# Patient Record
Sex: Male | Born: 1955 | Race: White | Hispanic: No | Marital: Married | Smoking: Never smoker
Health system: Southern US, Community
[De-identification: ages and names within clinical notes are randomized; demographics above are authoritative.]

## PROBLEM LIST (undated history)

## (undated) DIAGNOSIS — G4730 Sleep apnea, unspecified: Secondary | ICD-10-CM

## (undated) DIAGNOSIS — Z87898 Personal history of other specified conditions: Secondary | ICD-10-CM

## (undated) DIAGNOSIS — E781 Pure hyperglyceridemia: Secondary | ICD-10-CM

## (undated) DIAGNOSIS — E11329 Type 2 diabetes mellitus with mild nonproliferative diabetic retinopathy without macular edema: Secondary | ICD-10-CM

## (undated) DIAGNOSIS — I4891 Unspecified atrial fibrillation: Secondary | ICD-10-CM

## (undated) DIAGNOSIS — E669 Obesity, unspecified: Secondary | ICD-10-CM

## (undated) DIAGNOSIS — I10 Essential (primary) hypertension: Secondary | ICD-10-CM

## (undated) DIAGNOSIS — E119 Type 2 diabetes mellitus without complications: Secondary | ICD-10-CM

## (undated) DIAGNOSIS — T7840XA Allergy, unspecified, initial encounter: Secondary | ICD-10-CM

## (undated) HISTORY — DX: Essential (primary) hypertension: I10

## (undated) HISTORY — DX: Unspecified atrial fibrillation: I48.91

## (undated) HISTORY — DX: Pure hyperglyceridemia: E78.1

## (undated) HISTORY — DX: Type 2 diabetes mellitus with mild nonproliferative diabetic retinopathy without macular edema: E11.329

## (undated) HISTORY — DX: Sleep apnea, unspecified: G47.30

## (undated) HISTORY — DX: Obesity, unspecified: E66.9

## (undated) HISTORY — DX: Type 2 diabetes mellitus without complications: E11.9

## (undated) HISTORY — DX: Personal history of other specified conditions: Z87.898

## (undated) HISTORY — DX: Allergy, unspecified, initial encounter: T78.40XA

---

## 1976-11-28 HISTORY — PX: KNEE ARTHROTOMY: SUR107

## 1997-11-28 HISTORY — PX: NASAL SEPTOPLASTY W/ TURBINOPLASTY: SHX2070

## 1999-01-19 ENCOUNTER — Other Ambulatory Visit: Admission: RE | Admit: 1999-01-19 | Discharge: 1999-01-19 | Payer: Self-pay | Admitting: Otolaryngology

## 2002-01-04 ENCOUNTER — Encounter: Payer: Self-pay | Admitting: Gastroenterology

## 2002-01-04 ENCOUNTER — Ambulatory Visit (HOSPITAL_COMMUNITY): Admission: RE | Admit: 2002-01-04 | Discharge: 2002-01-04 | Payer: Self-pay | Admitting: Gastroenterology

## 2003-11-29 HISTORY — PX: OTHER SURGICAL HISTORY: SHX169

## 2014-05-29 ENCOUNTER — Encounter: Payer: Self-pay | Admitting: *Deleted

## 2014-05-29 ENCOUNTER — Ambulatory Visit (INDEPENDENT_AMBULATORY_CARE_PROVIDER_SITE_OTHER): Payer: 59 | Admitting: Cardiology

## 2014-05-29 VITALS — BP 140/96 | HR 74 | Wt 236.0 lb

## 2014-05-29 DIAGNOSIS — I480 Paroxysmal atrial fibrillation: Secondary | ICD-10-CM

## 2014-05-29 DIAGNOSIS — E785 Hyperlipidemia, unspecified: Secondary | ICD-10-CM

## 2014-05-29 DIAGNOSIS — I10 Essential (primary) hypertension: Secondary | ICD-10-CM

## 2014-05-29 DIAGNOSIS — I4891 Unspecified atrial fibrillation: Secondary | ICD-10-CM

## 2014-05-29 DIAGNOSIS — G4733 Obstructive sleep apnea (adult) (pediatric): Secondary | ICD-10-CM

## 2014-05-29 LAB — BASIC METABOLIC PANEL
BUN: 19 mg/dL (ref 6–23)
CO2: 29 mEq/L (ref 19–32)
Calcium: 9.6 mg/dL (ref 8.4–10.5)
Chloride: 100 mEq/L (ref 96–112)
Creatinine, Ser: 1.2 mg/dL (ref 0.4–1.5)
GFR: 63.7 mL/min (ref >60.00)
Glucose, Bld: 111 mg/dL — ABNORMAL HIGH (ref 70–99)
Potassium: 3.4 mEq/L — ABNORMAL LOW (ref 3.5–5.1)
Sodium: 139 mEq/L (ref 135–145)

## 2014-05-29 LAB — LIPID PANEL
Cholesterol: 161 mg/dL (ref 0–200)
HDL: 42.4 mg/dL (ref >39.00)
LDL Cholesterol: 78 mg/dL (ref 0–99)
NonHDL: 118.6
Total CHOL/HDL Ratio: 4
Triglycerides: 205 mg/dL — ABNORMAL HIGH (ref 0.0–149.0)
VLDL: 41 mg/dL — ABNORMAL HIGH (ref 0.0–40.0)

## 2014-05-29 LAB — CBC WITH DIFFERENTIAL/PLATELET
Basophils Absolute: 0 10*3/uL (ref 0.0–0.1)
Basophils Relative: 0.5 % (ref 0.0–3.0)
Eosinophils Absolute: 0.2 10*3/uL (ref 0.0–0.7)
Eosinophils Relative: 1.9 % (ref 0.0–5.0)
HCT: 41 % (ref 39.0–52.0)
Hemoglobin: 13.9 g/dL (ref 13.0–17.0)
Lymphocytes Relative: 43 % (ref 12.0–46.0)
Lymphs Abs: 3.6 10*3/uL (ref 0.7–4.0)
MCHC: 33.9 g/dL (ref 30.0–36.0)
MCV: 92.4 fl (ref 78.0–100.0)
Monocytes Absolute: 0.6 10*3/uL (ref 0.1–1.0)
Monocytes Relative: 7 % (ref 3.0–12.0)
Neutro Abs: 4 10*3/uL (ref 1.4–7.7)
Neutrophils Relative %: 47.6 % (ref 43.0–77.0)
Platelets: 269 10*3/uL (ref 150.0–400.0)
RBC: 4.44 Mil/uL (ref 4.22–5.81)
RDW: 13.5 % (ref 11.5–15.5)
WBC: 8.3 10*3/uL (ref 4.0–10.5)

## 2014-05-29 LAB — MAGNESIUM: Magnesium: 1.9 mg/dL (ref 1.5–2.5)

## 2014-05-29 MED ORDER — APIXABAN 5 MG PO TABS: 5.0000 mg | ORAL_TABLET | Freq: Two times a day (BID) | ORAL | Status: DC

## 2014-05-29 NOTE — Patient Instructions (Signed)
You can change from Xarelto to Eliquis 5 mg two times a day. Take Eliquis the next day after you take your last Xarelto.   Your physician recommends that you return for lab work today--BMET/Magnesium/CBCd/Lipid profile.  Your physician wants you to follow-up in: 6 months with Dr Shirlee LatchMcLean. (January 2016).  You will receive a reminder letter in the mail two months in advance. If you don't receive a letter, please call our office to schedule the follow-up appointment.

## 2014-05-30 DIAGNOSIS — I480 Paroxysmal atrial fibrillation: Secondary | ICD-10-CM | POA: Insufficient documentation

## 2014-05-30 DIAGNOSIS — E785 Hyperlipidemia, unspecified: Secondary | ICD-10-CM | POA: Insufficient documentation

## 2014-05-30 DIAGNOSIS — I10 Essential (primary) hypertension: Secondary | ICD-10-CM | POA: Insufficient documentation

## 2014-05-30 DIAGNOSIS — G4733 Obstructive sleep apnea (adult) (pediatric): Secondary | ICD-10-CM | POA: Insufficient documentation

## 2014-05-30 NOTE — Progress Notes (Signed)
Patient ID: George Carr, male   DOB: September 21, 1956, 58 y.o.   MRN: [id number] PCP: Dr. Leonette MostKalish [facility]  58 yo with history of type II diabetes, HTN, and paroxysmal atrial fibrillation.  Atrial fibrillation was first noted in 2/15 when he went for a colonoscopy.  He was unaware of it, no symptoms.  He was started on sotalol and Xarelto.  He went back into NSR and is in NSR today.  He denies exertional dyspnea or chest pain.  He has good exercise tolerance.  He tries to walk for exercise as much as he can.  He has OSA and is using CPAP.  Echo was done in 3/15, showing preserved LV systolic function.  Lexiscan Cardiolite was done in 3/15 showing no ischemia.  He says that Xarelto is quite expensive for him.    ECG: NSR, nonspecific inferior T wave flattening, QTc 468 msec  PMH:  1. Type II diabetes 2. HTN 3. Obesity 4. Hyperlipidemia 5. Atrial fibrillation: paroxysmal.  1st noted in 2/15 when he went for a colonoscopy.  He was started on sotalol and Xarelto.  Echo (3/15) with EF 60%, mild LVH, mild AI.   6. OSA on CPAP 7. Lexiscan Cardiolite (3/15) with EF 59%, no ischemia.   FH: Father with CAD, atrial fibrillation.   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    ROS: All systems reviewed and negative except as per HPI.   Current Outpatient Prescriptions  Medication Sig Dispense Refill  . apixaban (ELIQUIS) 5 MG TABS tablet Take 1 tablet (5 mg total) by mouth 2 (two) times daily.  60 tablet  3  . lisinopril-hydrochlorothiazide (PRINZIDE,ZESTORETIC) 20-12.5 MG per tablet Take 2 tablets by mouth daily.      . metFORMIN (GLUCOPHAGE-XR) 500 MG 24 hr tablet Take 2,000 mg by mouth daily with breakfast.      . sotalol (BETAPACE) 80 MG tablet Take 80 mg by mouth 2 (two) times daily.       No current facility-administered medications for this visit.   BP 140/96  Pulse 74  Wt 107.049 kg (236 lb) General: NAD, overweight Neck: Thick, no JVD, no thyromegaly or  thyroid nodule.  Lungs: Clear to auscultation bilaterally with normal respiratory effort. CV: Nondisplaced PMI.  Heart regular S1/S2, no S3/S4, no murmur.  No peripheral edema.  No carotid bruit.  Normal pedal pulses.  Abdomen: Soft, nontender, no hepatosplenomegaly, no distention.  Skin: Intact without lesions or rashes.  Neurologic: Alert and oriented x 3.  Psych: Normal affect. Extremities: No clubbing or cyanosis.  HEENT: Normal.   Assessment/Plan: 1. Paroxysmal atrial fibrillation: Patient is in NSR today on sotalol.  He was not particularly symptomatic when in atrial fibrillation.  Multiple atrial fibrillation risk factors including HTN, diabetes, and OSA.  - Continue sotalol for NSR maintenance.  QT interval mildly prolonged today but ok.  Will check K and Mg today.   - Xarelto is expensive for him.  We checked with his insurance, and Eliquis is significantly cheaper.  I will have him transition to Eliquis.   2. HTN: BP mildly elevated today, will follow for now but may need to add amlodipine.  3. Hyperlipidemia: Probably would benefit from a statin.  I will check lipids today and decide.   4. OSA: I encouraged CPAP use as this can help lower risk of atrial fibrillation recurrence potentially.   Marca AnconaDalton McLean 05/30/2014

## 2014-06-02 ENCOUNTER — Other Ambulatory Visit: Payer: Self-pay | Admitting: *Deleted

## 2014-06-02 DIAGNOSIS — E785 Hyperlipidemia, unspecified: Secondary | ICD-10-CM

## 2014-06-02 DIAGNOSIS — Z79899 Other long term (current) drug therapy: Secondary | ICD-10-CM

## 2014-06-02 DIAGNOSIS — I10 Essential (primary) hypertension: Secondary | ICD-10-CM

## 2014-06-02 MED ORDER — POTASSIUM CHLORIDE CRYS ER 20 MEQ PO TBCR: 20.0000 meq | EXTENDED_RELEASE_TABLET | Freq: Every day | ORAL | Status: DC

## 2014-06-02 MED ORDER — ATORVASTATIN CALCIUM 10 MG PO TABS: ORAL_TABLET | ORAL | Status: DC

## 2014-07-25 ENCOUNTER — Ambulatory Visit (INDEPENDENT_AMBULATORY_CARE_PROVIDER_SITE_OTHER): Payer: 59 | Admitting: Cardiology

## 2014-07-25 ENCOUNTER — Encounter: Payer: Self-pay | Admitting: Cardiology

## 2014-07-25 VITALS — BP 116/72 | HR 76 | Ht 72.0 in | Wt 235.0 lb

## 2014-07-25 DIAGNOSIS — R55 Syncope and collapse: Secondary | ICD-10-CM

## 2014-07-25 DIAGNOSIS — G4733 Obstructive sleep apnea (adult) (pediatric): Secondary | ICD-10-CM

## 2014-07-25 DIAGNOSIS — I4891 Unspecified atrial fibrillation: Secondary | ICD-10-CM

## 2014-07-25 DIAGNOSIS — Z7901 Long term (current) use of anticoagulants: Secondary | ICD-10-CM

## 2014-07-25 DIAGNOSIS — I480 Paroxysmal atrial fibrillation: Secondary | ICD-10-CM

## 2014-07-25 LAB — BASIC METABOLIC PANEL
BUN: 17 mg/dL (ref 6–23)
CHLORIDE: 101 meq/L (ref 96–112)
CO2: 32 meq/L (ref 19–32)
CREATININE: 0.9 mg/dL (ref 0.4–1.5)
Calcium: 9.7 mg/dL (ref 8.4–10.5)
GFR: 89.85 mL/min (ref >60.00)
Glucose, Bld: 130 mg/dL — ABNORMAL HIGH (ref 70–99)
Potassium: 3.7 mEq/L (ref 3.5–5.1)
Sodium: 139 mEq/L (ref 135–145)

## 2014-07-25 LAB — CBC WITH DIFFERENTIAL/PLATELET
BASOS PCT: 0.6 % (ref 0.0–3.0)
Basophils Absolute: 0 10*3/uL (ref 0.0–0.1)
EOS ABS: 0.1 10*3/uL (ref 0.0–0.7)
Eosinophils Relative: 1.9 % (ref 0.0–5.0)
HCT: 43.7 % (ref 39.0–52.0)
Hemoglobin: 14.5 g/dL (ref 13.0–17.0)
LYMPHS PCT: 37.9 % (ref 12.0–46.0)
Lymphs Abs: 2.9 10*3/uL (ref 0.7–4.0)
MCHC: 33.3 g/dL (ref 30.0–36.0)
MCV: 93 fl (ref 78.0–100.0)
MONOS PCT: 8 % (ref 3.0–12.0)
Monocytes Absolute: 0.6 10*3/uL (ref 0.1–1.0)
NEUTROS PCT: 51.6 % (ref 43.0–77.0)
Neutro Abs: 3.9 10*3/uL (ref 1.4–7.7)
Platelets: 248 10*3/uL (ref 150.0–400.0)
RBC: 4.7 Mil/uL (ref 4.22–5.81)
RDW: 13.1 % (ref 11.5–15.5)
WBC: 7.6 10*3/uL (ref 4.0–10.5)

## 2014-07-25 NOTE — Patient Instructions (Signed)
**Note De-Identified Maylea Soria Obfuscation** Your physician recommends that you continue on your current medications as directed. Please refer to the Current Medication list given to you today.  Your physician has recommended that you wear an event monitor for 21 days. Event monitors are medical devices that record the heart's electrical activity. Doctors most often [name] these monitors to diagnose arrhythmias. Arrhythmias are problems with the speed or rhythm of the heartbeat. The monitor is a small, portable device. You can wear one while you do your normal daily activities. This is usually used to diagnose what is causing palpitations/syncope (passing out).  Your physician recommends that you schedule a follow-up appointment in: we will contact you concerning your follow up appointment.

## 2014-07-25 NOTE — Assessment & Plan Note (Addendum)
Today is the first time his atrial fibrillation has been documented since his event earlier this year before he was placed on sotalol. We really do not know if he goes in and out of atrial fibrillation. Event recorder will be done to assess this further.

## 2014-07-25 NOTE — Assessment & Plan Note (Signed)
The patient is anticoagulated. This will be continued.

## 2014-07-25 NOTE — Assessment & Plan Note (Signed)
The patient had an episode of sudden weakness and slumping to the floor. He did not have to syncope. He did not feel any palpitations. He did not have chest pain. EKG reveals atrial fib. This is the first time this has been documented since he spontaneously converted earlier this year on sotalol. He has normal LV function and no ischemia by nuclear scan within the past 6 months. He on the the vent today is not clear. It is possible that he could have pauses going in and out of atrial fibrillation. It is also possible that he could have a drug related proarrhythmia. We know that his glucose was stable today. Chemistry will be checked immediately today. In addition a 21 day event recorder will be placed. He knows to contact us if he has any recurring symptoms. I will arrange for very early followup with Dr. Shirlee Latch. It is possible that cardioversion might be considered if he still has atrial fibrillation.

## 2014-07-25 NOTE — Progress Notes (Signed)
Patient ID: George Carr, male   DOB: 1956/03/06, 58 y.o.   MRN: [id number]    HPI  Patient is seen today as an add-on. His primary care physician called me about him today. The patient is known to Dr. Shirlee Latch. He was seen in the office last May 29, 2014. He has diabetes hypertension and paroxysmal atrial fibrillation. He has been treated with sotalol. He is also on Eliquis. He has obstructive sleep apnea on CPAP. He has had an echo and a nuclear scan in the last 6 months. These showed no ischemia. There is good LV function.  Today he was feeling well. He ate breakfast. He stood up to walk across the kitchen. He noticed that he he did not feel right and went to hold on to a structure but weekend and felt himself fall to the floor. He did not have complete syncope. He knew that he was on the floor. Within a minute or 2 he felt fine. He did not notice any significant palpitations. There was no chest pain.  Allergies  Allergen Reactions  . Penicillins     Current Outpatient Prescriptions  Medication Sig Dispense Refill  . apixaban (ELIQUIS) 5 MG TABS tablet Take 1 tablet (5 mg total) by mouth 2 (two) times daily.  60 tablet  3  . atorvastatin (LIPITOR) 10 MG tablet Pt is to take 5 mg (1/2 tablet) by mouth once daily.  30 tablet  3  . lisinopril-hydrochlorothiazide (PRINZIDE,ZESTORETIC) 20-12.5 MG per tablet Take 2 tablets by mouth daily.      . metFORMIN (GLUCOPHAGE-XR) 500 MG 24 hr tablet Take 2,000 mg by mouth daily with breakfast.      . potassium chloride SA (K-DUR,KLOR-CON) 20 MEQ tablet Take 1 tablet (20 mEq total) by mouth daily.  30 tablet  6  . sotalol (BETAPACE) 80 MG tablet Take 80 mg by mouth 2 (two) times daily.       No current facility-administered medications for this visit.    History   Social History  . Marital Status: Married    Spouse Name: N/A    Number of Children: N/A  . Years of Education: N/A   Occupational History  . Not on file.   Social History Main  Topics  . Smoking status: Passive Smoke Exposure - Never Smoker  . Smokeless tobacco: Not on file  . Alcohol Use: Yes     Comment: seldom  . Drug Use: No  . Sexual Activity: Not on file   Other Topics Concern  . Not on file   Social History Narrative  . No narrative on file    Family History  Problem Relation Age of Onset  . Diabetes    . Hypertension Father   . Other Father     pat/mat uncles, malignant neoplasm of the prostate gland  . Stroke Father     Past Medical History  Diagnosis Date  . Atrial fibrillation   . Essential hypertension, benign   . Type II or unspecified type diabetes mellitus without mention of complication, not stated as uncontrolled   . 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 Obesity, unspecified   . Pure hyperglyceridemia   . Personal history of other specified diseases(V13.89)     Past Surgical History  Procedure Laterality Date  . Knee arthrotomy Right 1978  . Cornea radial keratotomy Bilateral 2005  . Nasal septoplasty w/ turbinoplasty  1999    Patient Active Problem List   Diagnosis Date Noted  .  Chronic anticoagulation 07/25/2014  . Paroxysmal atrial fibrillation 05/30/2014  . Essential hypertension 05/30/2014  . Hyperlipidemia 05/30/2014  . OSA (obstructive sleep apnea) 05/30/2014    ROS   Patient denies fever, chills, headache, sweats, rash, change in vision, change in hearing, chest pain, cough, nausea vomiting, urinary symptoms. All other systems are reviewed and are negative.  PHYSICAL EXAM  Patient is oriented to person time and place. Affect is normal. There is no jugulovenous distention. Head is atraumatic. Sclera and conjunctiva are normal. Lungs are clear. Respiratory effort is nonlabored. Cardiac exam reveals S1 and S2. The abdomen is soft. There is no peripheral edema. There no musculoskeletal deformities. There are no skin rashes. The rhythm is irregularly irregular.  Filed Vitals:   07/25/14 1432    BP: 116/72  Pulse: 76  Height: 6' (1.829 m)  Weight: 235 lb (106.595 kg)   I have reviewed the EKG done at his primary care office. There is atrial fibrillation. Rate is 95-100.  ASSESSMENT & PLAN

## 2014-07-28 ENCOUNTER — Encounter (INDEPENDENT_AMBULATORY_CARE_PROVIDER_SITE_OTHER): Payer: 59

## 2014-07-28 ENCOUNTER — Encounter: Payer: Self-pay | Admitting: *Deleted

## 2014-07-28 DIAGNOSIS — I4891 Unspecified atrial fibrillation: Secondary | ICD-10-CM

## 2014-07-28 DIAGNOSIS — R55 Syncope and collapse: Secondary | ICD-10-CM

## 2014-07-28 DIAGNOSIS — R42 Dizziness and giddiness: Secondary | ICD-10-CM

## 2014-07-28 NOTE — Progress Notes (Signed)
Patient ID: George Carr, male   DOB: 11/30/1955, 58 y.o.   MRN: [id number] E-Cardio verite 21 day cardiac event monitor applied to patient.

## 2014-08-01 ENCOUNTER — Encounter: Payer: Self-pay | Admitting: Cardiology

## 2014-08-01 ENCOUNTER — Ambulatory Visit (INDEPENDENT_AMBULATORY_CARE_PROVIDER_SITE_OTHER): Payer: 59 | Admitting: Cardiology

## 2014-08-01 VITALS — BP 138/90 | HR 62 | Ht 72.0 in | Wt 237.8 lb

## 2014-08-01 DIAGNOSIS — E785 Hyperlipidemia, unspecified: Secondary | ICD-10-CM

## 2014-08-01 DIAGNOSIS — I10 Essential (primary) hypertension: Secondary | ICD-10-CM

## 2014-08-01 DIAGNOSIS — I480 Paroxysmal atrial fibrillation: Secondary | ICD-10-CM

## 2014-08-01 DIAGNOSIS — R55 Syncope and collapse: Secondary | ICD-10-CM

## 2014-08-01 DIAGNOSIS — G4733 Obstructive sleep apnea (adult) (pediatric): Secondary | ICD-10-CM

## 2014-08-01 DIAGNOSIS — I4891 Unspecified atrial fibrillation: Secondary | ICD-10-CM

## 2014-08-01 NOTE — Patient Instructions (Signed)
Your physician recommends that you schedule a follow-up appointment in: 1 month with Dr. Shirlee Latch.

## 2014-08-03 NOTE — Progress Notes (Signed)
Patient ID: George Carr, male   DOB: 10/28/1956, 58 y.o.   MRN: [id number] PCP: Dr. Leonette Most [facility])  58 yo with history of type II diabetes, HTN, and paroxysmal atrial fibrillation.  Atrial fibrillation was first noted in 2/15 when he went for a colonoscopy.  He was unaware of it, no symptoms.  He was started on sotalol and Xarelto, later switched to Eliquis.  He went back into NSR.  He denies exertional dyspnea or chest pain.  He has good exercise tolerance.  He tries to walk for exercise as much as he can.  He has OSA and is using CPAP.  Echo was done in 3/15, showing preserved LV systolic function.  Lexiscan Cardiolite was done in 3/15 showing no ischemia.     Patient was doing well until 8/28.  On that day, he had a "weak spell" while walking through his kitchen.  He did not pass out completely but felt weak and lightheaded and slumped down to the floor.  He did not feel chest pain or palpitations.  He felt back to normal after about 30 seconds.  He has not had any further episodes like this.  He was seen in this office in on 8/28 after the "spell."  He was noted to be in atrial fibrillation with rate control.  He did not feel the atrial fibrillation.  Today, he is back in NSR.  He cannot tell when he went back into NSR.  He was set up for a 3 week monitor which he is wearing today.   ECG: NSR, nonspecific T wave flattening, QTc normal  Labs (8/15): K 3.7, creatinine 0.9, LDL 78, HDL 42  PMH:  1. Type II diabetes 2. HTN 3. Obesity 4. Hyperlipidemia 5. Atrial fibrillation: paroxysmal.  1st noted in 2/15 when he went for a colonoscopy.  He was started on sotalol and Xarelto.  Echo (3/15) with EF 60%, mild LVH, mild AI.   6. OSA on CPAP 7. Lexiscan Cardiolite (3/15) with EF 59%, no ischemia.  8. Presyncope (8/15)  FH: Father with CAD, atrial fibrillation.   SH: Married, 1 son, lives in [place], [place], works in Airline pilot.    ROS: All systems reviewed and negative except as per  HPI.   Current Outpatient Prescriptions  Medication Sig Dispense Refill  . apixaban (ELIQUIS) 5 MG TABS tablet Take 1 tablet (5 mg total) by mouth 2 (two) times daily.  60 tablet  3  . atorvastatin (LIPITOR) 10 MG tablet Pt is to take 5 mg (1/2 tablet) by mouth once daily.  30 tablet  3  . lisinopril-hydrochlorothiazide (PRINZIDE,ZESTORETIC) 20-12.5 MG per tablet Take 2 tablets by mouth daily.      . metFORMIN (GLUCOPHAGE-XR) 500 MG 24 hr tablet Take 2,000 mg by mouth daily with breakfast.      . potassium chloride SA (K-DUR,KLOR-CON) 20 MEQ tablet Take 1 tablet (20 mEq total) by mouth daily.  30 tablet  6  . sotalol (BETAPACE) 80 MG tablet Take 80 mg by mouth 2 (two) times daily.       No current facility-administered medications for this visit.   BP 138/90  Pulse 62  Ht 6' (1.829 m)  Wt 237 lb 12.8 oz (107.865 kg)  BMI 32.24 kg/m2 General: NAD, overweight Neck: Thick, no JVD, no thyromegaly or thyroid nodule.  Lungs: Clear to auscultation bilaterally with normal respiratory effort. CV: Nondisplaced PMI.  Heart regular S1/S2, no S3/S4, no murmur.  No peripheral edema.  No carotid bruit.  Normal pedal pulses.  Abdomen: Soft, nontender, no hepatosplenomegaly, no distention.  Skin: Intact without lesions or rashes.  Neurologic: Alert and oriented x 3.  Psych: Normal affect. Extremities: No clubbing or cyanosis.   Assessment/Plan: 1. Paroxysmal atrial fibrillation: Patient is back in NSR today after recent recurrence of atrial fibrillation.  The presyncopal spell could be related to post-termination pauses when he goes from atrial fibrillation to NSR.  Multiple atrial fibrillation risk factors including HTN, diabetes, and OSA.  - Continue sotalol for NSR maintenance.  QTc is not prolonged today. - Continue Eliquis.  - If he has more breakthrough atrial fibrillation while on sotalol, can consider atrial fibrillation ablation.  2. HTN: BP controlled. 3. Hyperlipidemia: Continue  atorvastatin with lipids/LFTs in 2 months.    4. OSA: I encouraged CPAP use as this can help lower risk of atrial fibrillation recurrence potentially.  5. Presyncope: Patient had one rather severe presyncopal spell last week.  He was in atrial fibrillation when he came to the office afterwards, but rate was controlled and this did not explain the presyncope.  Possibilities include post-termination pause when going from atrial fibrillation to NSR or pro-arrhythmia from sotalol with torsades.  Electrolytes and QT interval by ECG, however, have been ok.  - Continue 3 week monitor.  No worrisome events so far.   Followup in 1 month after monitor is complete.   Marca Ancona 08/03/2014

## 2014-08-07 ENCOUNTER — Encounter: Payer: Self-pay | Admitting: Cardiology

## 2014-08-08 ENCOUNTER — Other Ambulatory Visit (INDEPENDENT_AMBULATORY_CARE_PROVIDER_SITE_OTHER): Payer: 59

## 2014-08-08 DIAGNOSIS — I10 Essential (primary) hypertension: Secondary | ICD-10-CM

## 2014-08-08 DIAGNOSIS — Z79899 Other long term (current) drug therapy: Secondary | ICD-10-CM

## 2014-08-08 DIAGNOSIS — E785 Hyperlipidemia, unspecified: Secondary | ICD-10-CM

## 2014-08-08 LAB — HEPATIC FUNCTION PANEL
ALBUMIN: 4.1 g/dL (ref 3.5–5.2)
ALK PHOS: 47 U/L (ref 39–117)
ALT: 28 U/L (ref 0–53)
AST: 21 U/L (ref 0–37)
Bilirubin, Direct: 0.1 mg/dL (ref 0.0–0.3)
Total Bilirubin: 0.7 mg/dL (ref 0.2–1.2)
Total Protein: 7.3 g/dL (ref 6.0–8.3)

## 2014-08-08 LAB — LIPID PANEL
Cholesterol: 105 mg/dL (ref 0–200)
HDL: 32.6 mg/dL — ABNORMAL LOW (ref >39.00)
LDL Cholesterol: 51 mg/dL (ref 0–99)
NonHDL: 72.4
TRIGLYCERIDES: 108 mg/dL (ref 0.0–149.0)
Total CHOL/HDL Ratio: 3
VLDL: 21.6 mg/dL (ref 0.0–40.0)

## 2014-08-08 LAB — BASIC METABOLIC PANEL
BUN: 14 mg/dL (ref 6–23)
CALCIUM: 9.4 mg/dL (ref 8.4–10.5)
CO2: 29 mEq/L (ref 19–32)
CREATININE: 0.8 mg/dL (ref 0.4–1.5)
Chloride: 100 mEq/L (ref 96–112)
GFR: 104.06 mL/min (ref >60.00)
Glucose, Bld: 112 mg/dL — ABNORMAL HIGH (ref 70–99)
Potassium: 3.9 mEq/L (ref 3.5–5.1)
Sodium: 137 mEq/L (ref 135–145)

## 2014-08-11 ENCOUNTER — Telehealth: Payer: Self-pay | Admitting: Cardiology

## 2014-08-11 NOTE — Telephone Encounter (Signed)
Follow up;   Per pt calling this office back for his lab results.

## 2014-08-11 NOTE — Telephone Encounter (Signed)
Spoke with patient about recent lab results 

## 2014-08-12 ENCOUNTER — Telehealth: Payer: Self-pay | Admitting: *Deleted

## 2014-08-12 NOTE — Telephone Encounter (Signed)
Run of At Fib 08/10/14 11:30 AM (CT) per Dr Shirlee Latch  Refer to EP for ablation consideration.   Pt advised, verbalized understanding.

## 2014-09-08 ENCOUNTER — Ambulatory Visit (INDEPENDENT_AMBULATORY_CARE_PROVIDER_SITE_OTHER): Payer: 59 | Admitting: Internal Medicine

## 2014-09-08 ENCOUNTER — Encounter: Payer: Self-pay | Admitting: Internal Medicine

## 2014-09-08 VITALS — BP 146/98 | HR 60 | Ht 72.0 in | Wt 235.0 lb

## 2014-09-08 DIAGNOSIS — G4733 Obstructive sleep apnea (adult) (pediatric): Secondary | ICD-10-CM

## 2014-09-08 DIAGNOSIS — R55 Syncope and collapse: Secondary | ICD-10-CM

## 2014-09-08 DIAGNOSIS — I10 Essential (primary) hypertension: Secondary | ICD-10-CM

## 2014-09-08 DIAGNOSIS — I480 Paroxysmal atrial fibrillation: Secondary | ICD-10-CM

## 2014-09-08 MED ORDER — SOTALOL HCL 80 MG PO TABS: 120.0000 mg | ORAL_TABLET | Freq: Two times a day (BID) | ORAL | Status: DC

## 2014-09-08 MED ORDER — LISINOPRIL 20 MG PO TABS: 20.0000 mg | ORAL_TABLET | Freq: Every day | ORAL | Status: DC

## 2014-09-08 NOTE — Patient Instructions (Signed)
Your physician recommends that you schedule a follow-up appointment in: Mon in nurse room for EKG and BP check.  Your physician recommends that you schedule a follow-up appointment in: 3-4 weeks in afib clinic, keep appointment wit Dr Shirlee LatchMclean as scheduled and follow up with Dr Johney FrameAllred as needed   Your physician has recommended you make the following change in your medication:  1) on 09/12/14 increase Sotalol to 1 1/2 tablets twice daily 2) Stop Lisinopril/HCTZ 3) Start Lisinopril 20mg  daily

## 2014-09-08 NOTE — Progress Notes (Signed)
Primary Care Physician: Sid FalconKALISH, MICHAEL J, MD Referring Physician:  Dr Lawerance BachMcLean   George Carr is a 58 y.o. male with a h/o 58 yo with history of type II diabetes, HTN, and paroxysmal atrial fibrillation. Atrial fibrillation was first noted in 2/15 when he went for a colonoscopy. He was unaware of it, no symptoms. He was started on sotalol and Xarelto. He went back into NSR .  He had an episode a couple of weeks ago when he was walking around in the am, after eating breakfast, got dizzy and fell to the floor, did not have LOC. He went to the doctor and ekg showed afib at 108 bpm. He is unaware of heart beat and does not feel palpitations. He wore a monitor wjich showed some PAF which again pt is asymptomatic. He reports some dizziness at work recently but had just been told he had to assume a new position.Otherwise, he reports good energy, no exertional chest pain or shortness of breath. He continues on sotalol and Noac with chadsvasc of at least 2. EKG shows NSR at 60 bpm, QTc at 450ms.  Today, he denies symptoms of palpitations, chest pain, shortness of breath, orthopnea, PND, lower extremity edema, dizziness, presyncope, syncope, or neurologic sequela. The patient is tolerating medications without difficulties and is otherwise without complaint today.   Past Medical History  Diagnosis Date  . Atrial fibrillation   . Essential hypertension, benign   . Type II or unspecified type diabetes mellitus without mention of complication, not stated as uncontrolled   . 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 Obesity, unspecified   . Pure hyperglyceridemia   . Personal history of other specified diseases(V13.89)    Past Surgical History  Procedure Laterality Date  . Knee arthrotomy Right 1978  . Cornea radial keratotomy Bilateral 2005  . Nasal septoplasty w/ turbinoplasty  1999    Current Outpatient Prescriptions  Medication Sig Dispense Refill  . apixaban (ELIQUIS) 5 MG  TABS tablet Take 1 tablet (5 mg total) by mouth 2 (two) times daily.  60 tablet  3  . atorvastatin (LIPITOR) 10 MG tablet Pt is to take 5 mg (1/2 tablet) by mouth once daily.  30 tablet  3  . metFORMIN (GLUCOPHAGE-XR) 500 MG 24 hr tablet Take 2,000 mg by mouth daily with breakfast.      . potassium chloride SA (K-DUR,KLOR-CON) 20 MEQ tablet Take 1 tablet (20 mEq total) by mouth daily.  30 tablet  6  . sotalol (BETAPACE) 80 MG tablet Take 1.5 tablets (120 mg total) by mouth 2 (two) times daily.  270 tablet  3  . lisinopril (PRINIVIL,ZESTRIL) 20 MG tablet Take 1 tablet (20 mg total) by mouth daily.  90 tablet  3   No current facility-administered medications for this visit.    Allergies  Allergen Reactions  . Penicillins Hives    History   Social History  . Marital Status: Married    Spouse Name: N/A    Number of Children: N/A  . Years of Education: N/A   Occupational History  . Not on file.   Social History Main Topics  . Smoking status: Passive Smoke Exposure - Never Smoker  . Smokeless tobacco: Not on file  . Alcohol Use: Yes     Comment: seldom  . Drug Use: No  . Sexual Activity: Not on file   Other Topics Concern  . Not on file   Social History Narrative  . No narrative  on file    Family History  Problem Relation Age of Onset  . Diabetes    . Hypertension Father   . Other Father     pat/mat uncles, malignant neoplasm of the prostate gland  . Stroke Father     ROS- All systems are reviewed and negative except as per the HPI above  Physical Exam: Filed Vitals:   09/08/14 0821  BP: 146/98  Pulse: 60  Height: 6' (1.829 m)  Weight: 235 lb (106.595 kg)    GEN- The patient is well appearing, alert and oriented x 3 today.   Head- normocephalic, atraumatic Eyes-  Sclera clear, conjunctiva pink Ears- hearing intact Oropharynx- clear Neck- supple, no JVP Lymph- no cervical lymphadenopathy Lungs- Clear to ausculation bilaterally, normal work of  breathing Heart- Regular rate and rhythm, no murmurs, rubs or gallops, PMI not laterally displaced GI- soft, NT, ND, + BS Extremities- no clubbing, cyanosis, or edema MS- no significant deformity or atrophy Skin- no rash or lesion Psych- euthymic mood, full affect Neuro- strength and sensation are intact  EKG- reviewed Epic records including Dr Kathlyn SacramentoMcLeans notes are reviewed  Assessment and Plan:  Afib,  1. asymtomatic .Increase sotalol to 120 mg bid staring this Friday pm and report to the office on Monday am for EKG.   2. Stop HCTZ, dizziness may be related to postural hypotension. BP to be reevaluated on Monday am as well.  3. F/u 3 weeks in afib clinic.   I have seen, examined the patient, and reviewed the above assessment and plan with [name] NP.  Changes to above are made where necessary.   The patient appears to be minimally symptomatic with his afib.  As the indication for AF ablation is symptoms, I am not certain that he is presently a candidate for the procedure. His syncope and recurrent postural dizziness were likely due to hypotension.  I will stop hctz which is the likely cause. Increase sotalol for afib management and return within 48 hours of increase to evaluate QTc. Continue long term anticoagulation with eliquis.  Weight loss and compliance with CPAP are encouraged. Follow-up with [name] NP in the AF clinic.  Co Sign: Hillis RangeAllred, Rutha Melgoza, MD 09/08/2014 10:41 PM

## 2014-09-11 ENCOUNTER — Ambulatory Visit: Payer: 59 | Admitting: Cardiology

## 2014-09-15 ENCOUNTER — Ambulatory Visit (INDEPENDENT_AMBULATORY_CARE_PROVIDER_SITE_OTHER): Payer: 59

## 2014-09-15 VITALS — BP 142/90 | HR 66 | Wt 234.1 lb

## 2014-09-15 DIAGNOSIS — I10 Essential (primary) hypertension: Secondary | ICD-10-CM

## 2014-09-15 NOTE — Progress Notes (Signed)
[name] in the office today for BP check.  Medications and allergies confirmed. BP= 142/90, HR=66.  No complaints or symptoms reported at this time.   Routing to [place] Mountain[place]Lean for review.

## 2014-10-01 ENCOUNTER — Other Ambulatory Visit: Payer: Self-pay

## 2014-10-01 DIAGNOSIS — I4891 Unspecified atrial fibrillation: Secondary | ICD-10-CM

## 2014-10-01 MED ORDER — APIXABAN 5 MG PO TABS: 5.0000 mg | ORAL_TABLET | Freq: Two times a day (BID) | ORAL | Status: DC

## 2014-10-03 ENCOUNTER — Ambulatory Visit (HOSPITAL_COMMUNITY): Payer: 59 | Admitting: Nurse Practitioner

## 2014-10-14 ENCOUNTER — Ambulatory Visit (HOSPITAL_COMMUNITY): Payer: 59 | Admitting: Nurse Practitioner

## 2014-10-21 ENCOUNTER — Encounter (HOSPITAL_COMMUNITY): Payer: Self-pay | Admitting: Nurse Practitioner

## 2014-10-21 ENCOUNTER — Ambulatory Visit (HOSPITAL_COMMUNITY)
Admission: RE | Admit: 2014-10-21 | Discharge: 2014-10-21 | Disposition: A | Discharge status: Home / Self Care | Payer: 59 | Source: Ambulatory Visit | Attending: Nurse Practitioner | Admitting: Nurse Practitioner

## 2014-10-21 VITALS — BP 140/88 | HR 70 | Resp 14 | Ht 72.0 in | Wt 238.0 lb

## 2014-10-21 DIAGNOSIS — I10 Essential (primary) hypertension: Secondary | ICD-10-CM | POA: Insufficient documentation

## 2014-10-21 DIAGNOSIS — I4891 Unspecified atrial fibrillation: Secondary | ICD-10-CM | POA: Insufficient documentation

## 2014-10-21 DIAGNOSIS — E669 Obesity, unspecified: Secondary | ICD-10-CM | POA: Insufficient documentation

## 2014-10-21 DIAGNOSIS — E119 Type 2 diabetes mellitus without complications: Secondary | ICD-10-CM | POA: Diagnosis not present

## 2014-10-21 DIAGNOSIS — I480 Paroxysmal atrial fibrillation: Secondary | ICD-10-CM

## 2014-10-21 NOTE — Progress Notes (Signed)
Primary Care Physician: Sid FalconKALISH, MICHAEL J, MD Referring Physician:  Dr Lawerance BachMcLean   George Carr is a 58 y.o. male with a h/o 58 yo with history of type II diabetes, HTN, and paroxysmal atrial fibrillation. Atrial fibrillation was first noted in 2/15 when he went for a colonoscopy. He was unaware of it, no symptoms. He was started on sotalol and Xarelto. He went back into NSR .   He had an episode a couple of weeks ago when he was walking around in the am, after eating breakfast, got dizzy and fell to the floor, did not have LOC. He went to the doctor and ekg showed afib at 108 bpm. He is unaware of heart beat and does not feel palpitations. He wore a monitor wjich showed some PAF which again pt is asymptomatic. Sotalol was increased to 120 mg bid and he returns today feeling well, not aware of any irregular heartbeat or further dizziness. Had a physical with his MD this week and labs were normal per patient. (NOT IN EPIC). Stated his MD was very pleased with lab results. EKG does reveal SR. Wearing Cpap consistently.  Today, he denies symptoms of palpitations, chest pain, shortness of breath, orthopnea, PND, lower extremity edema, dizziness, presyncope, syncope, or neurologic sequela. The patient is tolerating medications without difficulties and is otherwise without complaint today.   Past Medical History  Diagnosis Date  . Atrial fibrillation   . Essential hypertension, benign   . Type II or unspecified type diabetes mellitus without mention of complication, not stated as uncontrolled   . 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 Obesity, unspecified   . Pure hyperglyceridemia   . Personal history of other specified diseases(V13.89)    Past Surgical History  Procedure Laterality Date  . Knee arthrotomy Right 1978  . Cornea radial keratotomy Bilateral 2005  . Nasal septoplasty w/ turbinoplasty  1999    Current Outpatient Prescriptions  Medication Sig Dispense  Refill  . apixaban (ELIQUIS) 5 MG TABS tablet Take 1 tablet (5 mg total) by mouth 2 (two) times daily. 60 tablet 10  . atorvastatin (LIPITOR) 10 MG tablet Pt is to take 5 mg (1/2 tablet) by mouth once daily. 30 tablet 3  . lisinopril (PRINIVIL,ZESTRIL) 20 MG tablet Take 1 tablet (20 mg total) by mouth daily. 90 tablet 3  . metFORMIN (GLUCOPHAGE-XR) 500 MG 24 hr tablet Take 2,000 mg by mouth daily with breakfast.    . potassium chloride SA (K-DUR,KLOR-CON) 20 MEQ tablet Take 1 tablet (20 mEq total) by mouth daily. 30 tablet 6  . sotalol (BETAPACE) 80 MG tablet Take 1.5 tablets (120 mg total) by mouth 2 (two) times daily. 270 tablet 3   No current facility-administered medications for this encounter.    Allergies  Allergen Reactions  . Penicillins Hives    History   Social History  . Marital Status: Married    Spouse Name: N/A    Number of Children: N/A  . Years of Education: N/A   Occupational History  . Not on file.   Social History Main Topics  . Smoking status: Passive Smoke Exposure - Never Smoker  . Smokeless tobacco: Not on file  . Alcohol Use: Yes     Comment: seldom  . Drug Use: No  . Sexual Activity: Not on file   Other Topics Concern  . Not on file   Social History Narrative    Family History  Problem Relation Age of Onset  .  Diabetes    . Hypertension Father   . Other Father     pat/mat uncles, malignant neoplasm of the prostate gland  . Stroke Father     ROS-as per HPI above  Physical Exam: Filed Vitals:   10/21/14 1420  BP: 140/88  Pulse: 70  Resp: 14  Height: 6' (1.829 m)  Weight: 238 lb (107.956 kg)  SpO2: 98%    GEN- The patient is well appearing, alert and oriented x 3 today.   Head- normocephalic, atraumatic Eyes-  Sclera clear, conjunctiva pink Ears- hearing intact Oropharynx- clear Neck- supple, no JVP Lymph- no cervical lymphadenopathy Lungs- Clear to ausculation bilaterally, normal work of breathing Heart- Regular rate and  rhythm, no murmurs, rubs or gallops, PMI not laterally displaced GI- soft, NT, ND, + BS Extremities- no clubbing, cyanosis, or edema MS- no significant deformity or atrophy Skin- no rash or lesion Psych- euthymic mood, full affect Neuro- strength and sensation are intact  EKG- SR, possible left atrial enlargement, NST wave changes, QTC 486 ms.  Assessment and Plan:  Afib,  1. asypmtomatic Appears to be staying in SR, at least pt is not symptomatic with dizziness/presyncopy. Continue sotalol 120 bid. Continue Eliquis. Continue CPAP.  2. BP under good control without HCTZ, which may have contributed to dizziness.  3. F/u Dr. Johney FrameAllred in 4-6 months.     Co SignRudi Coco: George Ralph, MD 10/21/2014 4:54 PM

## 2014-10-21 NOTE — Patient Instructions (Signed)
Your physician recommends that you continue on your current medications as directed. Please refer to the Current Medication list given to you today.  Your physician recommends that you schedule a follow-up appointment in: 4 months with Dr Johney FrameAllred for medication management/ afib.

## 2014-10-22 NOTE — Addendum Note (Signed)
Encounter addended by: Deitra MayoBeverly H Suzannah Bettes, CCT on: 10/22/2014 11:30 AM<BR>     Documentation filed: Charges VN

## 2014-11-13 ENCOUNTER — Encounter: Payer: Self-pay | Admitting: Cardiology

## 2014-11-13 ENCOUNTER — Ambulatory Visit (INDEPENDENT_AMBULATORY_CARE_PROVIDER_SITE_OTHER): Payer: 59 | Admitting: Cardiology

## 2014-11-13 VITALS — BP 118/60 | HR 135 | Ht 72.0 in | Wt 235.8 lb

## 2014-11-13 DIAGNOSIS — I480 Paroxysmal atrial fibrillation: Secondary | ICD-10-CM

## 2014-11-13 DIAGNOSIS — R55 Syncope and collapse: Secondary | ICD-10-CM

## 2014-11-13 DIAGNOSIS — I10 Essential (primary) hypertension: Secondary | ICD-10-CM

## 2014-11-13 NOTE — Patient Instructions (Signed)
Schedule an appointment for an EKG on Monday or Tuesday next week.   Your physician recommends that you schedule a follow-up appointment in: 3 weeks with Dr Shirlee LatchMcLean.

## 2014-11-15 NOTE — Progress Notes (Signed)
Patient ID: George Carr, male   DOB: 1955-12-03, 58 y.o.   MRN: [id number] PCP: Dr. Leonette MostKalish [facility]([place])  58 yo with history of type II diabetes, HTN, and paroxysmal atrial fibrillation.  Atrial fibrillation was first noted in 2/15 when he went for a colonoscopy.  He was unaware of it, no symptoms.  He was started on sotalol and Xarelto, later switched to Eliquis.  He went back into NSR.  He denies exertional dyspnea or chest pain.  He has good exercise tolerance.  He tries to walk for exercise as much as he can.  He has OSA and is using CPAP.  Echo was done in 3/15, showing preserved LV systolic function.  Lexiscan Cardiolite was done in 3/15 showing no ischemia.     Patient was doing well until 07/25/14.  On that day, he had a "weak spell" while walking through his kitchen.  He did not pass out completely but felt weak and lightheaded and slumped down to the floor.  He did not feel chest pain or palpitations.  He felt back to normal after about 30 seconds.  He has not had any further episodes like this.  He was seen in this office in on 8/28 after the "spell."  He was noted to be in atrial fibrillation with rate control.  He did not feel the atrial fibrillation.  Presyncopal episode was thought to be possible related to postural hypotension.  HCTZ was stopped and he has had no further dizziness.    Patient is in atrial fibrillation again today.  He does not feel it.  No chest pain or dyspnea.  He saw Dr. Johney FrameAllred for evaluation for afib ablation, and given lack of symptoms, he was not thought to be an ablation candidate.    ECG: NSR with runs of atrial fibrillation with RVR.   Labs (8/15): K 3.7, creatinine 0.9, LDL 78, HDL 42 Labs (9/15): K 3.9, creatinine 0.8, HCT 43.7, LDL 51, HDL 33, LFTs normal.   Labs (11/15): K 4.2, creatinine 0.8, LDL 52  PMH:  1. Type II diabetes 2. HTN 3. Obesity 4. Hyperlipidemia 5. Atrial fibrillation: paroxysmal.  1st noted in 2/15 when he went for a colonoscopy.   He was started on sotalol and Xarelto.  Echo (3/15) with EF 60%, mild LVH, mild AI.   6. OSA on CPAP 7. Lexiscan Cardiolite (3/15) with EF 59%, no ischemia.  8. Presyncope (8/15)  FH: Father with CAD, atrial fibrillation.   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    ROS: All systems reviewed and negative except as per HPI.   Current Outpatient Prescriptions  Medication Sig Dispense Refill  . apixaban (ELIQUIS) 5 MG TABS tablet Take 1 tablet (5 mg total) by mouth 2 (two) times daily. 60 tablet 10  . atorvastatin (LIPITOR) 10 MG tablet Pt is to take 5 mg (1/2 tablet) by mouth once daily. 30 tablet 3  . lisinopril (PRINIVIL,ZESTRIL) 20 MG tablet Take 1 tablet (20 mg total) by mouth daily. 90 tablet 3  . metFORMIN (GLUCOPHAGE-XR) 500 MG 24 hr tablet Take 2,000 mg by mouth daily with breakfast.    . potassium chloride SA (K-DUR,KLOR-CON) 20 MEQ tablet Take 1 tablet (20 mEq total) by mouth daily. 30 tablet 6  . sotalol (BETAPACE) 80 MG tablet Take 1.5 tablets (120 mg total) by mouth 2 (two) times daily. 270 tablet 3   No current facility-administered medications for this visit.   BP  118/60 mmHg  Pulse 135  Ht 6' (1.829 m)  Wt 235 lb 12.8 oz (106.958 kg)  BMI 31.97 kg/m2  SpO2 97% General: NAD, overweight Neck: Thick, no JVD, no thyromegaly or thyroid nodule.  Lungs: Clear to auscultation bilaterally with normal respiratory effort. CV: Nondisplaced PMI.  Heart irregular S1/S2, no S3/S4, no murmur.  No peripheral edema.  No carotid bruit.  Normal pedal pulses.  Abdomen: Soft, nontender, no hepatosplenomegaly, no distention.  Skin: Intact without lesions or rashes.  Neurologic: Alert and oriented x 3.  Psych: Normal affect. Extremities: No clubbing or cyanosis.   Assessment/Plan: 1. Paroxysmal atrial fibrillation: Patient is having runs of atrial fibrillation today interspersed with NSR.  Multiple atrial fibrillation risk factors including HTN, diabetes,  and OSA.  - Continue sotalol for NSR maintenance.  QTc is not prolonged today. - Continue Eliquis.  - As he is asymptomatic in atrial fibrillation, he is not an ideal ablation candidate (saw Dr Johney FrameAllred recently).   - He will come back in a week for an ECG to make sure that he is out of atrial fibrillation.  2. HTN: BP controlled. 3. Hyperlipidemia: Good lipids recently.  4. OSA: I encouraged CPAP use as this can help lower risk of atrial fibrillation recurrence potentially.  5. Presyncope: This was probably related to postural hypotension.  Orthostatic symptoms have resolved with discontinuation of HCTZ.  Followup in 1 month after monitor is complete.   George Carr 11/15/2014

## 2014-11-19 ENCOUNTER — Ambulatory Visit (INDEPENDENT_AMBULATORY_CARE_PROVIDER_SITE_OTHER): Payer: 59

## 2014-11-19 VITALS — HR 59

## 2014-11-19 DIAGNOSIS — I480 Paroxysmal atrial fibrillation: Secondary | ICD-10-CM

## 2014-11-19 NOTE — Progress Notes (Signed)
Pt came into the office for EKG.  EKG shows Sinus Bradycardia, 59. No complaints per pt.

## 2014-11-19 NOTE — Patient Instructions (Signed)
Your physician recommends that you continue on your current medications as directed. Please refer to the Current Medication list given to you today.  Keep your scheduled follow-up with Dr Shirlee LatchMcLean 12/04/14.

## 2014-12-04 ENCOUNTER — Ambulatory Visit (INDEPENDENT_AMBULATORY_CARE_PROVIDER_SITE_OTHER): Payer: 59 | Admitting: Cardiology

## 2014-12-04 ENCOUNTER — Encounter: Payer: Self-pay | Admitting: Cardiology

## 2014-12-04 VITALS — BP 140/90 | HR 68 | Ht 72.0 in | Wt 239.0 lb

## 2014-12-04 DIAGNOSIS — I480 Paroxysmal atrial fibrillation: Secondary | ICD-10-CM

## 2014-12-04 DIAGNOSIS — G4733 Obstructive sleep apnea (adult) (pediatric): Secondary | ICD-10-CM

## 2014-12-04 DIAGNOSIS — I10 Essential (primary) hypertension: Secondary | ICD-10-CM

## 2014-12-04 MED ORDER — LISINOPRIL 40 MG PO TABS: 40.0000 mg | ORAL_TABLET | Freq: Every day | ORAL | Status: DC

## 2014-12-04 NOTE — Patient Instructions (Signed)
Increase lisinopril to 40mg  daily. You can take 2 of your 20mg  tablets daily at the same time and use your current supply.  Your physician recommends that you return for lab work in: 2 weeks--BMET/CBd.  Your physician wants you to follow-up in: 6 months with Dr Shirlee LatchMcLean. (July 2016). You will receive a reminder letter in the mail two months in advance. If you don't receive a letter, please call our office to schedule the follow-up appointment.

## 2014-12-05 NOTE — Progress Notes (Signed)
Patient ID: George Carr, male   DOB: [birth year], 59 y.o.   MRN: [id number] PCP: Dr. Leonette Most [facility])  59 yo with history of type II diabetes, HTN, and paroxysmal atrial fibrillation.  Atrial fibrillation was first noted in 2/15 when he went for a colonoscopy.  He was unaware of it, no symptoms.  He was started on sotalol and Xarelto, later switched to Eliquis.  He went back into NSR.  He denies exertional dyspnea or chest pain.  He has good exercise tolerance.  He tries to walk for exercise as much as he can.  He has OSA and is using CPAP.  Echo was done in 3/15, showing preserved LV systolic function.  Lexiscan Cardiolite was done in 3/15 showing no ischemia.     Patient was doing well until 07/25/14.  On that day, he had a "weak spell" while walking through his kitchen.  He did not pass out completely but felt weak and lightheaded and slumped down to the floor.  He did not feel chest pain or palpitations.  He felt back to normal after about 30 seconds.  He has not had any further episodes like this.  He was seen in this office in on 8/28 after the "spell."  He was noted to be in atrial fibrillation with rate control.  He did not feel the atrial fibrillation.  Presyncopal episode was thought to be possible related to postural hypotension.  HCTZ was stopped and he has had no further dizziness.    Patient was in atrial fibrillation at last appointment.  He does not feel it.  No chest pain or dyspnea.  He saw Dr. Johney Frame for evaluation for afib ablation, and given lack of symptoms, he was not thought to be an ablation candidate.  Today, he is in NSR.  No BRBPR or melena.  BP has been running high.   ECG (11/20/14): NSR at 59  Labs (8/15): K 3.7, creatinine 0.9, LDL 78, HDL 42 Labs (9/15): K 3.9, creatinine 0.8, HCT 43.7, LDL 51, HDL 33, LFTs normal.   Labs (11/15): K 4.2, creatinine 0.8, LDL 52  PMH:  1. Type II diabetes 2. HTN 3. Obesity 4. Hyperlipidemia 5. Atrial fibrillation: paroxysmal.   1st noted in 2/15 when he went for a colonoscopy.  He was started on sotalol and Xarelto.  Echo (3/15) with EF 60%, mild LVH, mild AI.   6. OSA on CPAP 7. Lexiscan Cardiolite (3/15) with EF 59%, no ischemia.  8. Presyncope (8/15)  FH: Father with CAD, atrial fibrillation.   SH: Married, 1 son, lives in [place], [place], works in Airline pilot.    ROS: All systems reviewed and negative except as per HPI.   Current Outpatient Prescriptions  Medication Sig Dispense Refill  . apixaban (ELIQUIS) 5 MG TABS tablet Take 1 tablet (5 mg total) by mouth 2 (two) times daily. 60 tablet 10  . atorvastatin (LIPITOR) 10 MG tablet Pt is to take 5 mg (1/2 tablet) by mouth once daily. 30 tablet 3  . lisinopril (PRINIVIL,ZESTRIL) 40 MG tablet Take 1 tablet (40 mg total) by mouth daily. 90 tablet 1  . metFORMIN (GLUCOPHAGE-XR) 500 MG 24 hr tablet Take 2,000 mg by mouth daily with breakfast.    . potassium chloride SA (K-DUR,KLOR-CON) 20 MEQ tablet Take 1 tablet (20 mEq total) by mouth daily. 30 tablet 6  . sotalol (BETAPACE) 80 MG tablet Take 1.5 tablets (120 mg total) by mouth 2 (two) times daily. 270 tablet 3  No current facility-administered medications for this visit.   BP 140/90 mmHg  Pulse 68  Ht 6' (1.829 m)  Wt 239 lb (108.41 kg)  BMI 32.41 kg/m2  SpO2 97% General: NAD, overweight Neck: Thick, no JVD, no thyromegaly or thyroid nodule.  Lungs: Clear to auscultation bilaterally with normal respiratory effort. CV: Nondisplaced PMI.  Heart irregular S1/S2, no S3/S4, no murmur.  Trace ankle edema.  No carotid bruit.  Normal pedal pulses.  Abdomen: Soft, nontender, no hepatosplenomegaly, no distention.  Skin: Intact without lesions or rashes.  Neurologic: Alert and oriented x 3.  Psych: Normal affect. Extremities: No clubbing or cyanosis.   Assessment/Plan: 1. Paroxysmal atrial fibrillation:  Multiple atrial fibrillation risk factors including HTN, diabetes, and OSA. Currently in NSR.  Needs to  lose weight and needs better BP control.  - Continue sotalol for NSR maintenance.   - Continue Eliquis,CBC with BMET in 2 wks.  - As he is asymptomatic in atrial fibrillation, he is not an ideal ablation candidate (saw Dr Johney FrameAllred recently).   2. HTN: BP too high, increase lisinopril to 40 mg daily with BMET in 2 wks.  3. Hyperlipidemia: Good lipids recently.  4. OSA: I encouraged CPAP use as this can help lower risk of atrial fibrillation recurrence potentially.  5. Presyncope: This was probably related to postural hypotension.  Orthostatic symptoms have resolved with discontinuation of HCTZ.  6. Obesity: Needs weight loss, this will help decrease risk of recurrence of atrial fibrillation.   Followup in 6 months.   Marca AnconaDalton McLean 12/05/2014

## 2014-12-19 ENCOUNTER — Other Ambulatory Visit: Payer: 59

## 2014-12-22 ENCOUNTER — Other Ambulatory Visit (INDEPENDENT_AMBULATORY_CARE_PROVIDER_SITE_OTHER): Payer: 59 | Admitting: *Deleted

## 2014-12-22 DIAGNOSIS — I480 Paroxysmal atrial fibrillation: Secondary | ICD-10-CM

## 2014-12-22 LAB — BASIC METABOLIC PANEL
BUN: 18 mg/dL (ref 6–23)
CO2: 28 meq/L (ref 19–32)
CREATININE: 0.79 mg/dL (ref 0.40–1.50)
Calcium: 9.6 mg/dL (ref 8.4–10.5)
Chloride: 102 mEq/L (ref 96–112)
GFR: 106.97 mL/min (ref >60.00)
Glucose, Bld: 91 mg/dL (ref 70–99)
POTASSIUM: 4.1 meq/L (ref 3.5–5.1)
SODIUM: 138 meq/L (ref 135–145)

## 2014-12-22 LAB — CBC WITH DIFFERENTIAL/PLATELET
BASOS PCT: 0.5 % (ref 0.0–3.0)
Basophils Absolute: 0 10*3/uL (ref 0.0–0.1)
EOS PCT: 2.1 % (ref 0.0–5.0)
Eosinophils Absolute: 0.2 10*3/uL (ref 0.0–0.7)
HCT: 43.7 % (ref 39.0–52.0)
Hemoglobin: 15 g/dL (ref 13.0–17.0)
Lymphocytes Relative: 45.4 % (ref 12.0–46.0)
Lymphs Abs: 3.6 10*3/uL (ref 0.7–4.0)
MCHC: 34.3 g/dL (ref 30.0–36.0)
MCV: 89.9 fl (ref 78.0–100.0)
Monocytes Absolute: 0.6 10*3/uL (ref 0.1–1.0)
Monocytes Relative: 7.8 % (ref 3.0–12.0)
Neutro Abs: 3.5 10*3/uL (ref 1.4–7.7)
Neutrophils Relative %: 44.2 % (ref 43.0–77.0)
PLATELETS: 242 10*3/uL (ref 150.0–400.0)
RBC: 4.86 Mil/uL (ref 4.22–5.81)
RDW: 12.9 % (ref 11.5–15.5)
WBC: 7.8 10*3/uL (ref 4.0–10.5)

## 2014-12-29 ENCOUNTER — Other Ambulatory Visit: Payer: Self-pay | Admitting: *Deleted

## 2014-12-29 MED ORDER — POTASSIUM CHLORIDE CRYS ER 20 MEQ PO TBCR: 20.0000 meq | EXTENDED_RELEASE_TABLET | Freq: Every day | ORAL | Status: DC

## 2015-01-23 ENCOUNTER — Other Ambulatory Visit: Payer: Self-pay

## 2015-01-23 MED ORDER — ATORVASTATIN CALCIUM 10 MG PO TABS: ORAL_TABLET | ORAL | Status: DC

## 2015-02-17 ENCOUNTER — Other Ambulatory Visit: Payer: Self-pay

## 2015-02-18 ENCOUNTER — Encounter: Payer: Self-pay | Admitting: Internal Medicine

## 2015-02-18 ENCOUNTER — Other Ambulatory Visit: Payer: Self-pay

## 2015-02-18 ENCOUNTER — Ambulatory Visit (INDEPENDENT_AMBULATORY_CARE_PROVIDER_SITE_OTHER): Payer: 59 | Admitting: Internal Medicine

## 2015-02-18 VITALS — BP 130/88 | HR 59 | Ht 72.0 in | Wt 238.0 lb

## 2015-02-18 DIAGNOSIS — G4733 Obstructive sleep apnea (adult) (pediatric): Secondary | ICD-10-CM | POA: Diagnosis not present

## 2015-02-18 DIAGNOSIS — I10 Essential (primary) hypertension: Secondary | ICD-10-CM

## 2015-02-18 DIAGNOSIS — I480 Paroxysmal atrial fibrillation: Secondary | ICD-10-CM | POA: Diagnosis not present

## 2015-02-18 DIAGNOSIS — I4819 Other persistent atrial fibrillation: Secondary | ICD-10-CM

## 2015-02-18 DIAGNOSIS — I481 Persistent atrial fibrillation: Secondary | ICD-10-CM | POA: Diagnosis not present

## 2015-02-18 NOTE — Progress Notes (Signed)
Primary Care Physician: Sid Falcon, MD Referring Physician:  [name] is a 59 y.o. male with a h/o 59 yo with history of type II diabetes, HTN, and paroxysmal atrial fibrillation. Atrial fibrillation was first noted in 2/15 when he went for a colonoscopy. He was unaware of it, no symptoms. He was started on sotalol and Xarelto. He went back into NSR .  He had an episode a couple of weeks ago when he was walking around in the am, after eating breakfast, got dizzy and fell to the floor, did not have LOC. He went to the doctor and ekg showed afib at 108 bpm. He is unaware of heart beat and does not feel palpitations. He wore a monitor wjich showed some PAF which again pt is asymptomatic. He reports some dizziness at work recently but had just been told he had to assume a new position.Otherwise, he reports good energy, no exertional chest pain or shortness of breath. He continues on sotalol 120 mg bid and Noac with chadsvasc of at least 2.  For the most part feels he stays in SR, did have MD appointment in December and EKG showed aflutter, pt was unaware. Wears CPAP on a regular basis.   Today, he denies symptoms of palpitations, chest pain, shortness of breath, orthopnea, PND, lower extremity edema, dizziness, presyncope, syncope, or neurologic sequela. The patient is tolerating medications without difficulties and is otherwise without complaint today.   Past Medical History  Diagnosis Date  . Atrial fibrillation   . Essential hypertension, benign   . Type II or unspecified type diabetes mellitus without mention of complication, not stated as uncontrolled   . 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 Obesity, unspecified   . Pure hyperglyceridemia   . Personal history of other specified diseases(V13.89)    Past Surgical History  Procedure Laterality Date  . Knee arthrotomy Right 1978  . Cornea radial keratotomy Bilateral 2005  . Nasal septoplasty  w/ turbinoplasty  1999    Current Outpatient Prescriptions  Medication Sig Dispense Refill  . apixaban (ELIQUIS) 5 MG TABS tablet Take 1 tablet (5 mg total) by mouth 2 (two) times daily. 60 tablet 10  . atorvastatin (LIPITOR) 10 MG tablet Pt is to take 5 mg (1/2 tablet) by mouth once daily. 30 tablet 6  . lisinopril (PRINIVIL,ZESTRIL) 40 MG tablet Take 1 tablet (40 mg total) by mouth daily. 90 tablet 1  . metFORMIN (GLUCOPHAGE-XR) 500 MG 24 hr tablet Take 2,000 mg by mouth daily with breakfast.    . potassium chloride SA (K-DUR,KLOR-CON) 20 MEQ tablet Take 1 tablet (20 mEq total) by mouth daily. 30 tablet 5  . sotalol (BETAPACE) 80 MG tablet Take 1.5 tablets (120 mg total) by mouth 2 (two) times daily. 270 tablet 3   No current facility-administered medications for this visit.    Allergies  Allergen Reactions  . Penicillins Hives    History   Social History  . Marital Status: Married    Spouse Name: N/A  . Number of Children: N/A  . Years of Education: N/A   Occupational History  . Not on file.   Social History Main Topics  . Smoking status: Passive Smoke Exposure - Never Smoker  . Smokeless tobacco: Not on file  . Alcohol Use: Yes     Comment: seldom  . Drug Use: No  . Sexual Activity: Not on file   Other Topics Concern  . Not  on file   Social History Narrative    Family History  Problem Relation Age of Onset  . Diabetes    . Hypertension Father   . Other Father     pat/mat uncles, malignant neoplasm of the prostate gland  . Stroke Father   . Diabetes Mother   . Heart disease Maternal Aunt     ROS- All systems are reviewed and negative except as per the HPI above  Physical Exam: Filed Vitals:   02/18/15 1006 02/18/15 1525  BP: 148/98 130/88  Pulse: 59   Height: 6' (1.829 m)   Weight: 238 lb (107.956 kg)     GEN- The patient is well appearing, alert and oriented x 3 today.   Head- normocephalic, atraumatic Eyes-  Sclera clear, conjunctiva  pink Ears- hearing intact Oropharynx- clear Neck- supple, no JVP Lymph- no cervical lymphadenopathy Lungs- Clear to ausculation bilaterally, normal work of breathing Heart- Regular rate and rhythm, no murmurs, rubs or gallops, PMI not laterally displaced GI- soft, NT, ND, + BS Extremities- no clubbing, cyanosis, or edema MS- no significant deformity or atrophy Skin- no rash or lesion Psych- euthymic mood, full affect Neuro- strength and sensation are intact  EKG- Sinus brady at 59 bpm, NSTW abnormality, QTc 435 ms Epic records including Dr Kathlyn SacramentoMcLeans notes are reviewed Component     Latest Ref Rng 05/29/2014 07/25/2014 08/08/2014 12/22/2014  Sodium     135 - 145 mEq/L 139 139 137 138  Potassium     3.5 - 5.1 mEq/L 3.4 (L) 3.7 3.9 4.1  Chloride     96 - 112 mEq/L 100 101 100 102  CO2     19 - 32 mEq/L 29 32 29 28  Glucose     70 - 99 mg/dL 409111 (H) 811130 (H) 914112 (H) 91  BUN     6 - 23 mg/dL 19 17 14 18   Creatinine     0.40 - 1.50 mg/dL 1.2 0.9 0.8 [id number]  Calcium     8.4 - 10.5 mg/dL 9.6 9.7 9.4 9.6  GFR      >60.00 mL/min 63.70 89.85 104.06 106.97   CBC 12/22/14- unremarkable   Assessment and Plan:  1. Afib  Asymtomatic Continue sotalol 120 mg bid  Coninue apixaban for chadsvasc score of 2  2. HTN Rechecked at 130/88 Stable No change required today   3. Sleep apnea Continue cpap  4. Obesity Regular exercise encouraged to aid in weight loss and afib burden   F/u afib clinic in 6 months.  I will see as needed going forward

## 2015-02-18 NOTE — Patient Instructions (Signed)
Your physician recommends that you schedule a follow-up appointment in: 6 months in the Afib Clinic with [name], NP @ [facility] at  (old main entrance [address] of [address]) [phone]

## 2015-03-09 ENCOUNTER — Ambulatory Visit: Payer: 59 | Admitting: Internal Medicine

## 2015-06-04 ENCOUNTER — Other Ambulatory Visit: Payer: Self-pay | Admitting: *Deleted

## 2015-06-04 DIAGNOSIS — I480 Paroxysmal atrial fibrillation: Secondary | ICD-10-CM

## 2015-06-04 MED ORDER — LISINOPRIL 40 MG PO TABS: 40.0000 mg | ORAL_TABLET | Freq: Every day | ORAL | Status: DC

## 2015-06-26 ENCOUNTER — Other Ambulatory Visit: Payer: Self-pay

## 2015-06-26 DIAGNOSIS — I480 Paroxysmal atrial fibrillation: Secondary | ICD-10-CM

## 2015-06-26 MED ORDER — POTASSIUM CHLORIDE CRYS ER 20 MEQ PO TBCR: 20.0000 meq | EXTENDED_RELEASE_TABLET | Freq: Every day | ORAL | Status: DC

## 2015-08-21 ENCOUNTER — Inpatient Hospital Stay (HOSPITAL_COMMUNITY): Admission: RE | Admit: 2015-08-21 | Payer: 59 | Source: Ambulatory Visit | Admitting: Nurse Practitioner

## 2015-08-26 ENCOUNTER — Other Ambulatory Visit: Payer: Self-pay | Admitting: Cardiology

## 2015-08-28 ENCOUNTER — Ambulatory Visit (INDEPENDENT_AMBULATORY_CARE_PROVIDER_SITE_OTHER): Payer: 59 | Admitting: Cardiology

## 2015-08-28 ENCOUNTER — Encounter: Payer: Self-pay | Admitting: Cardiology

## 2015-08-28 VITALS — BP 168/108 | HR 67 | Ht 72.0 in | Wt 242.0 lb

## 2015-08-28 DIAGNOSIS — I480 Paroxysmal atrial fibrillation: Secondary | ICD-10-CM

## 2015-08-28 DIAGNOSIS — I10 Essential (primary) hypertension: Secondary | ICD-10-CM

## 2015-08-28 LAB — CBC WITH DIFFERENTIAL/PLATELET
BASOS ABS: 0.1 10*3/uL (ref 0.0–0.1)
BASOS PCT: 0.6 % (ref 0.0–3.0)
Eosinophils Absolute: 0.2 10*3/uL (ref 0.0–0.7)
Eosinophils Relative: 1.8 % (ref 0.0–5.0)
HCT: 43.7 % (ref 39.0–52.0)
HEMOGLOBIN: 14.6 g/dL (ref 13.0–17.0)
LYMPHS PCT: 41.2 % (ref 12.0–46.0)
Lymphs Abs: 3.7 10*3/uL (ref 0.7–4.0)
MCHC: 33.3 g/dL (ref 30.0–36.0)
MCV: 91.5 fl (ref 78.0–100.0)
MONOS PCT: 6.2 % (ref 3.0–12.0)
Monocytes Absolute: 0.6 10*3/uL (ref 0.1–1.0)
NEUTROS ABS: 4.5 10*3/uL (ref 1.4–7.7)
Neutrophils Relative %: 50.2 % (ref 43.0–77.0)
PLATELETS: 262 10*3/uL (ref 150.0–400.0)
RBC: 4.78 Mil/uL (ref 4.22–5.81)
RDW: 13.6 % (ref 11.5–15.5)
WBC: 9 10*3/uL (ref 4.0–10.5)

## 2015-08-28 LAB — BASIC METABOLIC PANEL
BUN: 20 mg/dL (ref 6–23)
CHLORIDE: 102 meq/L (ref 96–112)
CO2: 30 mEq/L (ref 19–32)
Calcium: 9.8 mg/dL (ref 8.4–10.5)
Creatinine, Ser: 0.79 mg/dL (ref 0.40–1.50)
GFR: 106.71 mL/min (ref >60.00)
Glucose, Bld: 107 mg/dL — ABNORMAL HIGH (ref 70–99)
Potassium: 4.3 mEq/L (ref 3.5–5.1)
SODIUM: 140 meq/L (ref 135–145)

## 2015-08-28 MED ORDER — AMLODIPINE BESYLATE 5 MG PO TABS: 5.0000 mg | ORAL_TABLET | Freq: Every day | ORAL | Status: DC

## 2015-08-28 NOTE — Patient Instructions (Signed)
Medication Instructions:  Start amlodipine  daily  Labwork: BMET/CBCd today  Testing/Procedures: None today.  Follow-Up: Your physician wants you to follow-up in: 6 months with Dr Shirlee Latch. (March 2017).  You will receive a reminder letter in the mail two months in advance. If you don't receive a letter, please call our office to schedule the follow-up appointment.   Any Other Special Instructions Will Be Listed Below (If Applicable).  Your physician has requested that you regularly monitor and record your blood pressure readings at home. Please use the same machine at the same time of day to check your readings and record them. I will call you in about 3  weeks to get the Jiles Garter, RN

## 2015-08-30 NOTE — Progress Notes (Signed)
Patient ID: George Carr, male   DOB: [birth year], 59 y.o.   MRN: [id number] PCP: Dr. Leonette Most [facility])  59 yo with history of type II diabetes, HTN, and paroxysmal atrial fibrillation.  Atrial fibrillation was first noted in 2/15 when he went for a colonoscopy.  He was unaware of it, no symptoms.  He was started on sotalol and Xarelto, later switched to Eliquis.  He went back into NSR.  He denies exertional dyspnea or chest pain.  He has good exercise tolerance.  He tries to walk for exercise as much as he can.  He has OSA and is using CPAP.  Echo was done in 3/15, showing preserved LV systolic function.  Lexiscan Cardiolite was done in 3/15 showing no ischemia.     Patient was doing well until 07/25/14.  On that day, he had a "weak spell" while walking through his kitchen.  He did not pass out completely but felt weak and lightheaded and slumped down to the floor.  He did not feel chest pain or palpitations.  He felt back to normal after about 30 seconds.  He has not had any further episodes like this.  He was seen in this office in on 8/28 after the "spell."  He was noted to be in atrial fibrillation with rate control.  He did not feel the atrial fibrillation.  Presyncopal episode was thought to be possible related to postural hypotension.  HCTZ was stopped and he has had no further dizziness.    No palpitations.  He is in NSR today.  No chest pain or dyspnea.  He saw Dr. Johney Frame for evaluation for afib ablation, and given lack of symptoms, he was not thought to be an ablation candidate.  No BRBPR or melena.  BP still running high.   ECG: NSR, LVH, QTc 481 msec  Labs (8/15): K 3.7, creatinine 0.9, LDL 78, HDL 42 Labs (9/15): K 3.9, creatinine 0.8, HCT 43.7, LDL 51, HDL 33, LFTs normal.   Labs (11/15): K 4.2, creatinine 0.8, LDL 52 Labs (1/16): K 4.1, creatinine 0.79  PMH:  1. Type II diabetes 2. HTN 3. Obesity 4. Hyperlipidemia 5. Atrial fibrillation: paroxysmal.  1st noted in 2/15 when he  went for a colonoscopy.  He was started on sotalol and Xarelto.  Echo (3/15) with EF 60%, mild LVH, mild AI.   6. OSA on CPAP 7. Lexiscan Cardiolite (3/15) with EF 59%, no ischemia.  8. Presyncope (8/15)  FH: Father with CAD, atrial fibrillation.   SH: Married, 1 son, lives in [place], [place], works in Airline pilot.    ROS: All systems reviewed and negative except as per HPI.   Current Outpatient Prescriptions  Medication Sig Dispense Refill  . apixaban (ELIQUIS) 5 MG TABS tablet Take 1 tablet (5 mg total) by mouth 2 (two) times daily. 60 tablet 10  . atorvastatin (LIPITOR) 10 MG tablet Pt is to take 5 mg (1/2 tablet) by mouth once daily. 30 tablet 6  . lisinopril (PRINIVIL,ZESTRIL) 40 MG tablet Take 1 tablet (40 mg total) by mouth daily. 90 tablet 1  . metFORMIN (GLUCOPHAGE-XR) 500 MG 24 hr tablet Take 2,000 mg by mouth daily with breakfast.    . ONE TOUCH ULTRA TEST test strip CHECK BLOOD SUGAR ONCE D  5  . potassium chloride SA (K-DUR,KLOR-CON) 20 MEQ tablet TAKE 1 TABLET(20 MEQ) BY MOUTH DAILY 30 tablet 0  . sotalol (BETAPACE) 80 MG tablet Take 1.5 tablets (120 mg total) by mouth  2 (two) times daily. 270 tablet 3  . amLODipine (NORVASC) 5 MG tablet Take 1 tablet (5 mg total) by mouth daily. 90 tablet 1   No current facility-administered medications for this visit.   BP 168/108 mmHg  Pulse 67  Ht 6' (1.829 m)  Wt 242 lb (109.77 kg)  BMI 32.81 kg/m2 General: NAD, overweight Neck: Thick, no JVD, no thyromegaly or thyroid nodule.  Lungs: Clear to auscultation bilaterally with normal respiratory effort. CV: Nondisplaced PMI.  Heart irregular S1/S2, no S3/S4, no murmur.  No edema.  No carotid bruit.  Normal pedal pulses.  Abdomen: Soft, nontender, no hepatosplenomegaly, no distention.  Skin: Intact without lesions or rashes.  Neurologic: Alert and oriented x 3.  Psych: Normal affect. Extremities: No clubbing or cyanosis.   Assessment/Plan: 1. Paroxysmal atrial fibrillation:   Multiple atrial fibrillation risk factors including HTN, diabetes, and OSA. Currently in NSR.  Needs to lose weight and needs better BP control.  - Continue sotalol for NSR maintenance, QTc acceptable on ECG.   - Continue Eliquis,CBC with BMET today.  - As he is asymptomatic in atrial fibrillation, he is not an ideal ablation candidate (saw Dr Johney Frame recently).   2. HTN: BP too high, add amlodipine 5 mg daily.  We will call for BP check in 2 wks (he will take BP daily).  3. Hyperlipidemia: Good lipids when last checked. 4. OSA: I encouraged CPAP use as this can help lower risk of atrial fibrillation recurrence potentially.  5. Obesity: Needs weight loss, this will help decrease risk of recurrence of atrial fibrillation.   Followup in 6 months.   Marca Ancona 08/30/2015

## 2015-09-10 ENCOUNTER — Telehealth: Payer: Self-pay | Admitting: *Deleted

## 2015-09-10 MED ORDER — AMLODIPINE BESYLATE 10 MG PO TABS: 10.0000 mg | ORAL_TABLET | Freq: Every day | ORAL | Status: DC

## 2015-09-10 NOTE — Telephone Encounter (Signed)
Copied from Dr Alford HighlandMcLean's 08/28/15 office note:  HTN: BP too high, add amlodipine 5 mg daily. We will call for BP check in 2 wks (he will take BP daily).   09/10/15  LMTCB

## 2015-09-10 NOTE — Telephone Encounter (Signed)
Increase amlodipine to 10 mg daily, BP check in 2 wks.

## 2015-09-10 NOTE — Telephone Encounter (Signed)
F/u ° ° ° °Pt returning Anne's phone call. °

## 2015-09-10 NOTE — Telephone Encounter (Signed)
Pt states BP today 148/86. Pt states BP range has been 146/88 since starting amlodipine. Pt states he is tolerating amlodipine. Pt advised I will forward to Dr Shirlee LatchMcLean for review.

## 2015-09-10 NOTE — Telephone Encounter (Signed)
Pt advised, verbalized understanding, agreed with plan.  

## 2015-09-17 ENCOUNTER — Other Ambulatory Visit: Payer: Self-pay | Admitting: Internal Medicine

## 2015-09-25 ENCOUNTER — Telehealth: Payer: Self-pay | Admitting: *Deleted

## 2015-09-25 NOTE — Telephone Encounter (Signed)
George Moralealton S McLean, MD at 09/10/2015 4:38 PM     Status: Signed        Increase amlodipine to 10 mg daily, BP check in 2 wks.       Above note copied from 09/10/15 phone note. Pt states BP since medication increase has been in the138/84 range.  Pt advised I will forward to Dr Shirlee LatchMcLean for review.

## 2015-09-25 NOTE — Telephone Encounter (Signed)
That is good

## 2015-09-25 NOTE — Telephone Encounter (Signed)
Pt.notified

## 2015-09-26 ENCOUNTER — Other Ambulatory Visit: Payer: Self-pay | Admitting: Cardiology

## 2015-12-01 ENCOUNTER — Other Ambulatory Visit: Payer: Self-pay | Admitting: Cardiology

## 2016-01-24 ENCOUNTER — Other Ambulatory Visit: Payer: Self-pay | Admitting: Cardiology

## 2016-03-30 ENCOUNTER — Other Ambulatory Visit: Payer: Self-pay | Admitting: Cardiology

## 2016-04-06 ENCOUNTER — Other Ambulatory Visit: Payer: Self-pay | Admitting: Internal Medicine

## 2016-04-14 ENCOUNTER — Other Ambulatory Visit: Payer: Self-pay | Admitting: Cardiology

## 2016-05-06 ENCOUNTER — Ambulatory Visit (INDEPENDENT_AMBULATORY_CARE_PROVIDER_SITE_OTHER): Payer: 59 | Admitting: Cardiology

## 2016-05-06 ENCOUNTER — Encounter: Payer: Self-pay | Admitting: Cardiology

## 2016-05-06 VITALS — BP 112/62 | HR 78 | Ht 72.0 in | Wt 237.0 lb

## 2016-05-06 DIAGNOSIS — I480 Paroxysmal atrial fibrillation: Secondary | ICD-10-CM

## 2016-05-06 DIAGNOSIS — I10 Essential (primary) hypertension: Secondary | ICD-10-CM

## 2016-05-06 MED ORDER — SOTALOL HCL 80 MG PO TABS: 120.0000 mg | ORAL_TABLET | Freq: Two times a day (BID) | ORAL | Status: DC

## 2016-05-06 NOTE — Patient Instructions (Signed)
Medication Instructions:  Your physician recommends that you continue on your current medications as directed. Please refer to the Current Medication list given to you today.   Labwork: None   Testing/Procedures: None   Follow-Up: Your physician wants you to follow-up in: 6 months with Dr Shirlee LatchMcLean. (December 2017). You will receive a reminder letter in the mail two months in advance. If you don't receive a letter, please call our office to schedule the follow-up appointment.        If you need a refill on your cardiac medications before your next appointment, please call your pharmacy.

## 2016-05-08 NOTE — Progress Notes (Signed)
Patient ID: George Carr, male   DOB: 04/11/1956, 60 y.o.   MRN: [id number] PCP: George. Leonette Carr [facility]  60 yo with history of type II diabetes, HTN, and atrial fibrillation.  Atrial fibrillation was first noted in 2/15 when he went for a colonoscopy.  He was unaware of it, no symptoms.  He was started on sotalol and Xarelto, later switched to Eliquis.  He went back into NSR.  He denies exertional dyspnea or chest pain.  He has good exercise tolerance.  He tries to walk for exercise as much as he can.  He has OSA and is using CPAP.  Echo was done in 3/15, showing preserved LV systolic function.  Lexiscan Cardiolite was done in 3/15 showing no ischemia.     Patient was doing well until 07/25/14.  On that day, he had a "weak spell" while walking through his kitchen.  He did not pass out completely but felt weak and lightheaded and slumped down to the floor.  He did not feel chest pain or palpitations.  He felt back to normal after about 30 seconds.  He has not had any further episodes like this.  He was seen in this office in on 8/28 after the "spell."  He was noted to be in atrial fibrillation with rate control.  He did not feel the atrial fibrillation.  Presyncopal episode was thought to be possible related to postural hypotension.  HCTZ was stopped and he has had no further dizziness.    Today, he is in atrial fibrillation.  He does not notice that he has been in atrial fibrillation.  At last appointment in 9/16, he was in NSR.  He remains on sotalol.  Doing well symptomatically.  No chest pain, no exertional dyspnea.  Some fatigue if he works a long time in the yard. Weight is down 5 lbs.  Using CPAP.  No BRBPR or melena.   ECG: atrial fibrillation at 89, QTc 447 msec  Labs (8/15): K 3.7, creatinine 0.9, LDL 78, HDL 42 Labs (9/15): K 3.9, creatinine 0.8, HCT 43.7, LDL 51, HDL 33, LFTs normal.   Labs (11/15): K 4.2, creatinine 0.8, LDL 52 Labs (1/16): K 4.1, creatinine 0.79 Labs (9/16): K 4.3,  creatinine 0.79 Labs (6/17): Hgb 14.2, K 4, creatinine 0.7, LDL 55  PMH:  1. Type II diabetes 2. HTN 3. Obesity 4. Hyperlipidemia 5. Atrial fibrillation: paroxysmal.  1st noted in 2/15 when he went for a colonoscopy.  He was started on sotalol and Xarelto.  Echo (3/15) with EF 60%, mild LVH, mild AI.   6. OSA on CPAP 7. Lexiscan Cardiolite (3/15) with EF 59%, no ischemia.  8. Presyncope (8/15)  FH: Father with CAD, atrial fibrillation.   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    ROS: All systems reviewed and negative except as per HPI.   Current Outpatient Prescriptions  Medication Sig Dispense Refill  . amLODipine (NORVASC) 10 MG tablet TAKE 1 TABLET(10 MG) BY MOUTH DAILY 30 tablet 3  . apixaban (ELIQUIS) 5 MG TABS tablet Take 1 tablet (5 mg total) by mouth 2 (two) times daily. 60 tablet 10  . atorvastatin (LIPITOR) 10 MG tablet TAKE 1/2 TABLET BY MOUTH ONCE DAILY 15 tablet 5  . lisinopril (PRINIVIL,ZESTRIL) 40 MG tablet TAKE 1 TABLET(40 MG) BY MOUTH DAILY 90 tablet 0  . metFORMIN (GLUCOPHAGE-XR) 500 MG 24 hr tablet Take 2,000 mg by mouth daily with breakfast.    .  ONE TOUCH ULTRA TEST test strip CHECK BLOOD SUGAR ONCE D  5  . potassium chloride SA (K-DUR,KLOR-CON) 20 MEQ tablet TAKE 1 TABLET(20 MEQ) BY MOUTH DAILY 30 tablet 3  . sotalol (BETAPACE) 80 MG tablet Take 1.5 tablets (120 mg total) by mouth 2 (two) times daily. 270 tablet 3   No current facility-administered medications for this visit.   BP 112/62 mmHg  Pulse 78  Ht 6' (1.829 m)  Wt 237 lb (107.502 kg)  BMI 32.14 kg/m2  SpO2 97% General: NAD, overweight Neck: Thick, no JVD, no thyromegaly or thyroid nodule.  Lungs: Clear to auscultation bilaterally with normal respiratory effort. CV: Nondisplaced PMI.  Heart irregular S1/S2, no S3/S4, no murmur.  No edema.  No carotid bruit.  Normal pedal pulses.  Abdomen: Soft, nontender, no hepatosplenomegaly, no distention.  Skin: Intact  without lesions or rashes.  Neurologic: Alert and oriented x 3.  Psych: Normal affect. Extremities: No clubbing or cyanosis.   Assessment/Plan: 1. Paroxysmal atrial fibrillation:  Multiple atrial fibrillation risk factors including HTN, diabetes, and OSA. Today, he is back in atrial fibrillation with controlled rate.  He does not note palpitations or dyspnea/fatigue.  He does not know that he is in atrial fibrillation.  He remains on sotalol.  BP is controlled and he is using his CPAP.  Still needs to lose weight.   - Continue sotalol for now; if he is still in atrial fibrillation at followup, may stop sotalol as it is not maintaining NSR and put him on Toprol XL.    - Continue Eliquis, CBC/BMET recently looked ok.  - As he is asymptomatic in atrial fibrillation, he is not an ideal ablation candidate (saw George Carr for ablation evaluation) and I would hold off on DCCV.   2. HTN: BP is controlled.  3. Hyperlipidemia: Good lipids in 6/17. 4. OSA: Continue to use CPAP.   5. Obesity: Needs weight loss.   Followup in 6 months.   George Carr 05/08/2016

## 2016-05-17 ENCOUNTER — Telehealth: Payer: Self-pay

## 2016-05-17 DIAGNOSIS — I480 Paroxysmal atrial fibrillation: Secondary | ICD-10-CM

## 2016-05-17 NOTE — Telephone Encounter (Signed)
I think he should at least wear a 7 day monitor to make sure he is constantly in atrial fibrillation before stopping sotalol.

## 2016-05-17 NOTE — Telephone Encounter (Signed)
Pt states at time of last office visit June 2017 he and Dr Shirlee LatchMcLean discussed changing sotalol to a different medication when pt had his follow up appt in 6 months.  Pt states he understood ED could be a side effect of sotalol. Pt states he uses Viagra prn but does not seem to help ED. Pt states he and Dr Shirlee LatchMcLean discussed changing to another medication that would not have this side effect.  Pt states he would like to go ahead and change medications now instead of waiting 6 months.   Pt advised that I will forward to Dr Shirlee LatchMcLean for review.

## 2016-05-17 NOTE — Telephone Encounter (Signed)
Patient called in to see if he could be taken off sotalol and put on something different. He would like call back from nurse to discuss this. Patient stated the sotalol is just not working for him. His callback number is [phone]

## 2016-05-19 ENCOUNTER — Telehealth: Payer: Self-pay | Admitting: Cardiology

## 2016-05-19 NOTE — Telephone Encounter (Signed)
New message      Pt states he called 2 days ago and the nurse was to call him back after consulting with Dr Shirlee LatchMcLean.  He has not heard back from her.  Please call

## 2016-05-19 NOTE — Telephone Encounter (Signed)
Left message to call back  

## 2016-05-19 NOTE — Telephone Encounter (Signed)
See phone note from 6/20

## 2016-05-19 NOTE — Addendum Note (Signed)
Addended by: Julio SicksBOWERS, JENNIFER L on: 05/19/2016 11:19 AM   Modules accepted: Orders

## 2016-05-19 NOTE — Telephone Encounter (Signed)
Spoke with pt and informed him of orders from Dr. Shirlee LatchMcLean for monitor. Pt verbalized understanding and was in agreement with this plan.

## 2016-05-20 ENCOUNTER — Other Ambulatory Visit: Payer: Self-pay | Admitting: Cardiology

## 2016-05-20 ENCOUNTER — Ambulatory Visit (INDEPENDENT_AMBULATORY_CARE_PROVIDER_SITE_OTHER): Payer: 59

## 2016-05-20 DIAGNOSIS — I480 Paroxysmal atrial fibrillation: Secondary | ICD-10-CM | POA: Diagnosis not present

## 2016-06-01 ENCOUNTER — Telehealth: Payer: Self-pay | Admitting: *Deleted

## 2016-06-01 ENCOUNTER — Other Ambulatory Visit: Payer: Self-pay | Admitting: Cardiology

## 2016-06-01 MED ORDER — METOPROLOL SUCCINATE ER 50 MG PO TB24: 50.0000 mg | ORAL_TABLET | Freq: Two times a day (BID) | ORAL | Status: DC

## 2016-06-01 NOTE — Telephone Encounter (Signed)
Ok to change this order to a ninety day supply?

## 2016-06-01 NOTE — Telephone Encounter (Signed)
Notes Recorded by Laurey Moralealton S McLean, MD on 05/30/2016 at 9:57 PM He was in atrial fibrillation 98% of the time, very rare NSR. I think he can stop sotalol and start on Toprol XL 50 mg bid. Have him get an appointment in atrial fibrillation clinic with [name] in a couple of weeks to make sure that rate control is adequate, as avg HR was 120 on sotalol.

## 2016-06-01 NOTE — Telephone Encounter (Signed)
Discussed with pt, pt agreed with plan

## 2016-06-01 NOTE — Telephone Encounter (Signed)
yes

## 2016-06-13 ENCOUNTER — Other Ambulatory Visit: Payer: Self-pay | Admitting: Cardiology

## 2016-06-16 ENCOUNTER — Encounter (HOSPITAL_COMMUNITY): Payer: Self-pay | Admitting: Nurse Practitioner

## 2016-06-16 ENCOUNTER — Ambulatory Visit (HOSPITAL_COMMUNITY)
Admission: RE | Admit: 2016-06-16 | Discharge: 2016-06-16 | Disposition: A | Discharge status: Home / Self Care | Payer: 59 | Source: Ambulatory Visit | Attending: Nurse Practitioner | Admitting: Nurse Practitioner

## 2016-06-16 VITALS — BP 146/98 | HR 76 | Ht 72.0 in | Wt 236.6 lb

## 2016-06-16 DIAGNOSIS — Z7722 Contact with and (suspected) exposure to environmental tobacco smoke (acute) (chronic): Secondary | ICD-10-CM | POA: Diagnosis not present

## 2016-06-16 DIAGNOSIS — I10 Essential (primary) hypertension: Secondary | ICD-10-CM | POA: Insufficient documentation

## 2016-06-16 DIAGNOSIS — I481 Persistent atrial fibrillation: Secondary | ICD-10-CM

## 2016-06-16 DIAGNOSIS — Z7901 Long term (current) use of anticoagulants: Secondary | ICD-10-CM | POA: Insufficient documentation

## 2016-06-16 DIAGNOSIS — E113299 Type 2 diabetes mellitus with mild nonproliferative diabetic retinopathy without macular edema, unspecified eye: Secondary | ICD-10-CM | POA: Diagnosis not present

## 2016-06-16 DIAGNOSIS — E781 Pure hyperglyceridemia: Secondary | ICD-10-CM | POA: Diagnosis not present

## 2016-06-16 DIAGNOSIS — Z7984 Long term (current) use of oral hypoglycemic drugs: Secondary | ICD-10-CM | POA: Diagnosis not present

## 2016-06-16 DIAGNOSIS — Z79899 Other long term (current) drug therapy: Secondary | ICD-10-CM | POA: Insufficient documentation

## 2016-06-16 DIAGNOSIS — E669 Obesity, unspecified: Secondary | ICD-10-CM | POA: Insufficient documentation

## 2016-06-16 DIAGNOSIS — I4891 Unspecified atrial fibrillation: Secondary | ICD-10-CM | POA: Diagnosis present

## 2016-06-16 DIAGNOSIS — E119 Type 2 diabetes mellitus without complications: Secondary | ICD-10-CM | POA: Insufficient documentation

## 2016-06-16 DIAGNOSIS — I480 Paroxysmal atrial fibrillation: Secondary | ICD-10-CM | POA: Diagnosis not present

## 2016-06-16 DIAGNOSIS — I4819 Other persistent atrial fibrillation: Secondary | ICD-10-CM

## 2016-06-16 NOTE — Progress Notes (Signed)
Patient ID: George Carr, male   DOB: 1956/11/04, 60 y.o.   MRN: [id number]      Primary Care Physician: George Carr Referring Physician:  Dr Lawerance Carr   George Carr is a 60 y.o. male with a h/o 60 yo with history of type II diabetes, HTN, and paroxysmal atrial fibrillation. Atrial fibrillation was first noted in 2/15 when he went for a colonoscopy. He was unaware of it, no symptoms. He was started on sotalol and Xarelto. He went back into NSR .   Previous episode of presyncope, when he was walking around in the am, after eating breakfast, got dizzy and fell to the floor, did not have LOC. He went to the doctor and ekg showed afib at 108 bpm. He is unaware of heart beat and does not feel palpitations. He wore a monitor wjich showed some PAF. Sotalol was increased to 120 mg bid and he returns today feeling well, not aware of any irregular heartbeat or further dizziness. Had a physical with his Carr this week and labs were normal per patient. (NOT IN EPIC). Stated his Carr was very pleased with lab results. EKG does reveal SR. Wearing Cpap consistently.   Afib clinic f/u 06/16/16. He was seen by George Carr 6/11 and found to be in afib. Sotalol was continued but 48 hour monitor was placed. He was in afib for the majority of the time. Pt felt well and was not aware that he was in afib. Sotalol was stopped and toprol was added. George Carr wanted him seen today to make sure he was rate controlled, but by surprise he is in SR, He can't tell any difference when he  is in SR vrs afib. Tolerating BB well. Continues on apixaban for chadsvasc score of at least 2.  Today, he denies symptoms of palpitations, chest pain, shortness of breath, orthopnea, PND, lower extremity edema, dizziness, presyncope, syncope, or neurologic sequela. The patient is tolerating medications without difficulties and is otherwise without complaint today.   Past Medical History  Diagnosis Date  . Atrial fibrillation (HCC)    . Essential hypertension, benign   . Type II or unspecified type diabetes mellitus without mention of complication, not stated as uncontrolled   . 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 Obesity, unspecified   . Pure hyperglyceridemia   . Personal history of other specified diseases(V13.89)    Past Surgical History  Procedure Laterality Date  . Knee arthrotomy Right 1978  . Cornea radial keratotomy Bilateral 2005  . Nasal septoplasty w/ turbinoplasty  1999    Current Outpatient Prescriptions  Medication Sig Dispense Refill  . amLODipine (NORVASC) 10 MG tablet TAKE 1 TABLET(10 MG) BY MOUTH DAILY 90 tablet 3  . apixaban (ELIQUIS) 5 MG TABS tablet Take 1 tablet (5 mg total) by mouth 2 (two) times daily. 60 tablet 10  . atorvastatin (LIPITOR) 10 MG tablet TAKE 1/2 TABLET BY MOUTH EVERY DAY 45 tablet 3  . lisinopril (PRINIVIL,ZESTRIL) 40 MG tablet TAKE 1 TABLET(40 MG) BY MOUTH DAILY 90 tablet 0  . metFORMIN (GLUCOPHAGE-XR) 500 MG 24 hr tablet Take 2,000 mg by mouth daily with breakfast.    . metoprolol succinate (TOPROL-XL) 50 MG 24 hr tablet TAKE 1 TABLET BY MOUTH TWICE DAILY. TAKE WITH OR IMMEDIATELY FOLLOWING A MEAL 180 tablet 0  . ONE TOUCH ULTRA TEST test strip CHECK BLOOD SUGAR ONCE D  5  . potassium chloride SA (K-DUR,KLOR-CON) 20 MEQ tablet TAKE 1 TABLET(20 MEQ) BY  MOUTH DAILY 30 tablet 3   No current facility-administered medications for this encounter.    Allergies  Allergen Reactions  . Penicillins Hives    Social History   Social History  . Marital Status: Married    Spouse Name: N/A  . Number of Children: N/A  . Years of Education: N/A   Occupational History  . Not on file.   Social History Main Topics  . Smoking status: Passive Smoke Exposure - Never Smoker  . Smokeless tobacco: Not on file  . Alcohol Use: Yes     Comment: seldom  . Drug Use: No  . Sexual Activity: Not on file   Other Topics Concern  . Not on file   Social History  Narrative    Family History  Problem Relation Age of Onset  . Diabetes    . Hypertension Father   . Other Father     pat/mat uncles, malignant neoplasm of the prostate gland  . Stroke Father   . Diabetes Mother   . Heart disease Maternal Aunt     ROS-as per HPI above  Physical Exam: Filed Vitals:   06/16/16 0837  BP: 146/98  Pulse: 76  Height: 6' (1.829 m)  Weight: 236 lb 9.6 oz (107.321 kg)    GEN- The patient is well appearing, alert and oriented x 3 today.   Head- normocephalic, atraumatic Eyes-  Sclera clear, conjunctiva pink Ears- hearing intact Oropharynx- clear Neck- supple, no JVP Lymph- no cervical lymphadenopathy Lungs- Clear to ausculation bilaterally, normal work of breathing Heart- Regular rate and rhythm, no murmurs, rubs or gallops, PMI not laterally displaced GI- soft, NT, ND, + BS Extremities- no clubbing, cyanosis, or edema MS- no significant deformity or atrophy Skin- no rash or lesion Psych- euthymic mood, full affect Neuro- strength and sensation are intact  EKG- NSR at 76 bpm, normal EKG Holter monitor reviewed, predominately afib with rate NSR, average HR 120 bpm.  Assessment and Plan:   1. Asymptomatic  afib Today in SR, feels well Off sotalol Continue metoprolol ER 50 mg a day Continue Eliquis. Continue CPAP.  2.HTN Mildly elevated today but he states at home sys's less than 140    F/u with George. Shirlee Latch as scheduled in December   [name] C. [facility] Folks [facility] [address] [place], [place] [address] [phone]

## 2016-06-29 ENCOUNTER — Other Ambulatory Visit: Payer: Self-pay | Admitting: Cardiology

## 2016-06-29 DIAGNOSIS — I480 Paroxysmal atrial fibrillation: Secondary | ICD-10-CM

## 2016-08-23 ENCOUNTER — Encounter (HOSPITAL_COMMUNITY): Payer: Self-pay | Admitting: Nurse Practitioner

## 2016-08-23 ENCOUNTER — Ambulatory Visit (HOSPITAL_COMMUNITY)
Admission: RE | Admit: 2016-08-23 | Discharge: 2016-08-23 | Disposition: A | Discharge status: Home / Self Care | Payer: 59 | Source: Ambulatory Visit | Attending: Nurse Practitioner | Admitting: Nurse Practitioner

## 2016-08-23 ENCOUNTER — Telehealth: Payer: Self-pay | Admitting: Cardiology

## 2016-08-23 ENCOUNTER — Other Ambulatory Visit (HOSPITAL_COMMUNITY): Payer: Self-pay | Admitting: *Deleted

## 2016-08-23 VITALS — BP 118/92 | HR 102 | Ht 72.0 in | Wt 231.2 lb

## 2016-08-23 DIAGNOSIS — I480 Paroxysmal atrial fibrillation: Secondary | ICD-10-CM | POA: Diagnosis not present

## 2016-08-23 DIAGNOSIS — Z9889 Other specified postprocedural states: Secondary | ICD-10-CM | POA: Diagnosis not present

## 2016-08-23 DIAGNOSIS — R55 Syncope and collapse: Secondary | ICD-10-CM | POA: Diagnosis present

## 2016-08-23 DIAGNOSIS — Z7984 Long term (current) use of oral hypoglycemic drugs: Secondary | ICD-10-CM | POA: Insufficient documentation

## 2016-08-23 DIAGNOSIS — Z7722 Contact with and (suspected) exposure to environmental tobacco smoke (acute) (chronic): Secondary | ICD-10-CM | POA: Insufficient documentation

## 2016-08-23 DIAGNOSIS — E781 Pure hyperglyceridemia: Secondary | ICD-10-CM | POA: Diagnosis not present

## 2016-08-23 DIAGNOSIS — E669 Obesity, unspecified: Secondary | ICD-10-CM | POA: Insufficient documentation

## 2016-08-23 DIAGNOSIS — Z880 Allergy status to penicillin: Secondary | ICD-10-CM | POA: Diagnosis not present

## 2016-08-23 DIAGNOSIS — Z79899 Other long term (current) drug therapy: Secondary | ICD-10-CM | POA: Insufficient documentation

## 2016-08-23 DIAGNOSIS — E119 Type 2 diabetes mellitus without complications: Secondary | ICD-10-CM | POA: Insufficient documentation

## 2016-08-23 DIAGNOSIS — I10 Essential (primary) hypertension: Secondary | ICD-10-CM | POA: Diagnosis not present

## 2016-08-23 DIAGNOSIS — Z7901 Long term (current) use of anticoagulants: Secondary | ICD-10-CM | POA: Insufficient documentation

## 2016-08-23 MED ORDER — LISINOPRIL 40 MG PO TABS: 20.0000 mg | ORAL_TABLET | Freq: Two times a day (BID) | ORAL | 0 refills | Status: DC

## 2016-08-23 MED ORDER — METOPROLOL SUCCINATE ER 50 MG PO TB24: 50.0000 mg | ORAL_TABLET | Freq: Every day | ORAL | 11 refills | Status: DC

## 2016-08-23 NOTE — Telephone Encounter (Signed)
Patient scheduled to see George Carr in AFib clinic at 11:30 to be evaluated.

## 2016-08-23 NOTE — Patient Instructions (Signed)
Your physician has recommended you make the following change in your medication:  1)Change lisinopril to 1/2 tablet twice a day 2)Change metoprolol to bedtime   LINQ placement with Dr. Johney FrameAllred scheduled for Thursday, October 5th  - Arrive at the [facility] and go to admitting at [id number]  - Take all your medication with a sip of water prior to arrival.

## 2016-08-23 NOTE — Telephone Encounter (Signed)
Patient reports presyncopal episode this morning.  Questionable if this is actually a syncopal episode as patient reports feeling "different and funny" a few minutes before episode.  States this occurred once before in 2015 but he did not have "warning" before that one.  Patient has hx of Afib and does not know when he is/is not in AFib.   He was unable to obtain any VS as he was on a job site at the time of incident.  He denies any recent illness.  States he did have breakfast. Reviewed with DOD, Dr. Judd GaudierSkain's - advise patient to see Lupita LeashDonna in Afib clinic this morning (? syncope/presyncope related to post termination pauses). If unable to make appt, then pt needs to be seen in E.R.  Have attempted to reach patient several times with it going straight to [employer] message, unable to leave voicemail.  Will continue to try and reach patient.

## 2016-08-23 NOTE — Progress Notes (Signed)
Patient ID: George Carr, male   DOB: 1956/03/26, 60 y.o.   MRN: [id number]      Primary Care Physician: Sid Falcon, MD Referring Physician:  [name] is a 60 y.o. male with a h/o 60 yo with history of type II diabetes, HTN, and paroxysmal atrial fibrillation. Atrial fibrillation was first noted in 2/15 when he went for a colonoscopy. He was unaware of it, no symptoms. He was started on sotalol and Xarelto. He went back into NSR .   Seen 10/ 2015 by Dr. Johney Frame,  episode of presyncope, when he was walking around in the am, after eating breakfast, got dizzy and fell to the floor, did not have LOC. He went to the doctor and ekg showed afib at 108 bpm. He is unaware of heart beat and does not feel palpitations. He wore a monitor wich showed some PAF.  Sotalol was increased to 120 mg bid, by Dr. Johney Frame and he returned to afib clinic, 11/15,   feeling well, not aware of any irregular heartbeat or further dizziness. Had a physical with his MD this week and labs were normal per patient. (NOT IN EPIC). Stated his MD was very pleased with lab results. EKG does reveal SR. Wearing Cpap consistently.   Afib clinic f/u 06/16/16. He was seen by Dr. Shirlee Latch 05/08/16, and found to be in afib. Sotalol was continued but 48 hour monitor was placed. He was in afib for the majority of the time. Pt felt well and was not aware that he was in afib. Sotalol was stopped and toprol was added. Dr. Shirlee Latch wanted him seen  In afib clinic, 7/17,  to make sure he was rate controlled, but by surprise he was in SR, He can't tell any difference when he  is in SR vrs afib. Tolerating BB well. Continues on apixaban for chadsvasc score of at least 2.  Urgently seen in afib clinic, 9/26, for syncopal episode this am. Had been in his usual state of health. Went to a early meeting at work and was standing outside talking to a couple of co-workers when he felt lightheaded and next thing he was on the ground. His  co- workers remarked as soon as he hit the ground, his eyes were open and he was immediately mentally appropriate. He was not aware of any change in his heart rate. He was driven home and made an appointment for the clinic. Ekg showed afib with v rate at 102 bpm. Standing BP in office 130/80. He is not aware of irregular heart beat. Feels back to baseline. He does take all of BP pills in the am. Denies any diarrhea, heavy sweating or other reason to be dehydrated. Just had physical with his PCP with labs 3 weeks ago and was told everything was good.  Today, he denies symptoms of palpitations, chest pain, shortness of breath, orthopnea, PND, lower extremity edema. Posiitive for dizziness presyncope, syncope.. The patient is tolerating medications without difficulties and is otherwise without complaint today.   Past Medical History:  Diagnosis Date  . Atrial fibrillation (HCC)   . Essential hypertension, benign   . 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 Obesity, unspecified   . Personal history of other specified diseases(V13.89)   . Pure hyperglyceridemia   . Type II or unspecified type diabetes mellitus without mention of complication, not stated as uncontrolled    Past Surgical History:  Procedure Laterality Date  . cornea radial keratotomy Bilateral  2005  . KNEE ARTHROTOMY Right 1978  . NASAL SEPTOPLASTY W/ TURBINOPLASTY  1999    Current Outpatient Prescriptions  Medication Sig Dispense Refill  . amLODipine (NORVASC) 10 MG tablet TAKE 1 TABLET(10 MG) BY MOUTH DAILY 90 tablet 3  . apixaban (ELIQUIS) 5 MG TABS tablet Take 1 tablet (5 mg total) by mouth 2 (two) times daily. 60 tablet 10  . atorvastatin (LIPITOR) 10 MG tablet TAKE 1/2 TABLET BY MOUTH EVERY DAY 45 tablet 3  . lisinopril (PRINIVIL,ZESTRIL) 40 MG tablet Take 0.5 tablets (20 mg total) by mouth 2 (two) times daily. 90 tablet 0  . metFORMIN (GLUCOPHAGE-XR) 500 MG 24 hr tablet Take 2,000 mg by mouth daily with  breakfast.    . metoprolol succinate (TOPROL-XL) 50 MG 24 hr tablet Take 1 tablet (50 mg total) by mouth at bedtime. Take with or immediately following a meal. 180 tablet 11  . ONE TOUCH ULTRA TEST test strip CHECK BLOOD SUGAR ONCE D  5  . potassium chloride SA (K-DUR,KLOR-CON) 20 MEQ tablet TAKE 1 TABLET(20 MEQ) BY MOUTH DAILY 30 tablet 3   No current facility-administered medications for this encounter.     Allergies  Allergen Reactions  . Penicillins Hives    Social History   Social History  . Marital status: Married    Spouse name: N/A  . Number of children: N/A  . Years of education: N/A   Occupational History  . Not on file.   Social History Main Topics  . Smoking status: Passive Smoke Exposure - Never Smoker  . Smokeless tobacco: Not on file  . Alcohol use Yes     Comment: seldom  . Drug use: No  . Sexual activity: Not on file   Other Topics Concern  . Not on file   Social History Narrative  . No narrative on file    Family History  Problem Relation Age of Onset  . Diabetes    . Hypertension Father   . Other Father     pat/mat uncles, malignant neoplasm of the prostate gland  . Stroke Father   . Diabetes Mother   . Heart disease Maternal Aunt     ROS-as per HPI above  Physical Exam: Vitals:   08/23/16 1146  BP: (!) 118/92  BP Location: Left Arm  Patient Position: Sitting  Cuff Size: Normal  Pulse: (!) 102  Weight: 231 lb 3.2 oz (104.9 kg)  Height: 6' (1.829 m)    GEN- The patient is well appearing, alert and oriented x 3 today.   Head- normocephalic, atraumatic Eyes-  Sclera clear, conjunctiva pink Ears- hearing intact Oropharynx- clear Neck- supple, no JVP Lymph- no cervical lymphadenopathy Lungs- Clear to ausculation bilaterally, normal work of breathing Heart- irregular rate and rhythm, no murmurs, rubs or gallops, PMI not laterally displaced GI- soft, NT, ND, + BS Extremities- no clubbing, cyanosis, or edema MS- no significant  deformity or atrophy Skin- no rash or lesion Psych- euthymic mood, full affect Neuro- strength and sensation are intact  EKG- Afib at 102 bpm, qrs int 84 ms, qtc 430 ms Epic records reviewed  Assessment and Plan:   1. Syncope Hard to determine what caused syncope, ? if pt had been in SR and became symptomatic with return of afib Also question if BP may have dropped since pt takes all BP meds in Am Will split lisinopril to 20 mg bid instead of 40 mg daily, Move toprol to bedtime, continue amlodipine in am. Has been off  sotalol for some time due to failure to keep in SR Continue Eliquis. Continue CPAP Advised not to drive x 6 months Discussed with Dr. 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 Johney FrameAllred 10/11  F/u with Dr. Shirlee LatchMcLean as  On recall in December   Lupita Leashonna C. Matthew Folksarroll, ANP-C [facility] [address] [place], [place] [id number] [phone]

## 2016-08-23 NOTE — Telephone Encounter (Signed)
Pt c/o Syncope: STAT if syncope occurred within 30 minutes and pt complains of lightheadedness High Priority if episode of passing out, completely, today or in last 24 hours   1. Did you pass out today? Yes @ 9:30am 08-23-16  2. When is the last time you passed out? 2 years ago   3. Has this occurred multiple times? no   4. Did you have any symptoms prior to passing out? Pt just felt different he verbalized that he has never felt like that before

## 2016-08-31 ENCOUNTER — Telehealth: Payer: Self-pay | Admitting: Internal Medicine

## 2016-08-31 NOTE — Telephone Encounter (Signed)
I spoke with the patient. He states he had no idea how expensive this procedure was and will have to cancel it.  He states his insurance will pay on the procedure, but he still can not afford the "bills" after. He states he has ECHO sch 10/11 and will speak with office then in regards to issue. Patient insists we cancel the procedure at this time. [facility], Dr. Jenel LucksAllred's nurse.

## 2016-08-31 NOTE — Telephone Encounter (Signed)
New Message  Pt call requesting to speak with RN. Pt call to cancel cath procedure. Pt states procedure is to costly. Please call back to discuss

## 2016-08-31 NOTE — Telephone Encounter (Signed)
Talked with patient. LINQ canceled for tomorrow. Pt has appointment with Dr. Johney FrameAllred next week for consultation. Will discuss further at that time.

## 2016-09-01 ENCOUNTER — Encounter (HOSPITAL_COMMUNITY): Admission: RE | Payer: Self-pay | Source: Ambulatory Visit

## 2016-09-01 ENCOUNTER — Ambulatory Visit (HOSPITAL_COMMUNITY): Admission: RE | Admit: 2016-09-01 | Payer: 59 | Source: Ambulatory Visit | Admitting: Internal Medicine

## 2016-09-01 SURGERY — LOOP RECORDER INSERTION

## 2016-09-07 ENCOUNTER — Ambulatory Visit (HOSPITAL_COMMUNITY)
Admission: RE | Admit: 2016-09-07 | Discharge: 2016-09-07 | Disposition: A | Discharge status: Home / Self Care | Payer: 59 | Source: Ambulatory Visit | Attending: Nurse Practitioner | Admitting: Nurse Practitioner

## 2016-09-07 ENCOUNTER — Ambulatory Visit (HOSPITAL_BASED_OUTPATIENT_CLINIC_OR_DEPARTMENT_OTHER)
Admission: RE | Admit: 2016-09-07 | Discharge: 2016-09-07 | Disposition: A | Discharge status: Home / Self Care | Payer: 59 | Source: Ambulatory Visit | Attending: Nurse Practitioner | Admitting: Nurse Practitioner

## 2016-09-07 ENCOUNTER — Encounter (HOSPITAL_COMMUNITY): Payer: Self-pay | Admitting: Nurse Practitioner

## 2016-09-07 VITALS — BP 144/88 | HR 78 | Ht 72.0 in | Wt 232.6 lb

## 2016-09-07 DIAGNOSIS — Z7901 Long term (current) use of anticoagulants: Secondary | ICD-10-CM | POA: Insufficient documentation

## 2016-09-07 DIAGNOSIS — Z833 Family history of diabetes mellitus: Secondary | ICD-10-CM | POA: Insufficient documentation

## 2016-09-07 DIAGNOSIS — I480 Paroxysmal atrial fibrillation: Secondary | ICD-10-CM | POA: Insufficient documentation

## 2016-09-07 DIAGNOSIS — Z8249 Family history of ischemic heart disease and other diseases of the circulatory system: Secondary | ICD-10-CM | POA: Diagnosis not present

## 2016-09-07 DIAGNOSIS — E669 Obesity, unspecified: Secondary | ICD-10-CM | POA: Diagnosis not present

## 2016-09-07 DIAGNOSIS — R55 Syncope and collapse: Secondary | ICD-10-CM | POA: Diagnosis not present

## 2016-09-07 DIAGNOSIS — G4733 Obstructive sleep apnea (adult) (pediatric): Secondary | ICD-10-CM | POA: Insufficient documentation

## 2016-09-07 DIAGNOSIS — Z7722 Contact with and (suspected) exposure to environmental tobacco smoke (acute) (chronic): Secondary | ICD-10-CM | POA: Diagnosis not present

## 2016-09-07 DIAGNOSIS — Z823 Family history of stroke: Secondary | ICD-10-CM | POA: Insufficient documentation

## 2016-09-07 DIAGNOSIS — Z880 Allergy status to penicillin: Secondary | ICD-10-CM | POA: Insufficient documentation

## 2016-09-07 DIAGNOSIS — E781 Pure hyperglyceridemia: Secondary | ICD-10-CM | POA: Insufficient documentation

## 2016-09-07 DIAGNOSIS — I10 Essential (primary) hypertension: Secondary | ICD-10-CM

## 2016-09-07 DIAGNOSIS — Z7984 Long term (current) use of oral hypoglycemic drugs: Secondary | ICD-10-CM | POA: Diagnosis not present

## 2016-09-07 DIAGNOSIS — E119 Type 2 diabetes mellitus without complications: Secondary | ICD-10-CM | POA: Insufficient documentation

## 2016-09-07 LAB — ECHOCARDIOGRAM COMPLETE
CHL CUP MV DEC (S): 173
CHL CUP PV REG GRAD DIAS: 8 mmHg
E/e' ratio: 5.38
EWDT: 173 ms
FS: 32 % (ref 28–44)
IVS/LV PW RATIO, ED: 1.16
LA vol: 51.5 mL
LADIAMINDEX: 2.07 cm/m2
LASIZE: 47 mm
LAVOLA4C: 41.6 mL
LAVOLIN: 22.7 mL/m2
LDCA: 4.15 cm2
LEFT ATRIUM END SYS DIAM: 47 mm
LV PW d: 9.63 mm — AB (ref 0.6–1.1)
LV TDI E'LATERAL: 13.8
LV TDI E'MEDIAL: 5.55
LVEEAVG: 5.38
LVEEMED: 5.38
LVELAT: 13.8 cm/s
LVOTD: 23 mm
Lateral S' vel: 13.8 cm/s
MV Peak grad: 2 mmHg
MV pk A vel: 65 m/s
MVPKEVEL: 74.2 m/s
PV Reg vel dias: 143 cm/s
TAPSE: 27.7 mm

## 2016-09-07 NOTE — Patient Instructions (Signed)
LINQ placement scheduled for Friday, October 13th  - Arrive at the [facility] and go to admitting at 6:30AM  Follow up for 10 day incision check - scheduler will call to schedule this for you.

## 2016-09-07 NOTE — Progress Notes (Signed)
  Echocardiogram 2D Echocardiogram has been performed.  Delcie RochENNINGTON, Nyazia Canevari 09/07/2016, 11:45 AM

## 2016-09-07 NOTE — Progress Notes (Signed)
Primary Care Physician: Sid Falcon, MD Primary Cardiologist: Dr Shirlee Latch Primary Electrophysiologist: Dr Cleotis Lema is a 60 y.o. male with a history of paroxysmal atrial fibrillation who presents for follow up in the [facility].  He recently had a syncopal episode.  Etiology is unclear though he is felt to likely have had a post termination pause with his afib.  He has done well since and remains active.  He is otherwise completely asymptomatic with his afib.  Today, he  denies symptoms of palpitations, chest pain, shortness of breath, orthopnea, PND, lower extremity edema, dizziness, presyncope, recurrent syncope, snoring, daytime somnolence, bleeding, or neurologic sequela. The patient is tolerating medications without difficulties and is otherwise without complaint today.    Atrial Fibrillation Risk Factors:  he does have symptoms or diagnosis of sleep apnea. he is compliant with CPAP therapy.  he does not have a history of rheumatic fever.  he does not have a history of alcohol use.  he has a BMI of Body mass index is 31.55 kg/m.Marland Kitchen Filed Weights   09/07/16 1335  Weight: 232 lb 9.6 oz (105.5 kg)    LA size: 47mm   Atrial Fibrillation Management history:  Previous antiarrhythmic drugs: sotalol, stopped due to ineffectiveness   Previous ablations: none  CHADS2VASC score: 2  Anticoagulation history: xarelto previously, currently on eliquis   Past Medical History:  Diagnosis Date  . Atrial fibrillation (HCC)   . Essential hypertension, benign   . 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 Obesity, unspecified   . Personal history of other specified diseases(V13.89)   . Pure hyperglyceridemia   . Type II or unspecified type diabetes mellitus without mention of complication, not stated as uncontrolled    Past Surgical History:  Procedure Laterality Date  . cornea radial keratotomy Bilateral 2005  . KNEE  ARTHROTOMY Right 1978  . NASAL SEPTOPLASTY W/ TURBINOPLASTY  1999    Current Outpatient Prescriptions  Medication Sig Dispense Refill  . amLODipine (NORVASC) 10 MG tablet TAKE 1 TABLET(10 MG) BY MOUTH DAILY 90 tablet 3  . apixaban (ELIQUIS) 5 MG TABS tablet Take 1 tablet (5 mg total) by mouth 2 (two) times daily. (Patient taking differently: Take 5 mg by mouth daily. ) 60 tablet 10  . atorvastatin (LIPITOR) 10 MG tablet TAKE 1/2 TABLET BY MOUTH EVERY DAY 45 tablet 3  . Liraglutide (VICTOZA) 18 MG/3ML SOPN Inject 1.8 mg into the skin daily.     Marland Kitchen lisinopril (PRINIVIL,ZESTRIL) 40 MG tablet Take 0.5 tablets (20 mg total) by mouth 2 (two) times daily. 90 tablet 0  . loratadine (CLARITIN) 10 MG tablet Take 10 mg by mouth daily as needed for allergies.    . metFORMIN (GLUCOPHAGE-XR) 500 MG 24 hr tablet Take 2,000 mg by mouth daily with breakfast.    . metoprolol succinate (TOPROL-XL) 50 MG 24 hr tablet Take 1 tablet (50 mg total) by mouth at bedtime. Take with or immediately following a meal. (Patient taking differently: Take 50 mg by mouth 2 (two) times daily. Take with or immediately following a meal.) 180 tablet 11  . Omega-3 Fatty Acids (FISH OIL) 1000 MG CAPS Take 1,000 mg by mouth daily.    . ONE TOUCH ULTRA TEST test strip CHECK BLOOD SUGAR ONCE D  5  . sildenafil (REVATIO) 20 MG tablet Take 80 mg by mouth as needed (ED).     Marland Kitchen ibuprofen (ADVIL,MOTRIN) 200 MG tablet Take 200 mg by mouth  every 6 (six) hours as needed.    Marland Kitchen. ibuprofen (ADVIL,MOTRIN) 600 MG tablet Take 600 mg by mouth every 6 (six) hours as needed for moderate pain.    . potassium chloride SA (K-DUR,KLOR-CON) 20 MEQ tablet TAKE 1 TABLET(20 MEQ) BY MOUTH DAILY (Patient not taking: Reported on 09/07/2016) 30 tablet 3   No current facility-administered medications for this encounter.     Allergies  Allergen Reactions  . Penicillins Hives    Has patient had a PCN reaction causing immediate rash, facial/tongue/throat swelling, SOB  or lightheadedness with hypotension: Yes Has patient had a PCN reaction causing severe rash involving mucus membranes or skin necrosis: No Has patient had a PCN reaction that required hospitalization No Has patient had a PCN reaction occurring within the last 10 years: No If all of the above answers are "NO", then may proceed with Cephalosporin use.     Social History   Social History  . Marital status: Married    Spouse name: N/A  . Number of children: N/A  . Years of education: N/A   Occupational History  . Not on file.   Social History Main Topics  . Smoking status: Passive Smoke Exposure - Never Smoker  . Smokeless tobacco: Not on file  . Alcohol use Yes     Comment: seldom  . Drug use: No  . Sexual activity: Not on file   Other Topics Concern  . Not on file   Social History Narrative  . No narrative on file    Family History  Problem Relation Age of Onset  . Diabetes    . Hypertension Father   . Other Father     pat/mat uncles, malignant neoplasm of the prostate gland  . Stroke Father   . Diabetes Mother   . Heart disease Maternal Aunt     ROS- All systems are reviewed and negative except as per the HPI above.  Physical Exam: Vitals:   09/07/16 1335  BP: (!) 144/88  Pulse: 78  Weight: 232 lb 9.6 oz (105.5 kg)  Height: 6' (1.829 m)    GEN- The patient is well appearing, alert and oriented x 3 today.   Head- normocephalic, atraumatic Eyes-  Sclera clear, conjunctiva pink Ears- hearing intact Oropharynx- clear Neck- supple  Lungs- Clear to ausculation bilaterally, normal work of breathing Heart- Regular rate and rhythm, no murmurs, rubs or gallops  GI- soft, NT, ND, + BS Extremities- no clubbing, cyanosis, or edema MS- no significant deformity or atrophy Skin- no rash or lesion Psych- euthymic mood, full affect Neuro- strength and sensation are intact  Wt Readings from Last 3 Encounters:  09/07/16 232 lb 9.6 oz (105.5 kg)  08/23/16 231 lb 3.2  oz (104.9 kg)  06/16/16 236 lb 9.6 oz (107.3 kg)    EKG today demonstrates sinus rhythm, normal ekg, QTc 446 msec Echo today demonstrated EF 55%, moderate LA enlargement  Epic records are reviewed at length today  Assessment and Plan:  1. Paroxysmal atrial fibrillation The patient is asymptomatic with his afib Rate control seems to be a good strategy for him Continue eliquis for CHADS2VASC of 2  2. syncope Unclear etiology ekg and echo are low risk Post termination pause with afib is suspected. I have advised implantable loop recorder for further evaluation.  Holter 6/17 revealed AF with RVR but no other arrhythmias.  He is willing to proceed with ILR placement.  Risks of procedure discussed with the patient who wishes to proceed. I have  been very clear that he should not drive for 6 months.  3. HTN Stable No change required today  4. Obstructive sleep apnea The importance of adequate treatment of sleep apnea was discussed today in order to improve our ability to maintain sinus rhythm long term. The patient reports compliance with CPAP   Follow-up with Lupita Leash 3 months s/p ILR placement.  Hillis Range, MD 09/07/2016 5:39 PM

## 2016-09-09 ENCOUNTER — Ambulatory Visit (HOSPITAL_COMMUNITY)
Admission: RE | Admit: 2016-09-09 | Discharge: 2016-09-09 | Disposition: A | Discharge status: Home / Self Care | Payer: 59 | Source: Ambulatory Visit | Attending: Internal Medicine | Admitting: Internal Medicine

## 2016-09-09 ENCOUNTER — Encounter (HOSPITAL_COMMUNITY): Payer: Self-pay | Admitting: *Deleted

## 2016-09-09 ENCOUNTER — Encounter (HOSPITAL_COMMUNITY)
Admission: RE | Disposition: A | Discharge status: Home / Self Care | Payer: Self-pay | Source: Ambulatory Visit | Attending: Internal Medicine

## 2016-09-09 DIAGNOSIS — Z833 Family history of diabetes mellitus: Secondary | ICD-10-CM | POA: Insufficient documentation

## 2016-09-09 DIAGNOSIS — E781 Pure hyperglyceridemia: Secondary | ICD-10-CM | POA: Insufficient documentation

## 2016-09-09 DIAGNOSIS — G4733 Obstructive sleep apnea (adult) (pediatric): Secondary | ICD-10-CM | POA: Diagnosis not present

## 2016-09-09 DIAGNOSIS — Z6831 Body mass index (BMI) 31.0-31.9, adult: Secondary | ICD-10-CM | POA: Diagnosis not present

## 2016-09-09 DIAGNOSIS — Z7984 Long term (current) use of oral hypoglycemic drugs: Secondary | ICD-10-CM | POA: Diagnosis not present

## 2016-09-09 DIAGNOSIS — R55 Syncope and collapse: Secondary | ICD-10-CM | POA: Diagnosis present

## 2016-09-09 DIAGNOSIS — E669 Obesity, unspecified: Secondary | ICD-10-CM | POA: Diagnosis not present

## 2016-09-09 DIAGNOSIS — Z823 Family history of stroke: Secondary | ICD-10-CM | POA: Diagnosis not present

## 2016-09-09 DIAGNOSIS — I480 Paroxysmal atrial fibrillation: Secondary | ICD-10-CM | POA: Diagnosis present

## 2016-09-09 DIAGNOSIS — Z880 Allergy status to penicillin: Secondary | ICD-10-CM | POA: Diagnosis not present

## 2016-09-09 DIAGNOSIS — Z7722 Contact with and (suspected) exposure to environmental tobacco smoke (acute) (chronic): Secondary | ICD-10-CM | POA: Insufficient documentation

## 2016-09-09 DIAGNOSIS — Z8249 Family history of ischemic heart disease and other diseases of the circulatory system: Secondary | ICD-10-CM | POA: Diagnosis not present

## 2016-09-09 DIAGNOSIS — Z7901 Long term (current) use of anticoagulants: Secondary | ICD-10-CM | POA: Diagnosis not present

## 2016-09-09 DIAGNOSIS — I10 Essential (primary) hypertension: Secondary | ICD-10-CM | POA: Diagnosis not present

## 2016-09-09 DIAGNOSIS — E113299 Type 2 diabetes mellitus with mild nonproliferative diabetic retinopathy without macular edema, unspecified eye: Secondary | ICD-10-CM | POA: Insufficient documentation

## 2016-09-09 HISTORY — PX: EP IMPLANTABLE DEVICE: SHX172B

## 2016-09-09 LAB — BASIC METABOLIC PANEL
ANION GAP: 7 (ref 5–15)
BUN: 10 mg/dL (ref 6–20)
CO2: 29 mmol/L (ref 22–32)
Calcium: 9.3 mg/dL (ref 8.9–10.3)
Chloride: 104 mmol/L (ref 101–111)
Creatinine, Ser: 0.79 mg/dL (ref 0.61–1.24)
GLUCOSE: 110 mg/dL — AB (ref 65–99)
POTASSIUM: 3.9 mmol/L (ref 3.5–5.1)
SODIUM: 140 mmol/L (ref 135–145)

## 2016-09-09 LAB — GLUCOSE, CAPILLARY: Glucose-Capillary: 106 mg/dL — ABNORMAL HIGH (ref 65–99)

## 2016-09-09 LAB — CBC
HCT: 38 % — ABNORMAL LOW (ref 39.0–52.0)
Hemoglobin: 13.1 g/dL (ref 13.0–17.0)
MCH: 30.7 pg (ref 26.0–34.0)
MCHC: 34.5 g/dL (ref 30.0–36.0)
MCV: 89 fL (ref 78.0–100.0)
PLATELETS: 268 10*3/uL (ref 150–400)
RBC: 4.27 MIL/uL (ref 4.22–5.81)
RDW: 13.3 % (ref 11.5–15.5)
WBC: 5.8 10*3/uL (ref 4.0–10.5)

## 2016-09-09 SURGERY — LOOP RECORDER INSERTION

## 2016-09-09 MED ORDER — LIDOCAINE-EPINEPHRINE 1 %-1:100000 IJ SOLN
INTRAMUSCULAR | Status: DC | PRN
  Administered 2016-09-09: 15 mL via INTRADERMAL

## 2016-09-09 MED ORDER — LIDOCAINE-EPINEPHRINE 1 %-1:100000 IJ SOLN
INTRAMUSCULAR | Status: AC
  Filled 2016-09-09: qty 1

## 2016-09-09 SURGICAL SUPPLY — 2 items
LOOP REVEAL LINQSYS (Prosthesis & Implant Heart) ×2 IMPLANT
PACK LOOP INSERTION (CUSTOM PROCEDURE TRAY) ×3 IMPLANT

## 2016-09-09 NOTE — Discharge Instructions (Signed)
OK to remove outer dressing in 24 hours OK to shower in 24 hours

## 2016-09-09 NOTE — Interval H&P Note (Signed)
History and Physical Interval Note:  09/09/2016 7:34 AM  [name]  has presented today for surgery, with the diagnosis of afib  The various methods of treatment have been discussed with the patient and family. After consideration of risks, benefits and other options for treatment, the patient has consented to  Procedure(s): Loop Recorder Insertion (N/A) as a surgical intervention .  The patient's history has been reviewed, patient examined, no change in status, stable for surgery.  I have reviewed the patient's chart and labs.  Questions were answered to the patient's satisfaction.     Hillis RangeJames Tonie Vizcarrondo

## 2016-09-09 NOTE — H&P (View-Only) (Signed)
Primary Care Physician: Sid Falcon, MD Primary Cardiologist: Dr Shirlee Latch Primary Electrophysiologist: Dr Cleotis Lema is a 60 y.o. male with a history of paroxysmal atrial fibrillation who presents for follow up in the [facility].  He recently had a syncopal episode.  Etiology is unclear though he is felt to likely have had a post termination pause with his afib.  He has done well since and remains active.  He is otherwise completely asymptomatic with his afib.  Today, he  denies symptoms of palpitations, chest pain, shortness of breath, orthopnea, PND, lower extremity edema, dizziness, presyncope, recurrent syncope, snoring, daytime somnolence, bleeding, or neurologic sequela. The patient is tolerating medications without difficulties and is otherwise without complaint today.    Atrial Fibrillation Risk Factors:  he does have symptoms or diagnosis of sleep apnea. he is compliant with CPAP therapy.  he does not have a history of rheumatic fever.  he does not have a history of alcohol use.  he has a BMI of Body mass index is 31.55 kg/m.Marland Kitchen Filed Weights   09/07/16 1335  Weight: 232 lb 9.6 oz (105.5 kg)    LA size: 47mm   Atrial Fibrillation Management history:  Previous antiarrhythmic drugs: sotalol, stopped due to ineffectiveness   Previous ablations: none  CHADS2VASC score: 2  Anticoagulation history: xarelto previously, currently on eliquis   Past Medical History:  Diagnosis Date  . Atrial fibrillation (HCC)   . Essential hypertension, benign   . 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 Obesity, unspecified   . Personal history of other specified diseases(V13.89)   . Pure hyperglyceridemia   . Type II or unspecified type diabetes mellitus without mention of complication, not stated as uncontrolled    Past Surgical History:  Procedure Laterality Date  . cornea radial keratotomy Bilateral 2005  . KNEE  ARTHROTOMY Right 1978  . NASAL SEPTOPLASTY W/ TURBINOPLASTY  1999    Current Outpatient Prescriptions  Medication Sig Dispense Refill  . amLODipine (NORVASC) 10 MG tablet TAKE 1 TABLET(10 MG) BY MOUTH DAILY 90 tablet 3  . apixaban (ELIQUIS) 5 MG TABS tablet Take 1 tablet (5 mg total) by mouth 2 (two) times daily. (Patient taking differently: Take 5 mg by mouth daily. ) 60 tablet 10  . atorvastatin (LIPITOR) 10 MG tablet TAKE 1/2 TABLET BY MOUTH EVERY DAY 45 tablet 3  . Liraglutide (VICTOZA) 18 MG/3ML SOPN Inject 1.8 mg into the skin daily.     Marland Kitchen lisinopril (PRINIVIL,ZESTRIL) 40 MG tablet Take 0.5 tablets (20 mg total) by mouth 2 (two) times daily. 90 tablet 0  . loratadine (CLARITIN) 10 MG tablet Take 10 mg by mouth daily as needed for allergies.    . metFORMIN (GLUCOPHAGE-XR) 500 MG 24 hr tablet Take 2,000 mg by mouth daily with breakfast.    . metoprolol succinate (TOPROL-XL) 50 MG 24 hr tablet Take 1 tablet (50 mg total) by mouth at bedtime. Take with or immediately following a meal. (Patient taking differently: Take 50 mg by mouth 2 (two) times daily. Take with or immediately following a meal.) 180 tablet 11  . Omega-3 Fatty Acids (FISH OIL) 1000 MG CAPS Take 1,000 mg by mouth daily.    . ONE TOUCH ULTRA TEST test strip CHECK BLOOD SUGAR ONCE D  5  . sildenafil (REVATIO) 20 MG tablet Take 80 mg by mouth as needed (ED).     Marland Kitchen ibuprofen (ADVIL,MOTRIN) 200 MG tablet Take 200 mg by mouth  every 6 (six) hours as needed.    Marland Kitchen. ibuprofen (ADVIL,MOTRIN) 600 MG tablet Take 600 mg by mouth every 6 (six) hours as needed for moderate pain.    . potassium chloride SA (K-DUR,KLOR-CON) 20 MEQ tablet TAKE 1 TABLET(20 MEQ) BY MOUTH DAILY (Patient not taking: Reported on 09/07/2016) 30 tablet 3   No current facility-administered medications for this encounter.     Allergies  Allergen Reactions  . Penicillins Hives    Has patient had a PCN reaction causing immediate rash, facial/tongue/throat swelling, SOB  or lightheadedness with hypotension: Yes Has patient had a PCN reaction causing severe rash involving mucus membranes or skin necrosis: No Has patient had a PCN reaction that required hospitalization No Has patient had a PCN reaction occurring within the last 10 years: No If all of the above answers are "NO", then may proceed with Cephalosporin use.     Social History   Social History  . Marital status: Married    Spouse name: N/A  . Number of children: N/A  . Years of education: N/A   Occupational History  . Not on file.   Social History Main Topics  . Smoking status: Passive Smoke Exposure - Never Smoker  . Smokeless tobacco: Not on file  . Alcohol use Yes     Comment: seldom  . Drug use: No  . Sexual activity: Not on file   Other Topics Concern  . Not on file   Social History Narrative  . No narrative on file    Family History  Problem Relation Age of Onset  . Diabetes    . Hypertension Father   . Other Father     pat/mat uncles, malignant neoplasm of the prostate gland  . Stroke Father   . Diabetes Mother   . Heart disease Maternal Aunt     ROS- All systems are reviewed and negative except as per the HPI above.  Physical Exam: Vitals:   09/07/16 1335  BP: (!) 144/88  Pulse: 78  Weight: 232 lb 9.6 oz (105.5 kg)  Height: 6' (1.829 m)    GEN- The patient is well appearing, alert and oriented x 3 today.   Head- normocephalic, atraumatic Eyes-  Sclera clear, conjunctiva pink Ears- hearing intact Oropharynx- clear Neck- supple  Lungs- Clear to ausculation bilaterally, normal work of breathing Heart- Regular rate and rhythm, no murmurs, rubs or gallops  GI- soft, NT, ND, + BS Extremities- no clubbing, cyanosis, or edema MS- no significant deformity or atrophy Skin- no rash or lesion Psych- euthymic mood, full affect Neuro- strength and sensation are intact  Wt Readings from Last 3 Encounters:  09/07/16 232 lb 9.6 oz (105.5 kg)  08/23/16 231 lb 3.2  oz (104.9 kg)  06/16/16 236 lb 9.6 oz (107.3 kg)    EKG today demonstrates sinus rhythm, normal ekg, QTc 446 msec Echo today demonstrated EF 55%, moderate LA enlargement  Epic records are reviewed at length today  Assessment and Plan:  1. Paroxysmal atrial fibrillation The patient is asymptomatic with his afib Rate control seems to be a good strategy for him Continue eliquis for CHADS2VASC of 2  2. syncope Unclear etiology ekg and echo are low risk Post termination pause with afib is suspected. I have advised implantable loop recorder for further evaluation.  Holter 6/17 revealed AF with RVR but no other arrhythmias.  He is willing to proceed with ILR placement.  Risks of procedure discussed with the patient who wishes to proceed. I have  been very clear that he should not drive for 6 months.  3. HTN Stable No change required today  4. Obstructive sleep apnea The importance of adequate treatment of sleep apnea was discussed today in order to improve our ability to maintain sinus rhythm long term. The patient reports compliance with CPAP   Follow-up with Lupita Leash 3 months s/p ILR placement.  Hillis Range, MD 09/07/2016 5:39 PM

## 2016-09-21 ENCOUNTER — Encounter: Payer: Self-pay | Admitting: Internal Medicine

## 2016-09-21 ENCOUNTER — Ambulatory Visit (INDEPENDENT_AMBULATORY_CARE_PROVIDER_SITE_OTHER): Payer: 59 | Admitting: *Deleted

## 2016-09-21 DIAGNOSIS — R55 Syncope and collapse: Secondary | ICD-10-CM

## 2016-09-21 NOTE — Progress Notes (Signed)
Wound check appointment. Steri-strips removed. Wound without redness or edema. Incision edges approximated, wound well healed. Loop check in clinic. Battery status: good. R-waves 0.4668mV. 0 symptom episodes, 26 tachy episodes, 0 pause episodes, 0 brady episodes. 1 AF episodes (44.1% burden) + Eliquis. Monthly summary reports and ROV with JA 11/27. Patient education complete regarding home monitoring and wound care.

## 2016-09-22 LAB — CUP PACEART INCLINIC DEVICE CHECK: MDC IDC SESS DTM: 20171025180946

## 2016-09-22 NOTE — Patient Instructions (Signed)
Wound check appointment. Steri-strips removed. Wound without redness or edema. Incision edges approximated, wound well healed. Loop check in clinic. Battery status: good. R-waves 0.68mV. 0 symptom episodes, 26 tachy episodes, 0 pause episodes, 0 brady episodes. 1 AF episodes (44.1% burden) + Eliquis. Monthly summary reports and ROV with JA 11/27. Patient education complete regarding home monitoring and wound care. 

## 2016-10-10 ENCOUNTER — Ambulatory Visit (INDEPENDENT_AMBULATORY_CARE_PROVIDER_SITE_OTHER): Payer: 59 | Admitting: *Deleted

## 2016-10-10 DIAGNOSIS — R55 Syncope and collapse: Secondary | ICD-10-CM | POA: Diagnosis not present

## 2016-10-10 NOTE — Progress Notes (Signed)
Carelink Summary Report / Loop Recorder 

## 2016-10-24 ENCOUNTER — Encounter: Payer: Self-pay | Admitting: Internal Medicine

## 2016-10-24 ENCOUNTER — Ambulatory Visit (INDEPENDENT_AMBULATORY_CARE_PROVIDER_SITE_OTHER): Payer: 59 | Admitting: Internal Medicine

## 2016-10-24 VITALS — BP 118/98 | HR 74 | Ht 72.0 in | Wt 231.6 lb

## 2016-10-24 DIAGNOSIS — I10 Essential (primary) hypertension: Secondary | ICD-10-CM | POA: Diagnosis not present

## 2016-10-24 DIAGNOSIS — G4733 Obstructive sleep apnea (adult) (pediatric): Secondary | ICD-10-CM | POA: Diagnosis not present

## 2016-10-24 DIAGNOSIS — I480 Paroxysmal atrial fibrillation: Secondary | ICD-10-CM | POA: Diagnosis not present

## 2016-10-24 LAB — CUP PACEART INCLINIC DEVICE CHECK
Date Time Interrogation Session: 20171127152012
Implantable Pulse Generator Implant Date: 20171013

## 2016-10-24 MED ORDER — METOPROLOL SUCCINATE ER 100 MG PO TB24: 100.0000 mg | ORAL_TABLET | Freq: Every day | ORAL | 3 refills | Status: DC

## 2016-10-24 NOTE — Progress Notes (Signed)
Primary Care Physician: Sid FalconKALISH, MICHAEL J, MD Primary Cardiologist: Dr Shirlee LatchMcLean Primary Electrophysiologist: Dr Cleotis LemaAllred  Cope R Banh is a 60 y.o. male with a history of paroxysmal atrial fibrillation who presents for follow up.  He has had no further syncope.  He did recently trip in a parking deck and fall and fracture his R hand.  ILR reveals AF burden of 39% for which he is completely asymptomatic.  Energy is good.  Today, he  denies symptoms of palpitations, chest pain, shortness of breath, orthopnea, PND, lower extremity edema, dizziness, presyncope, recurrent syncope, snoring, daytime somnolence, bleeding, or neurologic sequela. The patient is tolerating medications without difficulties and is otherwise without complaint today.    Atrial Fibrillation Risk Factors:  he does have symptoms or diagnosis of sleep apnea. he is compliant with CPAP therapy.  he does not have a history of rheumatic fever.  he does not have a history of alcohol use.  he has a BMI of Body mass index is 31.41 kg/m.Marland Kitchen. Filed Weights   10/24/16 0918  Weight: 231 lb 9.6 oz (105.1 kg)    LA size: 47mm   Atrial Fibrillation Management history:  Previous antiarrhythmic drugs: sotalol, stopped due to ineffectiveness   Previous ablations: none  CHADS2VASC score: 2  Anticoagulation history: xarelto previously, currently on eliquis   Past Medical History:  Diagnosis Date  . Atrial fibrillation (HCC)   . Essential hypertension, benign   . 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 Obesity, unspecified   . Personal history of other specified diseases(V13.89)   . Pure hyperglyceridemia   . Type II or unspecified type diabetes mellitus without mention of complication, not stated as uncontrolled    Past Surgical History:  Procedure Laterality Date  . cornea radial keratotomy Bilateral 2005  . EP IMPLANTABLE DEVICE N/A 09/09/2016   Procedure: Loop Recorder Insertion;  Surgeon: Hillis RangeJames  Riki Berninger, MD;  Location: [facility];  Service: Cardiovascular;  Laterality: N/A;  . KNEE ARTHROTOMY Right 1978  . NASAL SEPTOPLASTY W/ TURBINOPLASTY  1999    Current Outpatient Prescriptions  Medication Sig Dispense Refill  . amLODipine (NORVASC) 10 MG tablet TAKE 1 TABLET(10 MG) BY MOUTH DAILY 90 tablet 3  . apixaban (ELIQUIS) 5 MG TABS tablet Take 1 tablet (5 mg total) by mouth 2 (two) times daily. 60 tablet 10  . atorvastatin (LIPITOR) 10 MG tablet TAKE 1/2 TABLET BY MOUTH EVERY DAY 45 tablet 3  . ibuprofen (ADVIL,MOTRIN) 200 MG tablet Take 200 mg by mouth every 6 (six) hours as needed (pain).     Marland Kitchen. ibuprofen (ADVIL,MOTRIN) 600 MG tablet Take 600 mg by mouth every 6 (six) hours as needed for moderate pain.    . Liraglutide (VICTOZA) 18 MG/3ML SOPN Inject 1.8 mg into the skin daily.     Marland Kitchen. lisinopril (PRINIVIL,ZESTRIL) 40 MG tablet Take 0.5 tablets (20 mg total) by mouth 2 (two) times daily. 90 tablet 0  . loratadine (CLARITIN) 10 MG tablet Take 10 mg by mouth daily as needed for allergies.    . metFORMIN (GLUCOPHAGE-XR) 500 MG 24 hr tablet Take 2,000 mg by mouth daily with breakfast.    . metoprolol succinate (TOPROL-XL) 50 MG 24 hr tablet Take 1 tablet (50 mg total) by mouth at bedtime. Take with or immediately following a meal. 180 tablet 11  . Omega-3 Fatty Acids (FISH OIL) 1000 MG CAPS Take 1,000 mg by mouth daily.    . ONE TOUCH ULTRA TEST test strip CHECK BLOOD SUGAR  ONCE D  5  . potassium chloride SA (K-DUR,KLOR-CON) 20 MEQ tablet TAKE 1 TABLET(20 MEQ) BY MOUTH DAILY 30 tablet 3  . sildenafil (REVATIO) 20 MG tablet Take 80 mg by mouth as needed (ED). Take as directed     No current facility-administered medications for this visit.     Allergies  Allergen Reactions  . Penicillins Hives    Has patient had a PCN reaction causing immediate rash, facial/tongue/throat swelling, SOB or lightheadedness with hypotension: Yes Has patient had a PCN reaction causing severe rash  involving mucus membranes or skin necrosis: No Has patient had a PCN reaction that required hospitalization No Has patient had a PCN reaction occurring within the last 10 years: No If all of the above answers are "NO", then may proceed with Cephalosporin use.     Social History   Social History  . Marital status: Married    Spouse name: N/A  . Number of children: N/A  . Years of education: N/A   Occupational History  . Not on file.   Social History Main Topics  . Smoking status: Passive Smoke Exposure - Never Smoker  . Smokeless tobacco: Not on file  . Alcohol use Yes     Comment: seldom  . Drug use: No  . Sexual activity: Not on file   Other Topics Concern  . Not on file   Social History Narrative  . No narrative on file    Family History  Problem Relation Age of Onset  . Hypertension Father   . Other Father     pat/mat uncles, malignant neoplasm of the prostate gland  . Stroke Father   . Diabetes Mother   . Heart disease Maternal Aunt   . Diabetes      ROS- All systems are reviewed and negative except as per the HPI above.  Physical Exam: Vitals:   10/24/16 0918  BP: (!) 118/98  Pulse: 74  Weight: 231 lb 9.6 oz (105.1 kg)  Height: 6' (1.829 m)    GEN- The patient is well appearing, alert and oriented x 3 today.   Head- normocephalic, atraumatic Eyes-  Sclera clear, conjunctiva pink Ears- hearing intact Oropharynx- clear Neck- supple  Lungs- Clear to ausculation bilaterally, normal work of breathing Heart- Regular rate and rhythm, no murmurs, rubs or gallops  GI- soft, NT, ND, + BS Extremities- no clubbing, cyanosis, or edema MS- no significant deformity or atrophy Skin- no rash or lesion Psych- euthymic mood, full affect Neuro- strength and sensation are intact  Wt Readings from Last 3 Encounters:  10/24/16 231 lb 9.6 oz (105.1 kg)  09/09/16 232 lb (105.2 kg)  09/07/16 232 lb 9.6 oz (105.5 kg)   ILR site today reveals 39% afib with elevated  V rates during afib  Assessment and Plan:  1. Paroxysmal atrial fibrillation The patient is asymptomatic with his afib Rate control need to be improved per ILR review.  I will therefore increase toprol to 100mg  daily today Continue eliquis for CHADS2VASC of 2  2. syncope Unclear etiology ekg and echo are low risk Post termination pause with afib is suspected. ILR is reviewed and reveals no reason for concern.  He has had no further syncope.  Will continue to follow ILR  3. HTN Stable No change required today  4. Obstructive sleep apnea The patient reports compliance with CPAP   Follow-up with Lupita LeashDonna in 4 weeks for follow-up on rate control by ILR. Return to see me in 10 weeks  Hillis Range, MD 10/24/2016 9:48 AM

## 2016-10-24 NOTE — Patient Instructions (Signed)
Medication Instructions:  Your physician has recommended you make the following change in your medication:  1) Increase Metoprolol to 100 mg daily    Labwork: None ordered   Testing/Procedures: None ordered   Follow-Up: Your physician recommends that you schedule a follow-up appointment in: 4 weeks in the afib clinic and 10 weeks with Dr Johney FrameAllred   Any Other Special Instructions Will Be Listed Below (If Applicable).     If you need a refill on your cardiac medications before your next appointment, please call your pharmacy.

## 2016-11-08 ENCOUNTER — Ambulatory Visit (INDEPENDENT_AMBULATORY_CARE_PROVIDER_SITE_OTHER): Payer: 59 | Admitting: *Deleted

## 2016-11-08 DIAGNOSIS — R55 Syncope and collapse: Secondary | ICD-10-CM

## 2016-11-08 NOTE — Progress Notes (Signed)
Carelink Summary Report / Loop Recorder 

## 2016-11-17 ENCOUNTER — Ambulatory Visit (HOSPITAL_COMMUNITY)
Admission: RE | Admit: 2016-11-17 | Discharge: 2016-11-17 | Disposition: A | Discharge status: Home / Self Care | Payer: 59 | Source: Ambulatory Visit | Attending: Nurse Practitioner | Admitting: Nurse Practitioner

## 2016-11-17 ENCOUNTER — Encounter (HOSPITAL_COMMUNITY): Payer: Self-pay | Admitting: Nurse Practitioner

## 2016-11-17 ENCOUNTER — Encounter: Payer: Self-pay | Admitting: Internal Medicine

## 2016-11-17 VITALS — BP 140/84 | HR 125 | Ht 72.0 in | Wt 234.8 lb

## 2016-11-17 DIAGNOSIS — Z7901 Long term (current) use of anticoagulants: Secondary | ICD-10-CM | POA: Diagnosis not present

## 2016-11-17 DIAGNOSIS — I480 Paroxysmal atrial fibrillation: Secondary | ICD-10-CM | POA: Diagnosis not present

## 2016-11-17 DIAGNOSIS — E669 Obesity, unspecified: Secondary | ICD-10-CM | POA: Insufficient documentation

## 2016-11-17 DIAGNOSIS — Z6831 Body mass index (BMI) 31.0-31.9, adult: Secondary | ICD-10-CM | POA: Diagnosis not present

## 2016-11-17 DIAGNOSIS — Z823 Family history of stroke: Secondary | ICD-10-CM | POA: Diagnosis not present

## 2016-11-17 DIAGNOSIS — R55 Syncope and collapse: Secondary | ICD-10-CM | POA: Insufficient documentation

## 2016-11-17 DIAGNOSIS — E781 Pure hyperglyceridemia: Secondary | ICD-10-CM | POA: Insufficient documentation

## 2016-11-17 DIAGNOSIS — Z833 Family history of diabetes mellitus: Secondary | ICD-10-CM | POA: Diagnosis not present

## 2016-11-17 DIAGNOSIS — Z794 Long term (current) use of insulin: Secondary | ICD-10-CM | POA: Insufficient documentation

## 2016-11-17 DIAGNOSIS — E113299 Type 2 diabetes mellitus with mild nonproliferative diabetic retinopathy without macular edema, unspecified eye: Secondary | ICD-10-CM | POA: Insufficient documentation

## 2016-11-17 DIAGNOSIS — Z8249 Family history of ischemic heart disease and other diseases of the circulatory system: Secondary | ICD-10-CM | POA: Diagnosis not present

## 2016-11-17 DIAGNOSIS — Z880 Allergy status to penicillin: Secondary | ICD-10-CM | POA: Insufficient documentation

## 2016-11-17 DIAGNOSIS — Z79899 Other long term (current) drug therapy: Secondary | ICD-10-CM | POA: Insufficient documentation

## 2016-11-17 DIAGNOSIS — I10 Essential (primary) hypertension: Secondary | ICD-10-CM | POA: Insufficient documentation

## 2016-11-17 MED ORDER — METOPROLOL SUCCINATE ER 100 MG PO TB24: ORAL_TABLET | ORAL | 3 refills | Status: DC

## 2016-11-17 NOTE — Progress Notes (Signed)
Patient ID: George Carr, male   DOB: [birth year], 60 y.o.   MRN: [id number]      Primary Care Physician: Sid FalconKALISH, MICHAEL J, MD Referring Physician:  Dr Lawerance BachMcLean   George Carr is a 60 y.o. male with a h/o 60 yo with history of type II diabetes, HTN, and paroxysmal atrial fibrillation. Atrial fibrillation was first noted in 2/15 when he went for a colonoscopy. He was unaware of it, no symptoms. He was started on sotalol and Xarelto. He went back into NSR .   Seen 10/ 2015 by Dr. Johney FrameAllred,  episode of presyncope, when he was walking around in the am, after eating breakfast, got dizzy and fell to the floor, did not have LOC. He went to the doctor and ekg showed afib at 108 bpm. He is unaware of heart beat and does not feel palpitations. He wore a monitor wich showed some PAF.  Sotalol was increased to 120 mg bid, by Dr. Johney FrameAllred and he returned to afib clinic, 11/15,   feeling well, not aware of any irregular heartbeat or further dizziness. Had a physical with his MD this week and labs were normal per patient. (NOT IN EPIC). Stated his MD was very pleased with lab results. EKG does reveal SR. Wearing Cpap consistently.   Afib clinic f/u 06/16/16. He was seen by Dr. Shirlee LatchMcLean 05/08/16, and found to be in afib. Sotalol was continued but 48 hour monitor was placed. He was in afib for the majority of the time. Pt felt well and was not aware that he was in afib. Sotalol was stopped and toprol was added. Dr. Shirlee LatchMcLean wanted him seen  In afib clinic, 7/17,  to make sure he was rate controlled, but by surprise he was in SR, He can't tell any difference when he  is in SR vrs afib. Tolerating BB well. Continues on apixaban for chadsvasc score of at least 2.  Urgently seen in afib clinic, 9/26, for syncopal episode this am. Had been in his usual state of health. Went to a early meeting at work and was standing outside talking to a couple of co-workers when he felt lightheaded and next thing he was on the ground. His  co- workers remarked as soon as he hit the ground, his eyes were open and he was immediately mentally appropriate. He was not aware of any change in his heart rate. He was driven home and made an appointment for the clinic. Ekg showed afib with v rate at 102 bpm. Standing BP in office 130/80. He is not aware of irregular heart beat. Feels back to baseline. He does take all of BP pills in the am. Denies any diarrhea, heavy sweating or other reason to be dehydrated. Just had physical with his PCP with labs 3 weeks ago and was told everything was good.  F/u afib clinc 12/21. He did receive a Linq from Dr. Johney FrameAllred and has not had any further episodes of syncope but did have  A afib burden at 39% when device interrogated 11/27. His BB was increased and asked to f/u here for further interrogation and determination of afib burden. Today burden at 47% and he is in afib with rvr and is asymptomatic.  Today, he denies symptoms of palpitations, chest pain, shortness of breath, orthopnea, PND, lower extremity edema. Posiitive for dizziness presyncope, syncope.. The patient is tolerating medications without difficulties and is otherwise without complaint today.   Past Medical History:  Diagnosis Date  . Atrial fibrillation (HCC)   .  Essential hypertension, benign   . 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 Obesity, unspecified   . Personal history of other specified diseases(V13.89)   . Pure hyperglyceridemia   . Type II or unspecified type diabetes mellitus without mention of complication, not stated as uncontrolled    Past Surgical History:  Procedure Laterality Date  . cornea radial keratotomy Bilateral 2005  . EP IMPLANTABLE DEVICE N/A 09/09/2016   Procedure: Loop Recorder Insertion;  Surgeon: Hillis RangeJames Allred, MD;  Location: [facility];  Service: Cardiovascular;  Laterality: N/A;  . KNEE ARTHROTOMY Right 1978  . NASAL SEPTOPLASTY W/ TURBINOPLASTY  1999    Current Outpatient  Prescriptions  Medication Sig Dispense Refill  . amLODipine (NORVASC) 10 MG tablet TAKE 1 TABLET(10 MG) BY MOUTH DAILY 90 tablet 3  . apixaban (ELIQUIS) 5 MG TABS tablet Take 1 tablet (5 mg total) by mouth 2 (two) times daily. 60 tablet 10  . atorvastatin (LIPITOR) 10 MG tablet TAKE 1/2 TABLET BY MOUTH EVERY DAY 45 tablet 3  . ibuprofen (ADVIL,MOTRIN) 200 MG tablet Take 200 mg by mouth every 6 (six) hours as needed (pain).     . Liraglutide (VICTOZA) 18 MG/3ML SOPN Inject 1.8 mg into the skin daily.     Marland Kitchen. lisinopril (PRINIVIL,ZESTRIL) 40 MG tablet Take 0.5 tablets (20 mg total) by mouth 2 (two) times daily. 90 tablet 0  . loratadine (CLARITIN) 10 MG tablet Take 10 mg by mouth daily as needed for allergies.    . metFORMIN (GLUCOPHAGE-XR) 500 MG 24 hr tablet Take 2,000 mg by mouth daily with breakfast.    . metoprolol succinate (TOPROL-XL) 100 MG 24 hr tablet Take 100mg  by mouth  in the morning and 50mg  (1/2 tablet) in the evening. 135 tablet 3  . Omega-3 Fatty Acids (FISH OIL) 1000 MG CAPS Take 1,000 mg by mouth daily.    . ONE TOUCH ULTRA TEST test strip CHECK BLOOD SUGAR ONCE D  5  . potassium chloride SA (K-DUR,KLOR-CON) 20 MEQ tablet TAKE 1 TABLET(20 MEQ) BY MOUTH DAILY 30 tablet 3  . sildenafil (REVATIO) 20 MG tablet Take 80 mg by mouth as needed (ED). Take as directed    . ibuprofen (ADVIL,MOTRIN) 600 MG tablet Take 600 mg by mouth every 6 (six) hours as needed for moderate pain.     No current facility-administered medications for this encounter.     Allergies  Allergen Reactions  . Penicillins Hives    Has patient had a PCN reaction causing immediate rash, facial/tongue/throat swelling, SOB or lightheadedness with hypotension: Yes Has patient had a PCN reaction causing severe rash involving mucus membranes or skin necrosis: No Has patient had a PCN reaction that required hospitalization No Has patient had a PCN reaction occurring within the last 10 years: No If all of the above  answers are "NO", then may proceed with Cephalosporin use.     Social History   Social History  . Marital status: Married    Spouse name: N/A  . Number of children: N/A  . Years of education: N/A   Occupational History  . Not on file.   Social History Main Topics  . Smoking status: Passive Smoke Exposure - Never Smoker  . Smokeless tobacco: Not on file  . Alcohol use Yes     Comment: seldom  . Drug use: No  . Sexual activity: Not on file   Other Topics Concern  . Not on file   Social History Narrative  . No  narrative on file    Family History  Problem Relation Age of Onset  . Hypertension Father   . Other Father     pat/mat uncles, malignant neoplasm of the prostate gland  . Stroke Father   . Diabetes Mother   . Heart disease Maternal Aunt   . Diabetes      ROS-as per HPI above  Physical Exam: Vitals:   11/17/16 1021  BP: 140/84  BP Location: Left Arm  Patient Position: Sitting  Cuff Size: Normal  Pulse: (!) 125  Weight: 234 lb 12.8 oz (106.5 kg)  Height: 6' (1.829 m)    GEN- The patient is well appearing, alert and oriented x 3 today.   Head- normocephalic, atraumatic Eyes-  Sclera clear, conjunctiva pink Ears- hearing intact Oropharynx- clear Neck- supple, no JVP Lymph- no cervical lymphadenopathy Lungs- Clear to ausculation bilaterally, normal work of breathing Heart- irregular rate and rhythm, no murmurs, rubs or gallops, PMI not laterally displaced GI- soft, NT, ND, + BS Extremities- no clubbing, cyanosis, or edema MS- no significant deformity or atrophy Skin- no rash or lesion Psych- euthymic mood, full affect Neuro- strength and sensation are intact  EKG- Afib at 125 bpm, qrs int 78 ms, qtc 490 ms Epic records reviewed  Assessment and Plan:  1. Paroxysmal asymptomatic afib Burden is increased despite increase in BB Will increase toprol again to 100 mg am and 50 mg pm   2.HTN Stable  3. Syncope No further episodes Continue  monitoring thru LinQ F/u with Dr. Johney Frame as scheduled 2/7   afib clinic as needed   Lupita Leash C. [facility] [address] [place], [place] [address] [phone]

## 2016-11-17 NOTE — Patient Instructions (Signed)
Your physician has recommended you make the following change in your medication:  1)Increase metoprolol to 100mg in the morning and 50mg in the evening.  

## 2016-11-23 LAB — CUP PACEART REMOTE DEVICE CHECK
Implantable Pulse Generator Implant Date: 20171013
MDC IDC SESS DTM: 20171112123529

## 2016-12-08 ENCOUNTER — Ambulatory Visit (INDEPENDENT_AMBULATORY_CARE_PROVIDER_SITE_OTHER): Payer: 59 | Admitting: *Deleted

## 2016-12-08 DIAGNOSIS — R55 Syncope and collapse: Secondary | ICD-10-CM | POA: Diagnosis not present

## 2016-12-08 NOTE — Progress Notes (Signed)
Carelink Summary Report / Loop Recorder 

## 2016-12-29 LAB — CUP PACEART REMOTE DEVICE CHECK
Implantable Pulse Generator Implant Date: 20171013
MDC IDC SESS DTM: 20171212123903

## 2016-12-29 NOTE — Progress Notes (Signed)
Carelink summary report received. Battery status OK. Normal device function. No new symptom episodes, brady, or pause episodes. 5 AF 46.2% +Eliquis. 31 tachy- avail. ECGs appear: some ?SVT/PAT, others appear AF w/ RVR. See ECGs. Histograms elevated despite increase in Toprol late November. Monthly summary reports and ROV/PRN

## 2017-01-04 ENCOUNTER — Ambulatory Visit (INDEPENDENT_AMBULATORY_CARE_PROVIDER_SITE_OTHER): Payer: Managed Care, Other (non HMO) | Admitting: Internal Medicine

## 2017-01-04 ENCOUNTER — Encounter: Payer: Self-pay | Admitting: Internal Medicine

## 2017-01-04 ENCOUNTER — Encounter (INDEPENDENT_AMBULATORY_CARE_PROVIDER_SITE_OTHER): Payer: Self-pay

## 2017-01-04 VITALS — BP 124/74 | HR 74 | Ht 72.0 in | Wt 234.8 lb

## 2017-01-04 DIAGNOSIS — G4733 Obstructive sleep apnea (adult) (pediatric): Secondary | ICD-10-CM | POA: Diagnosis not present

## 2017-01-04 DIAGNOSIS — I480 Paroxysmal atrial fibrillation: Secondary | ICD-10-CM | POA: Diagnosis not present

## 2017-01-04 DIAGNOSIS — R55 Syncope and collapse: Secondary | ICD-10-CM | POA: Diagnosis not present

## 2017-01-04 DIAGNOSIS — I10 Essential (primary) hypertension: Secondary | ICD-10-CM

## 2017-01-04 MED ORDER — METOPROLOL SUCCINATE ER 100 MG PO TB24: 100.0000 mg | ORAL_TABLET | Freq: Two times a day (BID) | ORAL | 3 refills | Status: DC

## 2017-01-04 NOTE — Progress Notes (Signed)
Primary Care Physician: Sid Falcon, MD Primary Cardiologist: Dr Shirlee Latch Primary Electrophysiologist: Dr Cleotis Lema is a 61 y.o. male with a history of paroxysmal atrial fibrillation who presents for follow up.  He has had no further syncope.  ILR reveals AF burden of 39-44% for which he is completely asymptomatic.  Energy is good.  Today, he  denies symptoms of palpitations, chest pain, shortness of breath, orthopnea, PND, lower extremity edema, dizziness, presyncope, recurrent syncope, snoring, daytime somnolence, bleeding, or neurologic sequela. The patient is tolerating medications without difficulties and is otherwise without complaint today.    Atrial Fibrillation Risk Factors:  he does have symptoms or diagnosis of sleep apnea. he is compliant with CPAP therapy.  he does not have a history of rheumatic fever.  he does not have a history of alcohol use.  he has a BMI of Body mass index is 31.84 kg/m.Marland Kitchen Filed Weights   01/04/17 0934  Weight: 234 lb 12.8 oz (106.5 kg)    LA size: 47mm   Atrial Fibrillation Management history:  Previous antiarrhythmic drugs: sotalol, stopped due to ineffectiveness   Previous ablations: none  CHADS2VASC score: 2  Anticoagulation history: xarelto previously, currently on eliquis   Past Medical History:  Diagnosis Date  . Atrial fibrillation (HCC)   . Essential hypertension, benign   . 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 Obesity, unspecified   . Personal history of other specified diseases(V13.89)   . Pure hyperglyceridemia   . Type II or unspecified type diabetes mellitus without mention of complication, not stated as uncontrolled    Past Surgical History:  Procedure Laterality Date  . cornea radial keratotomy Bilateral 2005  . EP IMPLANTABLE DEVICE N/A 09/09/2016   Procedure: Loop Recorder Insertion;  Surgeon: Hillis Range, MD;  Location: [facility];  Service: Cardiovascular;   Laterality: N/A;  . KNEE ARTHROTOMY Right 1978  . NASAL SEPTOPLASTY W/ TURBINOPLASTY  1999    Current Outpatient Prescriptions  Medication Sig Dispense Refill  . amLODipine (NORVASC) 10 MG tablet TAKE 1 TABLET(10 MG) BY MOUTH DAILY 90 tablet 3  . apixaban (ELIQUIS) 5 MG TABS tablet Take 1 tablet (5 mg total) by mouth 2 (two) times daily. 60 tablet 10  . atorvastatin (LIPITOR) 10 MG tablet TAKE 1/2 TABLET BY MOUTH EVERY DAY 45 tablet 3  . ibuprofen (ADVIL,MOTRIN) 200 MG tablet Take 200 mg by mouth every 6 (six) hours as needed (pain).     Marland Kitchen ibuprofen (ADVIL,MOTRIN) 600 MG tablet Take 600 mg by mouth every 6 (six) hours as needed for moderate pain.    . Liraglutide (VICTOZA) 18 MG/3ML SOPN Inject 1.8 mg into the skin daily.     Marland Kitchen lisinopril (PRINIVIL,ZESTRIL) 40 MG tablet Take 0.5 tablets (20 mg total) by mouth 2 (two) times daily. 90 tablet 0  . loratadine (CLARITIN) 10 MG tablet Take 10 mg by mouth daily as needed for allergies.    . metFORMIN (GLUCOPHAGE-XR) 500 MG 24 hr tablet Take 2,000 mg by mouth daily with breakfast.    . metoprolol succinate (TOPROL-XL) 100 MG 24 hr tablet Take 1 tablet by mouth every morning.  3  . metoprolol succinate (TOPROL-XL) 50 MG 24 hr tablet Take 1 tablet by mouth every evening. (Takes one 100 mg tablet in the sm)  11  . Omega-3 Fatty Acids (FISH OIL) 1000 MG CAPS Take 1,000 mg by mouth daily.    . ONE TOUCH ULTRA TEST test strip CHECK BLOOD SUGAR  ONCE D  5  . potassium chloride SA (K-DUR,KLOR-CON) 20 MEQ tablet TAKE 1 TABLET(20 MEQ) BY MOUTH DAILY 30 tablet 3  . sildenafil (REVATIO) 20 MG tablet Take 80 mg by mouth as needed (ED). Take as directed     No current facility-administered medications for this visit.     Allergies  Allergen Reactions  . Penicillins Hives    Has patient had a PCN reaction causing immediate rash, facial/tongue/throat swelling, SOB or lightheadedness with hypotension: Yes Has patient had a PCN reaction causing severe rash  involving mucus membranes or skin necrosis: No Has patient had a PCN reaction that required hospitalization No Has patient had a PCN reaction occurring within the last 10 years: No If all of the above answers are "NO", then may proceed with Cephalosporin use.     Social History   Social History  . Marital status: Married    Spouse name: N/A  . Number of children: N/A  . Years of education: N/A   Occupational History  . Not on file.   Social History Main Topics  . Smoking status: Passive Smoke Exposure - Never Smoker  . Smokeless tobacco: Never Used  . Alcohol use Yes     Comment: seldom  . Drug use: No  . Sexual activity: Not on file   Other Topics Concern  . Not on file   Social History Narrative  . No narrative on file    Family History  Problem Relation Age of Onset  . Hypertension Father   . Other Father     pat/mat uncles, malignant neoplasm of the prostate gland  . Stroke Father   . Diabetes Mother   . Heart disease Maternal Aunt   . Diabetes      ROS- All systems are reviewed and negative except as per the HPI above.  Physical Exam: Vitals:   01/04/17 0934  BP: 124/74  Pulse: 74  Weight: 234 lb 12.8 oz (106.5 kg)  Height: 6' (1.829 m)    GEN- The patient is well appearing, alert and oriented x 3 today.   Head- normocephalic, atraumatic Eyes-  Sclera clear, conjunctiva pink Ears- hearing intact Oropharynx- clear Neck- supple  Lungs- Clear to ausculation bilaterally, normal work of breathing Heart- Regular rate and rhythm, no murmurs, rubs or gallops  GI- soft, NT, ND, + BS Extremities- no clubbing, cyanosis, or edema MS- no significant deformity or atrophy Skin- no rash or lesion Psych- euthymic mood, full affect Neuro- strength and sensation are intact  Wt Readings from Last 3 Encounters:  01/04/17 234 lb 12.8 oz (106.5 kg)  11/17/16 234 lb 12.8 oz (106.5 kg)  10/24/16 231 lb 9.6 oz (105.1 kg)   ILR site today reveals 44% (previously  39%) afib with elevated V rates during afib  Assessment and Plan:  1. Paroxysmal atrial fibrillation The patient is asymptomatic with his afib Rate control need to be improved per ILR review.  I will therefore increase toprol to 100mg  BID today Continue eliquis for CHADS2VASC of 2 Repeat echo upon return to make sure that EF does not decline with AF and RVR.  2. syncope Unclear etiology ekg and echo are low risk Post termination pause with afib is suspected. ILR is reviewed and reveals no reason for concern.  He has had no further syncope.  Will continue to follow ILR  3. HTN Stable No change required today  4. Obstructive sleep apnea The patient reports compliance with CPAP    Return to  see me in 3 months  Hillis RangeJames Mansoor Hillyard, MD 01/04/2017 9:59 AM

## 2017-01-04 NOTE — Patient Instructions (Signed)
Medication Instructions:  Your physician has recommended you make the following change in your medication:  1) Increase Metoprolol to 100 mg twice daily   Labwork: None ordered   Testing/Procedures: Your physician has requested that you have an echocardiogram. Echocardiography is a painless test that uses sound waves to create images of your heart. It provides your doctor with information about the size and shape of your heart and how well your heart's chambers and valves are working. This procedure takes approximately one hour. There are no restrictions for this procedure.    Follow-Up: Your physician recommends that you schedule a follow-up appointment in: 3 months with Dr Johney FrameAllred   Any Other Special Instructions Will Be Listed Below (If Applicable).     If you need a refill on your cardiac medications before your next appointment, please call your pharmacy.

## 2017-01-05 LAB — CUP PACEART INCLINIC DEVICE CHECK
Date Time Interrogation Session: 20180207143716
MDC IDC PG IMPLANT DT: 20171013

## 2017-01-09 ENCOUNTER — Ambulatory Visit (INDEPENDENT_AMBULATORY_CARE_PROVIDER_SITE_OTHER): Payer: Managed Care, Other (non HMO) | Admitting: *Deleted

## 2017-01-09 DIAGNOSIS — R55 Syncope and collapse: Secondary | ICD-10-CM | POA: Diagnosis not present

## 2017-01-09 NOTE — Progress Notes (Signed)
Carelink Summary Report / Loop Recorder 

## 2017-01-18 LAB — CUP PACEART REMOTE DEVICE CHECK
Date Time Interrogation Session: 20180111123818
MDC IDC PG IMPLANT DT: 20171013

## 2017-02-06 ENCOUNTER — Ambulatory Visit (INDEPENDENT_AMBULATORY_CARE_PROVIDER_SITE_OTHER): Payer: Managed Care, Other (non HMO) | Admitting: *Deleted

## 2017-02-06 DIAGNOSIS — R55 Syncope and collapse: Secondary | ICD-10-CM

## 2017-02-06 LAB — CUP PACEART REMOTE DEVICE CHECK
MDC IDC PG IMPLANT DT: 20171013
MDC IDC SESS DTM: 20180210123854

## 2017-02-07 NOTE — Progress Notes (Signed)
Carelink Summary Report / Loop Recorder 

## 2017-02-12 LAB — CUP PACEART REMOTE DEVICE CHECK
Implantable Pulse Generator Implant Date: 20171013
MDC IDC SESS DTM: 20180312124021

## 2017-02-12 NOTE — Progress Notes (Signed)
Carelink summary report received. Battery status OK. Normal device function. No new symptom episodes, brady, or pause episodes. 4 tachy- 3 appear SVT, 1 appears AF RVR. 4 AF 48.3% +Eliquis +toprol.Monthly summary reports and ROV/PRN

## 2017-03-08 ENCOUNTER — Ambulatory Visit (INDEPENDENT_AMBULATORY_CARE_PROVIDER_SITE_OTHER): Payer: Managed Care, Other (non HMO) | Admitting: *Deleted

## 2017-03-08 DIAGNOSIS — R55 Syncope and collapse: Secondary | ICD-10-CM

## 2017-03-08 NOTE — Progress Notes (Signed)
Carelink Summary Report / Loop Recorder 

## 2017-03-10 ENCOUNTER — Encounter: Payer: Self-pay | Admitting: Internal Medicine

## 2017-03-20 LAB — CUP PACEART REMOTE DEVICE CHECK
Implantable Pulse Generator Implant Date: 20171013
MDC IDC SESS DTM: 20180411134003

## 2017-03-30 ENCOUNTER — Other Ambulatory Visit (HOSPITAL_COMMUNITY): Payer: Managed Care, Other (non HMO)

## 2017-03-31 ENCOUNTER — Other Ambulatory Visit: Payer: Self-pay | Admitting: Cardiology

## 2017-03-31 DIAGNOSIS — I480 Paroxysmal atrial fibrillation: Secondary | ICD-10-CM

## 2017-04-03 ENCOUNTER — Encounter: Payer: Managed Care, Other (non HMO) | Admitting: Internal Medicine

## 2017-04-07 ENCOUNTER — Ambulatory Visit (INDEPENDENT_AMBULATORY_CARE_PROVIDER_SITE_OTHER): Payer: Managed Care, Other (non HMO) | Admitting: *Deleted

## 2017-04-07 DIAGNOSIS — R55 Syncope and collapse: Secondary | ICD-10-CM | POA: Diagnosis not present

## 2017-04-10 ENCOUNTER — Ambulatory Visit (HOSPITAL_COMMUNITY): Payer: Managed Care, Other (non HMO) | Attending: Cardiology

## 2017-04-10 ENCOUNTER — Other Ambulatory Visit: Payer: Self-pay

## 2017-04-10 DIAGNOSIS — I480 Paroxysmal atrial fibrillation: Secondary | ICD-10-CM | POA: Diagnosis not present

## 2017-04-10 DIAGNOSIS — Z6831 Body mass index (BMI) 31.0-31.9, adult: Secondary | ICD-10-CM | POA: Insufficient documentation

## 2017-04-10 DIAGNOSIS — G4733 Obstructive sleep apnea (adult) (pediatric): Secondary | ICD-10-CM | POA: Insufficient documentation

## 2017-04-10 DIAGNOSIS — E669 Obesity, unspecified: Secondary | ICD-10-CM | POA: Diagnosis not present

## 2017-04-10 DIAGNOSIS — Z8249 Family history of ischemic heart disease and other diseases of the circulatory system: Secondary | ICD-10-CM | POA: Diagnosis not present

## 2017-04-10 DIAGNOSIS — E785 Hyperlipidemia, unspecified: Secondary | ICD-10-CM | POA: Diagnosis not present

## 2017-04-10 DIAGNOSIS — I10 Essential (primary) hypertension: Secondary | ICD-10-CM | POA: Diagnosis not present

## 2017-04-10 DIAGNOSIS — E119 Type 2 diabetes mellitus without complications: Secondary | ICD-10-CM | POA: Insufficient documentation

## 2017-04-10 NOTE — Progress Notes (Signed)
Carelink Summary Report / Loop Recorder 

## 2017-04-13 LAB — CUP PACEART REMOTE DEVICE CHECK
Date Time Interrogation Session: 20180511140718
MDC IDC PG IMPLANT DT: 20171013

## 2017-04-17 ENCOUNTER — Ambulatory Visit (INDEPENDENT_AMBULATORY_CARE_PROVIDER_SITE_OTHER): Payer: Managed Care, Other (non HMO) | Admitting: Internal Medicine

## 2017-04-17 ENCOUNTER — Encounter: Payer: Self-pay | Admitting: Internal Medicine

## 2017-04-17 VITALS — BP 122/74 | HR 94 | Ht 72.0 in | Wt 237.4 lb

## 2017-04-17 DIAGNOSIS — I480 Paroxysmal atrial fibrillation: Secondary | ICD-10-CM | POA: Diagnosis not present

## 2017-04-17 DIAGNOSIS — G4733 Obstructive sleep apnea (adult) (pediatric): Secondary | ICD-10-CM | POA: Diagnosis not present

## 2017-04-17 DIAGNOSIS — I10 Essential (primary) hypertension: Secondary | ICD-10-CM

## 2017-04-17 DIAGNOSIS — R55 Syncope and collapse: Secondary | ICD-10-CM

## 2017-04-17 LAB — CUP PACEART INCLINIC DEVICE CHECK
MDC IDC PG IMPLANT DT: 20171013
MDC IDC SESS DTM: 20180521112026

## 2017-04-17 MED ORDER — FLECAINIDE ACETATE 50 MG PO TABS: 50.0000 mg | ORAL_TABLET | Freq: Two times a day (BID) | ORAL | 3 refills | Status: DC

## 2017-04-17 NOTE — Patient Instructions (Addendum)
Medication Instructions:  Your physician has recommended you make the following change in your medication:  1) Start Flecainide 50 mg twice daily     Labwork: None ordered   Testing/Procedures: None ordered   Follow-Up: Your physician recommends that you schedule a follow-up appointment in: 4 weeks with Dr Johney FrameAllred .   Any Other Special Instructions Will Be Listed Below (If Applicable).     If you need a refill on your cardiac medications before your next appointment, please call your pharmacy.

## 2017-04-17 NOTE — Progress Notes (Signed)
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 male who presents today for routine electrophysiology followup.  Since last being seen in our clinic, the patient reports doing very well.   Unaware of his afib.  Remains active.  Uses CPAP.  Today, he denies symptoms of palpitations, chest pain, shortness of breath,  lower extremity edema, dizziness, presyncope, or syncope.  The patient is otherwise without complaint today.   Past Medical History:  Diagnosis Date  . Atrial fibrillation (HCC)   . Essential hypertension, benign   . 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 Obesity, unspecified   . Personal history of other specified diseases(V13.89)   . Pure hyperglyceridemia   . Type II or unspecified type diabetes mellitus without mention of complication, not stated as uncontrolled    Past Surgical History:  Procedure Laterality Date  . cornea radial keratotomy Bilateral 2005  . EP IMPLANTABLE DEVICE N/A 09/09/2016   Procedure: Loop Recorder Insertion;  Surgeon: Hillis Range, MD;  Location: [facility];  Service: Cardiovascular;  Laterality: N/A;  . KNEE ARTHROTOMY Right 1978  . NASAL SEPTOPLASTY W/ TURBINOPLASTY  1999    ROS- all systems are reviewed and negatives except as per HPI above  Current Outpatient Prescriptions  Medication Sig Dispense Refill  . amLODipine (NORVASC) 10 MG tablet TAKE 1 TABLET(10 MG) BY MOUTH DAILY 90 tablet 3  . apixaban (ELIQUIS) 5 MG TABS tablet Take 1 tablet (5 mg total) by mouth 2 (two) times daily. 60 tablet 10  . atorvastatin (LIPITOR) 10 MG tablet TAKE 1/2 TABLET BY MOUTH EVERY DAY 45 tablet 3  . ibuprofen (ADVIL,MOTRIN) 200 MG tablet Take 200 mg by mouth every 6 (six) hours as needed (pain).     Marland Kitchen ibuprofen (ADVIL,MOTRIN) 600 MG tablet Take 600 mg by mouth every 6 (six) hours as needed for moderate pain.    . Liraglutide (VICTOZA) 18 MG/3ML SOPN Inject 1.8 mg into the skin daily.     Marland Kitchen  lisinopril (PRINIVIL,ZESTRIL) 40 MG tablet TAKE 1 TABLET(40 MG) BY MOUTH DAILY 90 tablet 2  . loratadine (CLARITIN) 10 MG tablet Take 10 mg by mouth daily as needed for allergies.    . metFORMIN (GLUCOPHAGE-XR) 500 MG 24 hr tablet Take 2,000 mg by mouth daily with breakfast.    . metoprolol succinate (TOPROL-XL) 100 MG 24 hr tablet Take 1 tablet (100 mg total) by mouth 2 (two) times daily. 180 tablet 3  . Omega-3 Fatty Acids (FISH OIL) 1000 MG CAPS Take 1,000 mg by mouth daily.    . ONE TOUCH ULTRA TEST test strip CHECK BLOOD SUGAR ONCE D  5  . potassium chloride SA (K-DUR,KLOR-CON) 20 MEQ tablet TAKE 1 TABLET(20 MEQ) BY MOUTH DAILY 30 tablet 3  . sildenafil (REVATIO) 20 MG tablet Take 80 mg by mouth as needed (ED). Take as directed     No current facility-administered medications for this visit.     Physical Exam: Vitals:   04/17/17 0856  BP: 122/74  Pulse: 94  SpO2: 98%  Weight: 237 lb 6.4 oz (107.7 kg)  Height: 6' (1.829 m)    GEN- The patient is well appearing, alert and oriented x 3 today.   Head- normocephalic, atraumatic Eyes-  Sclera clear, conjunctiva pink Ears- hearing intact Oropharynx- clear Lungs- Clear to ausculation bilaterally, normal work of breathing Heart- irregular rate and rhythm, no murmurs, rubs or gallops, PMI not laterally displaced GI- soft, NT, ND, +  BS Extremities- no clubbing, cyanosis, or edema  ILR interrogation reviewed personally (see paceart)  Assessment and Plan:  1. Paroxysmal atrial fibrillation afib burden has increased to 48% by ILR He appears to be asymptomatic V rates are elevated about 30% of the time, though EF remains preserved (Echo from 5/18 reviewed in detail with him today) I think that we should try to achieve rhythm control and then reassess. I will start flecainide 50mg  BID Return in 3 weeks to reassess Continue anticoagulation  2. Syncope No recurrence since ILR was implanted  3. HTN Stable No change required  today  4. OSA Compliant with CPAP  Return to see me in 3-4 weeks on flecainide  Hillis RangeJames Tawan Degroote MD, [facility] 04/17/2017 10:11 AM

## 2017-04-23 ENCOUNTER — Other Ambulatory Visit: Payer: Self-pay | Admitting: Cardiology

## 2017-04-26 ENCOUNTER — Encounter: Payer: Self-pay | Admitting: Internal Medicine

## 2017-05-08 ENCOUNTER — Ambulatory Visit (INDEPENDENT_AMBULATORY_CARE_PROVIDER_SITE_OTHER): Payer: Managed Care, Other (non HMO) | Admitting: *Deleted

## 2017-05-08 DIAGNOSIS — R55 Syncope and collapse: Secondary | ICD-10-CM | POA: Diagnosis not present

## 2017-05-08 NOTE — Progress Notes (Signed)
Carelink Summary Report / Loop Recorder 

## 2017-05-15 ENCOUNTER — Encounter (INDEPENDENT_AMBULATORY_CARE_PROVIDER_SITE_OTHER): Payer: Self-pay

## 2017-05-15 ENCOUNTER — Ambulatory Visit (INDEPENDENT_AMBULATORY_CARE_PROVIDER_SITE_OTHER): Payer: Managed Care, Other (non HMO) | Admitting: Internal Medicine

## 2017-05-15 VITALS — BP 122/86 | HR 71 | Ht 72.0 in | Wt 235.0 lb

## 2017-05-15 DIAGNOSIS — I10 Essential (primary) hypertension: Secondary | ICD-10-CM

## 2017-05-15 DIAGNOSIS — R55 Syncope and collapse: Secondary | ICD-10-CM | POA: Diagnosis not present

## 2017-05-15 DIAGNOSIS — I480 Paroxysmal atrial fibrillation: Secondary | ICD-10-CM | POA: Diagnosis not present

## 2017-05-15 DIAGNOSIS — G4733 Obstructive sleep apnea (adult) (pediatric): Secondary | ICD-10-CM

## 2017-05-15 DIAGNOSIS — N522 Drug-induced erectile dysfunction: Secondary | ICD-10-CM

## 2017-05-15 MED ORDER — METOPROLOL SUCCINATE ER 100 MG PO TB24: 100.0000 mg | ORAL_TABLET | Freq: Every day | ORAL | 3 refills | Status: DC

## 2017-05-15 NOTE — Progress Notes (Signed)
PCP: Loyal JacobsonKalish, Michael, MD  [name] is a 61 y.o. male who presents today for routine electrophysiology followup.  Since last being seen in our clinic, the patient reports doing very well.  His afib burden is much lower, however, he cannot tell a real difference.  + erectile dysfunction.  Today, he denies symptoms of palpitations, chest pain, shortness of breath,  lower extremity edema, dizziness, presyncope, or syncope.  The patient is otherwise without complaint today.   Past Medical History:  Diagnosis Date  . Atrial fibrillation (HCC)   . Essential hypertension, benign   . 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 Obesity, unspecified   . Personal history of other specified diseases(V13.89)   . Pure hyperglyceridemia   . Type II or unspecified type diabetes mellitus without mention of complication, not stated as uncontrolled    Past Surgical History:  Procedure Laterality Date  . cornea radial keratotomy Bilateral 2005  . EP IMPLANTABLE DEVICE N/A 09/09/2016   Procedure: Loop Recorder Insertion;  Surgeon: Hillis RangeJames Eward Rutigliano, MD;  Location: [facility];  Service: Cardiovascular;  Laterality: N/A;  . KNEE ARTHROTOMY Right 1978  . NASAL SEPTOPLASTY W/ TURBINOPLASTY  1999    ROS- all systems are reviewed and negatives except as per HPI above  Current Outpatient Prescriptions  Medication Sig Dispense Refill  . amLODipine (NORVASC) 10 MG tablet TAKE 1 TABLET(10 MG) BY MOUTH DAILY 90 tablet 3  . apixaban (ELIQUIS) 5 MG TABS tablet Take 1 tablet (5 mg total) by mouth 2 (two) times daily. 60 tablet 10  . atorvastatin (LIPITOR) 10 MG tablet TAKE 1/2 TABLET BY MOUTH EVERY DAY 45 tablet 3  . flecainide (TAMBOCOR) 50 MG tablet Take 1 tablet (50 mg total) by mouth 2 (two) times daily. 180 tablet 3  . ibuprofen (ADVIL,MOTRIN) 200 MG tablet Take 200 mg by mouth every 6 (six) hours as needed (pain).     . Liraglutide (VICTOZA) 18 MG/3ML SOPN Inject 1.8 mg into the skin  daily.     Marland Kitchen. lisinopril (PRINIVIL,ZESTRIL) 40 MG tablet TAKE 1 TABLET(40 MG) BY MOUTH DAILY 90 tablet 2  . loratadine (CLARITIN) 10 MG tablet Take 10 mg by mouth daily as needed for allergies.    . metFORMIN (GLUCOPHAGE-XR) 500 MG 24 hr tablet Take 2,000 mg by mouth daily with breakfast.    . metoprolol succinate (TOPROL-XL) 100 MG 24 hr tablet Take 1 tablet (100 mg total) by mouth 2 (two) times daily. 180 tablet 3  . Omega-3 Fatty Acids (FISH OIL) 1000 MG CAPS Take 1,000 mg by mouth daily.    . ONE TOUCH ULTRA TEST test strip CHECK BLOOD SUGAR ONCE D  5  . potassium chloride SA (K-DUR,KLOR-CON) 20 MEQ tablet TAKE 1 TABLET(20 MEQ) BY MOUTH DAILY 30 tablet 3  . sildenafil (REVATIO) 20 MG tablet Take 80 mg by mouth as needed (ED). Take as directed     No current facility-administered medications for this visit.     Physical Exam: Vitals:   05/15/17 1427  BP: 122/86  Pulse: 71  SpO2: 97%  Weight: 235 lb (106.6 kg)  Height: 6' (1.829 m)    GEN- The patient is well appearing, alert and oriented x 3 today.   Head- normocephalic, atraumatic Eyes-  Sclera clear, conjunctiva pink Ears- hearing intact Oropharynx- clear Lungs- Clear to ausculation bilaterally, normal work of breathing Heart- Regular rate and rhythm, no murmurs, rubs or gallops, PMI not laterally displaced GI- soft, NT, ND, + BS  Extremities- no clubbing, cyanosis, or edema  EKG tracing ordered today is personally reviewed and shows sinus rhythm 71 bpm, Qtc 462 msec  Assessment and Plan:  1. Paroxysmal atrial fibrillation AF burden decreased from 48% to 14% with flecainide 50mg  BID I have offered to increase flecainde to 100mg  BID however he wishes to not make this change today. Echo is reviewed Continue anticoagulation Reduce toprol to 100mg  daily due to ED  2. Syncope No recurrence since ILR  3. HTN Stable No change required today  4. OSA Compliant with CPAP  5. ED Will refer to [facility] Reduce  toprol  Return to see me in 3 months Carelink   Hillis Range MD, [facility] 05/15/2017 2:42 PM

## 2017-05-15 NOTE — Patient Instructions (Signed)
Medication Instructions:  Your physician has recommended you make the following change in your medication:  1) Decrease Metoprolol to 100 mg daily   Labwork: None ordered   Testing/Procedures: None ordered   Follow-Up: You have been referred to [facility]  Your physician recommends that you schedule a follow-up appointment in: 3 months with Dr Johney FrameAllred   Any Other Special Instructions Will Be Listed Below (If Applicable).     If you need a refill on your cardiac medications before your next appointment, please call your pharmacy.

## 2017-05-16 LAB — CUP PACEART REMOTE DEVICE CHECK
Date Time Interrogation Session: 20180610140740
MDC IDC PG IMPLANT DT: 20171013

## 2017-06-04 ENCOUNTER — Other Ambulatory Visit: Payer: Self-pay | Admitting: Cardiology

## 2017-06-05 LAB — CUP PACEART INCLINIC DEVICE CHECK
Implantable Pulse Generator Implant Date: 20171013
MDC IDC SESS DTM: 20180618173215

## 2017-06-06 ENCOUNTER — Ambulatory Visit (INDEPENDENT_AMBULATORY_CARE_PROVIDER_SITE_OTHER): Payer: Managed Care, Other (non HMO) | Admitting: *Deleted

## 2017-06-06 DIAGNOSIS — R55 Syncope and collapse: Secondary | ICD-10-CM | POA: Diagnosis not present

## 2017-06-07 NOTE — Progress Notes (Signed)
Carelink Summary Report / Loop Recorder 

## 2017-06-12 ENCOUNTER — Telehealth (HOSPITAL_COMMUNITY): Payer: Self-pay | Admitting: *Deleted

## 2017-06-12 NOTE — Telephone Encounter (Signed)
Pt request samples of Eliquis, samples provided  Medication Samples have been provided to the patient.  Drug name: Eliquis       Strength: 5mg         Qty: 3  LOT: [id number]  Exp.Date: 10/20  Dosing instructions: 1 tab Twice daily   The patient has been instructed regarding the correct time, dose, and frequency of taking this medication, including desired effects and most common side effects.   George Carr 2:22 PM 06/12/2017

## 2017-06-17 LAB — CUP PACEART REMOTE DEVICE CHECK
Date Time Interrogation Session: 20180710182806
MDC IDC PG IMPLANT DT: 20171013

## 2017-07-06 ENCOUNTER — Ambulatory Visit (INDEPENDENT_AMBULATORY_CARE_PROVIDER_SITE_OTHER): Payer: Managed Care, Other (non HMO) | Admitting: *Deleted

## 2017-07-06 DIAGNOSIS — R55 Syncope and collapse: Secondary | ICD-10-CM

## 2017-07-06 NOTE — Progress Notes (Signed)
Carelink Summary Report / Loop Recorder 

## 2017-07-07 ENCOUNTER — Other Ambulatory Visit (HOSPITAL_COMMUNITY): Payer: Self-pay | Admitting: Cardiology

## 2017-07-13 ENCOUNTER — Other Ambulatory Visit: Payer: Self-pay | Admitting: Internal Medicine

## 2017-07-13 ENCOUNTER — Other Ambulatory Visit (HOSPITAL_COMMUNITY): Payer: Self-pay | Admitting: Cardiology

## 2017-07-13 LAB — CUP PACEART REMOTE DEVICE CHECK
Implantable Pulse Generator Implant Date: 20171013
MDC IDC SESS DTM: 20180809184014

## 2017-07-13 MED ORDER — AMLODIPINE BESYLATE 10 MG PO TABS: 10.0000 mg | ORAL_TABLET | Freq: Every day | ORAL | 3 refills | Status: DC

## 2017-08-07 ENCOUNTER — Ambulatory Visit (INDEPENDENT_AMBULATORY_CARE_PROVIDER_SITE_OTHER): Payer: Managed Care, Other (non HMO) | Admitting: *Deleted

## 2017-08-07 DIAGNOSIS — R55 Syncope and collapse: Secondary | ICD-10-CM | POA: Diagnosis not present

## 2017-08-07 NOTE — Progress Notes (Signed)
Carelink Summary Report / Loop Recorder 

## 2017-08-10 LAB — CUP PACEART REMOTE DEVICE CHECK
Date Time Interrogation Session: 20180908184004
MDC IDC PG IMPLANT DT: 20171013

## 2017-08-14 ENCOUNTER — Encounter: Payer: Self-pay | Admitting: Internal Medicine

## 2017-08-14 ENCOUNTER — Ambulatory Visit (INDEPENDENT_AMBULATORY_CARE_PROVIDER_SITE_OTHER): Payer: Managed Care, Other (non HMO) | Admitting: Internal Medicine

## 2017-08-14 VITALS — BP 120/84 | HR 70 | Ht 72.0 in | Wt 235.4 lb

## 2017-08-14 DIAGNOSIS — I10 Essential (primary) hypertension: Secondary | ICD-10-CM

## 2017-08-14 DIAGNOSIS — G4733 Obstructive sleep apnea (adult) (pediatric): Secondary | ICD-10-CM | POA: Diagnosis not present

## 2017-08-14 DIAGNOSIS — I480 Paroxysmal atrial fibrillation: Secondary | ICD-10-CM | POA: Diagnosis not present

## 2017-08-14 MED ORDER — METOPROLOL SUCCINATE ER 50 MG PO TB24: 50.0000 mg | ORAL_TABLET | Freq: Every day | ORAL | Status: DC

## 2017-08-14 NOTE — Progress Notes (Signed)
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 male who presents today for routine electrophysiology followup.  Since last being seen in our clinic, the patient reports doing very well.  Today, he denies symptoms of palpitations, chest pain, shortness of breath,  lower extremity edema, dizziness, presyncope, or syncope.  The patient is otherwise without complaint today.   Past Medical History:  Diagnosis Date  . Atrial fibrillation (HCC)   . Essential hypertension, benign   . 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 Obesity, unspecified   . Personal history of other specified diseases(V13.89)   . Pure hyperglyceridemia   . Type II or unspecified type diabetes mellitus without mention of complication, not stated as uncontrolled    Past Surgical History:  Procedure Laterality Date  . cornea radial keratotomy Bilateral 2005  . EP IMPLANTABLE DEVICE N/A 09/09/2016   Procedure: Loop Recorder Insertion;  Surgeon: Hillis Range, MD;  Location: [facility];  Service: Cardiovascular;  Laterality: N/A;  . KNEE ARTHROTOMY Right 1978  . NASAL SEPTOPLASTY W/ TURBINOPLASTY  1999    ROS- all systems are reviewed and negatives except as per HPI above  Current Outpatient Prescriptions  Medication Sig Dispense Refill  . amLODipine (NORVASC) 10 MG tablet Take 1 tablet (10 mg total) by mouth daily. 90 tablet 3  . apixaban (ELIQUIS) 5 MG TABS tablet Take 1 tablet (5 mg total) by mouth 2 (two) times daily. 60 tablet 10  . atorvastatin (LIPITOR) 10 MG tablet TAKE 1/2 TABLET BY MOUTH EVERY DAY 45 tablet 3  . flecainide (TAMBOCOR) 50 MG tablet Take 1 tablet (50 mg total) by mouth 2 (two) times daily. 180 tablet 3  . ibuprofen (ADVIL,MOTRIN) 200 MG tablet Take 200 mg by mouth every 6 (six) hours as needed (pain).     . Liraglutide (VICTOZA) 18 MG/3ML SOPN Inject 1.8 mg into the skin daily.     Marland Kitchen lisinopril (PRINIVIL,ZESTRIL) 40 MG tablet TAKE 1  TABLET(40 MG) BY MOUTH DAILY 90 tablet 2  . loratadine (CLARITIN) 10 MG tablet Take 10 mg by mouth daily as needed for allergies.    . metFORMIN (GLUCOPHAGE-XR) 500 MG 24 hr tablet Take 2,000 mg by mouth daily with breakfast.    . metoprolol succinate (TOPROL-XL) 100 MG 24 hr tablet Take 1 tablet (100 mg total) by mouth daily. 180 tablet 3  . Omega-3 Fatty Acids (FISH OIL) 1000 MG CAPS Take 1,000 mg by mouth daily.    . ONE TOUCH ULTRA TEST test strip CHECK BLOOD SUGAR ONCE D  5  . potassium chloride SA (K-DUR,KLOR-CON) 20 MEQ tablet TAKE 1 TABLET(20 MEQ) BY MOUTH DAILY 30 tablet 3  . sildenafil (REVATIO) 20 MG tablet Take 80 mg by mouth as needed (ED). Take as directed     No current facility-administered medications for this visit.     Physical Exam: Vitals:   08/14/17 1105  BP: 120/84  Pulse: 70  SpO2: 99%  Weight: 235 lb 6.4 oz (106.8 kg)  Height: 6' (1.829 m)    GEN- The patient is well appearing, alert and oriented x 3 today.   Head- normocephalic, atraumatic Eyes-  Sclera clear, conjunctiva pink Ears- hearing intact Oropharynx- clear Lungs- Clear to ausculation bilaterally, normal work of breathing Heart- Regular rate and rhythm, no murmurs, rubs or gallops, PMI not laterally displaced GI- soft, NT, ND, + BS Extremities- no clubbing, cyanosis, or edema  EKG tracing ordered today is  personally reviewed and shows sinus rhythm, normal ekg  Assessment and Plan:  1. Paroxysmal afib  Much improved (burden by ILR 48%-->14%-->3.7%) He wishes to continue on low dose flecainide Continue anticoagulation with eliquis for chads2vasc score of 2 Reduce toprol to  daily Consider ETT upon return  2. Syncope No recurrence s/p ILR  3. HTN Stable No change required today  4. OSA Compliant with CPAP  5. ED Reduce metoprolol to  daily today Referred to urology previously  carelink Return to see me in 6 months  Hillis Range MD, [facility] 08/14/2017 11:14 AM

## 2017-08-14 NOTE — Patient Instructions (Addendum)
Medication Instructions:  Your physician has recommended you make the following change in your medication:  1) Decrease Metoprolol to 50 mg daily   Labwork: None ordered   Testing/Procedures: None ordered   Follow-Up: Your physician wants you to follow-up in: 6 months with Dr Johney Frame Bonita Quin will receive a reminder letter in the mail two months in advance. If you don't receive a letter, please call our office to schedule the follow-up appointment.   Any Other Special Instructions Will Be Listed Below (If Applicable).     If you need a refill on your cardiac medications before your next appointment, please call your pharmacy.

## 2017-08-22 LAB — CUP PACEART INCLINIC DEVICE CHECK
Date Time Interrogation Session: 20180917150735
MDC IDC PG IMPLANT DT: 20171013

## 2017-09-04 ENCOUNTER — Ambulatory Visit (INDEPENDENT_AMBULATORY_CARE_PROVIDER_SITE_OTHER): Payer: Managed Care, Other (non HMO) | Admitting: *Deleted

## 2017-09-04 DIAGNOSIS — R55 Syncope and collapse: Secondary | ICD-10-CM

## 2017-09-05 NOTE — Progress Notes (Signed)
Carelink Summary Report / Loop Recorder 

## 2017-09-07 LAB — CUP PACEART REMOTE DEVICE CHECK
Implantable Pulse Generator Implant Date: 20171013
MDC IDC SESS DTM: 20181008193929

## 2017-09-25 ENCOUNTER — Other Ambulatory Visit: Payer: Self-pay | Admitting: Internal Medicine

## 2017-09-25 ENCOUNTER — Encounter (HOSPITAL_COMMUNITY): Payer: Self-pay

## 2017-09-25 ENCOUNTER — Telehealth (HOSPITAL_COMMUNITY): Payer: Self-pay

## 2017-09-25 MED ORDER — APIXABAN 5 MG PO TABS: 5.0000 mg | ORAL_TABLET | Freq: Two times a day (BID) | ORAL | 5 refills | Status: DC

## 2017-09-25 NOTE — Telephone Encounter (Signed)
Medication Samples have been provided to the patient.  Drug name: Eliquis       Strength: 5 mg        Qty: 2  LOT: [id number]  Exp.Date: 11/20  Dosing instructions: Take 1 tab twice a day   The patient has been instructed regarding the correct time, dose, and frequency of taking this medication, including desired effects and most common side effects.   Teresa CoombsKatrina Theodore Rahrig 4:35 PM 09/25/2017

## 2017-09-25 NOTE — Telephone Encounter (Signed)
Pt calling requesting a refill of Eliquis, sent to [facility] in [place] on [address] Please address

## 2017-09-25 NOTE — Telephone Encounter (Signed)
Refill sent in

## 2017-10-04 ENCOUNTER — Ambulatory Visit (INDEPENDENT_AMBULATORY_CARE_PROVIDER_SITE_OTHER): Payer: Managed Care, Other (non HMO) | Admitting: *Deleted

## 2017-10-04 DIAGNOSIS — R55 Syncope and collapse: Secondary | ICD-10-CM

## 2017-10-05 LAB — CUP PACEART REMOTE DEVICE CHECK
Date Time Interrogation Session: 20181107194021
MDC IDC PG IMPLANT DT: 20171013

## 2017-10-05 NOTE — Progress Notes (Signed)
Carelink Summary Report / Loop Recorder 

## 2017-11-03 ENCOUNTER — Ambulatory Visit (INDEPENDENT_AMBULATORY_CARE_PROVIDER_SITE_OTHER): Payer: Managed Care, Other (non HMO) | Admitting: *Deleted

## 2017-11-03 DIAGNOSIS — R55 Syncope and collapse: Secondary | ICD-10-CM | POA: Diagnosis not present

## 2017-11-06 NOTE — Progress Notes (Signed)
Carelink Summary Report / Loop Recorder 

## 2017-11-13 LAB — CUP PACEART REMOTE DEVICE CHECK
Date Time Interrogation Session: 20181207203800
MDC IDC PG IMPLANT DT: 20171013

## 2017-12-04 ENCOUNTER — Encounter (HOSPITAL_COMMUNITY): Payer: Self-pay

## 2017-12-04 ENCOUNTER — Ambulatory Visit (INDEPENDENT_AMBULATORY_CARE_PROVIDER_SITE_OTHER): Payer: Managed Care, Other (non HMO) | Admitting: *Deleted

## 2017-12-04 DIAGNOSIS — R55 Syncope and collapse: Secondary | ICD-10-CM | POA: Diagnosis not present

## 2017-12-04 NOTE — Progress Notes (Signed)
Carelink Summary Report / Loop Recorder 

## 2017-12-04 NOTE — Progress Notes (Unsigned)
Medication Samples have been provided to the patient.  Drug name: ELEQUIS       Strength: 5 MG        Qty: 2  LOT: [id number]  Exp.Date: 2/21  Dosing instructions: ***  The patient has been instructed regarding the correct time, dose, and frequency of taking this medication, including desired effects and most common side effects.   George CoombsKatrina Labrittany Carr 4:02 PM 12/04/2017

## 2017-12-05 ENCOUNTER — Telehealth: Payer: Self-pay | Admitting: Internal Medicine

## 2017-12-05 NOTE — Telephone Encounter (Signed)
New message   Patient calling to inquire about seeing both  Dr Johney FrameAllred and Dr Shirlee LatchMcLean. Patient wants to know from Dr Johney FrameAllred if he needs to continue care with both physicians. Please call

## 2017-12-05 NOTE — Telephone Encounter (Signed)
Spoke with patient regarding his concern about seeing 2 physicians on the same day (March 18th).  Dr. Johney FrameAllred and Dr. Shirlee LatchMcLean... He would prefer only seeing 1..George Carr

## 2017-12-07 NOTE — Telephone Encounter (Signed)
Tried to call patient today to discuss the reason for the 2 MD's.  His phone ws not taking calls.  I will try back later.

## 2017-12-15 LAB — CUP PACEART REMOTE DEVICE CHECK
Implantable Pulse Generator Implant Date: 20171013
MDC IDC SESS DTM: 20190106211145

## 2017-12-27 NOTE — Telephone Encounter (Signed)
Have been unable to reach patient to discuss.  He needs to continue with Dr Shirlee LatchMcLean for CHF and Allred to follow his device.  His phone is not in service to explain.

## 2018-01-02 ENCOUNTER — Ambulatory Visit (INDEPENDENT_AMBULATORY_CARE_PROVIDER_SITE_OTHER): Payer: Managed Care, Other (non HMO) | Admitting: *Deleted

## 2018-01-02 DIAGNOSIS — R55 Syncope and collapse: Secondary | ICD-10-CM | POA: Diagnosis not present

## 2018-01-03 NOTE — Progress Notes (Signed)
Carelink Summary Report / Loop Recorder 

## 2018-01-05 ENCOUNTER — Encounter: Payer: Self-pay | Admitting: Internal Medicine

## 2018-01-23 LAB — CUP PACEART REMOTE DEVICE CHECK
MDC IDC PG IMPLANT DT: 20171013
MDC IDC SESS DTM: 20190205211047

## 2018-01-24 ENCOUNTER — Other Ambulatory Visit: Payer: Self-pay | Admitting: Internal Medicine

## 2018-01-24 MED ORDER — METOPROLOL SUCCINATE ER 50 MG PO TB24: 50.0000 mg | ORAL_TABLET | Freq: Every day | ORAL | 1 refills | Status: DC

## 2018-02-05 ENCOUNTER — Ambulatory Visit (INDEPENDENT_AMBULATORY_CARE_PROVIDER_SITE_OTHER): Payer: Managed Care, Other (non HMO) | Admitting: *Deleted

## 2018-02-05 DIAGNOSIS — R55 Syncope and collapse: Secondary | ICD-10-CM

## 2018-02-05 NOTE — Progress Notes (Signed)
Carelink Summary Report / Loop Recorder 

## 2018-02-12 ENCOUNTER — Ambulatory Visit (HOSPITAL_COMMUNITY)
Admission: RE | Admit: 2018-02-12 | Discharge: 2018-02-12 | Disposition: A | Discharge status: Home / Self Care | Payer: Managed Care, Other (non HMO) | Source: Ambulatory Visit | Attending: Cardiology | Admitting: Cardiology

## 2018-02-12 ENCOUNTER — Encounter (HOSPITAL_COMMUNITY): Payer: Self-pay | Admitting: Cardiology

## 2018-02-12 ENCOUNTER — Other Ambulatory Visit: Payer: Self-pay

## 2018-02-12 ENCOUNTER — Encounter: Payer: Managed Care, Other (non HMO) | Admitting: Internal Medicine

## 2018-02-12 VITALS — BP 138/86 | HR 77 | Wt 234.8 lb

## 2018-02-12 DIAGNOSIS — E785 Hyperlipidemia, unspecified: Secondary | ICD-10-CM | POA: Insufficient documentation

## 2018-02-12 DIAGNOSIS — Z7901 Long term (current) use of anticoagulants: Secondary | ICD-10-CM | POA: Insufficient documentation

## 2018-02-12 DIAGNOSIS — Z79899 Other long term (current) drug therapy: Secondary | ICD-10-CM | POA: Diagnosis not present

## 2018-02-12 DIAGNOSIS — Z7984 Long term (current) use of oral hypoglycemic drugs: Secondary | ICD-10-CM | POA: Diagnosis not present

## 2018-02-12 DIAGNOSIS — E119 Type 2 diabetes mellitus without complications: Secondary | ICD-10-CM | POA: Insufficient documentation

## 2018-02-12 DIAGNOSIS — E669 Obesity, unspecified: Secondary | ICD-10-CM | POA: Diagnosis not present

## 2018-02-12 DIAGNOSIS — I10 Essential (primary) hypertension: Secondary | ICD-10-CM | POA: Insufficient documentation

## 2018-02-12 DIAGNOSIS — I480 Paroxysmal atrial fibrillation: Secondary | ICD-10-CM | POA: Diagnosis present

## 2018-02-12 DIAGNOSIS — G4733 Obstructive sleep apnea (adult) (pediatric): Secondary | ICD-10-CM | POA: Insufficient documentation

## 2018-02-12 LAB — BASIC METABOLIC PANEL
Anion gap: 11 (ref 5–15)
BUN: 13 mg/dL (ref 6–20)
CALCIUM: 9.4 mg/dL (ref 8.9–10.3)
CO2: 25 mmol/L (ref 22–32)
CREATININE: 0.79 mg/dL (ref 0.61–1.24)
Chloride: 101 mmol/L (ref 101–111)
GFR calc Af Amer: >60 mL/min (ref >60)
Glucose, Bld: 137 mg/dL — ABNORMAL HIGH (ref 65–99)
Potassium: 4.3 mmol/L (ref 3.5–5.1)
SODIUM: 137 mmol/L (ref 135–145)

## 2018-02-12 LAB — CBC
HCT: 42.3 % (ref 39.0–52.0)
Hemoglobin: 14.4 g/dL (ref 13.0–17.0)
MCH: 31.4 pg (ref 26.0–34.0)
MCHC: 34 g/dL (ref 30.0–36.0)
MCV: 92.2 fL (ref 78.0–100.0)
PLATELETS: 275 10*3/uL (ref 150–400)
RBC: 4.59 MIL/uL (ref 4.22–5.81)
RDW: 13.5 % (ref 11.5–15.5)
WBC: 6.8 10*3/uL (ref 4.0–10.5)

## 2018-02-12 LAB — LIPID PANEL
CHOLESTEROL: 117 mg/dL (ref 0–200)
HDL: 41 mg/dL (ref >40)
LDL Cholesterol: 49 mg/dL (ref 0–99)
TRIGLYCERIDES: 135 mg/dL (ref <150)
Total CHOL/HDL Ratio: 2.9 RATIO
VLDL: 27 mg/dL (ref 0–40)

## 2018-02-12 NOTE — Progress Notes (Signed)
Patient ID: George Carr, male   DOB: 11-26-1956, 62 y.o.   MRN: [id number] PCP: Dr. Leonette Carr [facility] Cardiology: Dr. Shirlee Carr  62 y.o. with history of type II diabetes, HTN, and atrial fibrillation.  Atrial fibrillation was first noted in 2/15 when he went for a colonoscopy.  He was unaware of it, no symptoms.  He was started on sotalol and Xarelto, later switched to Eliquis.  He went back into NSR.  He denies exertional dyspnea or chest pain.  He has good exercise tolerance.  He tries to walk for exercise as much as he can.  He has OSA and is using CPAP.  Echo was done in 3/15, showing preserved LV systolic function.  Lexiscan Cardiolite was done in 3/15 showing no ischemia.   Patient was doing well until 07/25/14.  On that day, he had a "weak spell" while walking through his kitchen.  He did not pass out completely but felt weak and lightheaded and slumped down to the floor.  He did not feel chest pain or palpitations.  He felt back to normal after about 30 seconds.  He has not had any further episodes like this.  He was seen in this office in on 8/28 after the "spell."  He was noted to be in atrial fibrillation with rate control.  He did not feel the atrial fibrillation.  Presyncopal episode was thought to be possible related to postural hypotension.  HCTZ was stopped and he has had no further dizziness.    Due to breakthrough atrial fibrillation, he was transitioned from sotalol to flecainide.  He now does not note palpitations or flutters.  Review of his ILR readings have shown very rare atrial fibrillation since starting flecainide.  He returns today for followup of atrial fibrillation. He is in NSR.  No complaints.  He walks daily for exercise.  No chest pain or exertional dyspnea. SBP is around 130 when he checks at home.  ECG: NSR, inferior T wave flattening (personally reviewed)  Labs (8/15): K 3.7, creatinine 0.9, LDL 78, HDL 42 Labs (9/15): K 3.9, creatinine 0.8, HCT 43.7, LDL 51, HDL  33, LFTs normal.   Labs (11/15): K 4.2, creatinine 0.8, LDL 52 Labs (1/16): K 4.1, creatinine 0.79 Labs (9/16): K 4.3, creatinine 0.79 Labs (6/17): Hgb 14.2, K 4, creatinine 0.7, LDL 55 Labs (10/17): K 3.9, creatinine 0.79  PMH:  1. Type II diabetes 2. HTN 3. Obesity 4. Hyperlipidemia 5. Atrial fibrillation: paroxysmal.  1st noted in 2/15 when he went for a colonoscopy.  He was started on sotalol and Xarelto, now on flecainide rather than sotalol.  Echo (3/15) with EF 60%, mild LVH, mild AI.   6. OSA on CPAP 7. Lexiscan Cardiolite (3/15) with EF 59%, no ischemia.  8. Presyncope (8/15)  FH: Father with CAD, atrial fibrillation.   SH: Married, 1 son, [name], works in [employer].    ROS: All systems reviewed and negative except as per HPI.   Current Outpatient Medications  Medication Sig Dispense Refill  . amLODipine (NORVASC) 10 MG tablet Take 1 tablet (10 mg total) by mouth daily. 90 tablet 3  . apixaban (ELIQUIS) 5 MG TABS tablet Take 1 tablet (5 mg total) by mouth 2 (two) times daily. 60 tablet 5  . atorvastatin (LIPITOR) 10 MG tablet TAKE 1/2 TABLET BY MOUTH EVERY DAY 45 tablet 3  . flecainide (TAMBOCOR) 50 MG tablet Take 1 tablet (50 mg total) by mouth 2 (two) times  daily. 180 tablet 3  . ibuprofen (ADVIL,MOTRIN) 200 MG tablet Take 200 mg by mouth every 6 (six) hours as needed (pain).     . Liraglutide (VICTOZA) 18 MG/3ML SOPN Inject 1.8 mg into the skin daily.     Marland Kitchen lisinopril (PRINIVIL,ZESTRIL) 40 MG tablet TAKE 1 TABLET(40 MG) BY MOUTH DAILY 90 tablet 2  . loratadine (CLARITIN) 10 MG tablet Take 10 mg by mouth daily as needed for allergies.    . metFORMIN (GLUCOPHAGE-XR) 500 MG 24 hr tablet Take 2,000 mg by mouth daily with breakfast.    . metoprolol succinate (TOPROL-XL) 50 MG 24 hr tablet Take 1 tablet (50 mg total) by mouth daily. 90 tablet 1  . ONE TOUCH ULTRA TEST test strip CHECK BLOOD SUGAR ONCE D  5  . potassium chloride SA (K-DUR,KLOR-CON) 20  MEQ tablet TAKE 1 TABLET(20 MEQ) BY MOUTH DAILY 30 tablet 3  . sildenafil (REVATIO) 20 MG tablet Take 80 mg by mouth as needed (ED). Take as directed     No current facility-administered medications for this encounter.    BP 138/86   Pulse 77   Wt 234 lb 12 oz (106.5 kg)   SpO2 100%   BMI 31.84 kg/m  General: NAD Neck: Thick, no JVD, no thyromegaly or thyroid nodule.  Lungs: Clear to auscultation bilaterally with normal respiratory effort. CV: Nondisplaced PMI.  Heart regular S1/S2, no S3/S4, no murmur.  No peripheral edema.  No carotid bruit.  Normal pedal pulses.  Abdomen: Soft, nontender, no hepatosplenomegaly, no distention.  Skin: Intact without lesions or rashes.  Neurologic: Alert and oriented x 3.  Psych: Normal affect. Extremities: No clubbing or cyanosis.  HEENT: Normal.   Assessment/Plan: 1. Paroxysmal atrial fibrillation:  Multiple atrial fibrillation risk factors including HTN, diabetes, and OSA. He is in NSR today.  Flecainide seems to be controlling atrial fibrillation better than sotalol.  Rare atrial fibrillation on ILR over the last year.  - Continue flecainide.  He needs ETT on flecainide to assess for ECG changes.  - Continue Toprol XL 50 mg daily (continue this for ETT).  - Continue Eliquis - CBC/BMET needed.  - If he develops breakthrough atrial fibrillation, ablation would be an option.    2. HTN: BP is controlled.  3. Hyperlipidemia: Check lipids today.  4. OSA: Continue to use CPAP.   5. Obesity: Needs weight loss.   Followup in 6 months.   George Carr 02/12/2018

## 2018-02-12 NOTE — Progress Notes (Signed)
Medication Samples have been provided to the patient.  Drug name: Eliquis       Strength: 5        Qty: 1  LOT: [id number]  Exp.Date: FEB/21  Dosing instructions: take 1 tab by mouth twice a day  The patient has been instructed regarding the correct time, dose, and frequency of taking this medication, including desired effects and most common side effects.   Teresa CoombsKatrina Leniyah Martell 11:09 AM 02/12/2018

## 2018-02-12 NOTE — Patient Instructions (Signed)
Your physician has requested that you have a stress echocardiogram. For further information please visit [email or web address]. Please follow instruction sheet as given.  Labs drawn today (if we do not call you, then your lab work was stable)   Your physician recommends that you schedule a follow-up appointment in: 6 months (August, 2019) with Dr. Shirlee LatchMcLean Please Call an Schedule Appointment

## 2018-02-14 ENCOUNTER — Other Ambulatory Visit (HOSPITAL_COMMUNITY): Payer: Self-pay

## 2018-02-14 DIAGNOSIS — I480 Paroxysmal atrial fibrillation: Secondary | ICD-10-CM

## 2018-02-20 ENCOUNTER — Encounter (HOSPITAL_COMMUNITY): Payer: Managed Care, Other (non HMO)

## 2018-02-23 ENCOUNTER — Ambulatory Visit (INDEPENDENT_AMBULATORY_CARE_PROVIDER_SITE_OTHER): Payer: Managed Care, Other (non HMO)

## 2018-02-23 DIAGNOSIS — I480 Paroxysmal atrial fibrillation: Secondary | ICD-10-CM

## 2018-02-23 LAB — EXERCISE TOLERANCE TEST
CHL CUP MPHR: 159 {beats}/min
CSEPEDS: 0 s
CSEPHR: 89 %
Estimated workload: 10.1 METS
Exercise duration (min): 8 min
Peak HR: 142 {beats}/min
RPE: 17
Rest HR: 71 {beats}/min

## 2018-03-09 ENCOUNTER — Ambulatory Visit (INDEPENDENT_AMBULATORY_CARE_PROVIDER_SITE_OTHER): Payer: Managed Care, Other (non HMO) | Admitting: *Deleted

## 2018-03-09 DIAGNOSIS — R55 Syncope and collapse: Secondary | ICD-10-CM | POA: Diagnosis not present

## 2018-03-12 NOTE — Progress Notes (Signed)
Carelink Summary Report / Loop Recorder 

## 2018-03-15 LAB — CUP PACEART REMOTE DEVICE CHECK
Date Time Interrogation Session: 20190310214003
MDC IDC PG IMPLANT DT: 20171013

## 2018-04-11 ENCOUNTER — Ambulatory Visit (INDEPENDENT_AMBULATORY_CARE_PROVIDER_SITE_OTHER): Payer: Managed Care, Other (non HMO) | Admitting: *Deleted

## 2018-04-11 DIAGNOSIS — I480 Paroxysmal atrial fibrillation: Secondary | ICD-10-CM

## 2018-04-12 NOTE — Progress Notes (Signed)
Carelink Summary Report / Loop Recorder 

## 2018-04-22 ENCOUNTER — Other Ambulatory Visit: Payer: Self-pay | Admitting: Internal Medicine

## 2018-04-30 ENCOUNTER — Other Ambulatory Visit: Payer: Self-pay | Admitting: Internal Medicine

## 2018-04-30 DIAGNOSIS — I480 Paroxysmal atrial fibrillation: Secondary | ICD-10-CM

## 2018-05-04 LAB — CUP PACEART REMOTE DEVICE CHECK
Date Time Interrogation Session: 20190515213611
MDC IDC PG IMPLANT DT: 20171013

## 2018-05-14 ENCOUNTER — Ambulatory Visit (INDEPENDENT_AMBULATORY_CARE_PROVIDER_SITE_OTHER): Payer: Managed Care, Other (non HMO) | Admitting: *Deleted

## 2018-05-14 DIAGNOSIS — R55 Syncope and collapse: Secondary | ICD-10-CM

## 2018-05-15 NOTE — Progress Notes (Signed)
Carelink Summary Report / Loop Recorder 

## 2018-05-23 ENCOUNTER — Other Ambulatory Visit: Payer: Self-pay | Admitting: Internal Medicine

## 2018-06-18 ENCOUNTER — Ambulatory Visit (INDEPENDENT_AMBULATORY_CARE_PROVIDER_SITE_OTHER): Payer: Managed Care, Other (non HMO) | Admitting: *Deleted

## 2018-06-18 DIAGNOSIS — R55 Syncope and collapse: Secondary | ICD-10-CM | POA: Diagnosis not present

## 2018-06-18 NOTE — Progress Notes (Signed)
Carelink Summary Report / Loop Recorder 

## 2018-06-20 LAB — CUP PACEART REMOTE DEVICE CHECK
Implantable Pulse Generator Implant Date: 20171013
MDC IDC SESS DTM: 20190617220631

## 2018-06-25 ENCOUNTER — Other Ambulatory Visit: Payer: Self-pay | Admitting: Internal Medicine

## 2018-07-19 ENCOUNTER — Ambulatory Visit (INDEPENDENT_AMBULATORY_CARE_PROVIDER_SITE_OTHER): Payer: Managed Care, Other (non HMO) | Admitting: *Deleted

## 2018-07-19 DIAGNOSIS — R55 Syncope and collapse: Secondary | ICD-10-CM | POA: Diagnosis not present

## 2018-07-20 NOTE — Progress Notes (Signed)
Carelink Summary Report / Loop Recorder 

## 2018-07-24 ENCOUNTER — Other Ambulatory Visit: Payer: Self-pay | Admitting: Internal Medicine

## 2018-08-02 LAB — CUP PACEART REMOTE DEVICE CHECK
Implantable Pulse Generator Implant Date: 20171013
MDC IDC SESS DTM: 20190720220557

## 2018-08-07 ENCOUNTER — Telehealth (HOSPITAL_COMMUNITY): Payer: Self-pay

## 2018-08-07 NOTE — Telephone Encounter (Signed)
Medication Samples have been provided to the patient.  Drug name: Eliquis       Strength: 5mg         Qty: 3  LOT: [id number]  Exp.Date: 11/21  Dosing instructions: Take 1 tablet (5 mg total) by mouth 2 (two) times daily.  The patient has been instructed regarding the correct time, dose, and frequency of taking this medication, including desired effects and most common side effects.   Mancel Bale 11:51 AM 08/07/2018

## 2018-08-09 ENCOUNTER — Telehealth (HOSPITAL_COMMUNITY): Payer: Self-pay | Admitting: *Deleted

## 2018-08-09 NOTE — Telephone Encounter (Signed)
[facility].  Patient may hold Eliquis 3 days prior to procedure per Dr. Shirlee LatchMcLean.   Clearance faxed today to [phone].

## 2018-08-21 ENCOUNTER — Ambulatory Visit (INDEPENDENT_AMBULATORY_CARE_PROVIDER_SITE_OTHER): Payer: Managed Care, Other (non HMO) | Admitting: *Deleted

## 2018-08-21 DIAGNOSIS — R55 Syncope and collapse: Secondary | ICD-10-CM | POA: Diagnosis not present

## 2018-08-21 LAB — CUP PACEART REMOTE DEVICE CHECK
Implantable Pulse Generator Implant Date: 20171013
MDC IDC SESS DTM: 20190822223614

## 2018-08-22 NOTE — Progress Notes (Signed)
Carelink Summary Report / Loop Recorder 

## 2018-08-27 LAB — CUP PACEART REMOTE DEVICE CHECK
Date Time Interrogation Session: 20190925014029
MDC IDC PG IMPLANT DT: 20171013

## 2018-08-31 ENCOUNTER — Ambulatory Visit (HOSPITAL_COMMUNITY)
Admission: RE | Admit: 2018-08-31 | Discharge: 2018-08-31 | Disposition: A | Discharge status: Home / Self Care | Payer: Managed Care, Other (non HMO) | Source: Ambulatory Visit | Attending: Cardiology | Admitting: Cardiology

## 2018-08-31 ENCOUNTER — Other Ambulatory Visit: Payer: Self-pay

## 2018-08-31 ENCOUNTER — Other Ambulatory Visit (HOSPITAL_COMMUNITY): Payer: Self-pay

## 2018-08-31 ENCOUNTER — Encounter (HOSPITAL_COMMUNITY): Payer: Self-pay | Admitting: Cardiology

## 2018-08-31 VITALS — BP 130/80 | HR 76 | Wt 236.1 lb

## 2018-08-31 DIAGNOSIS — Z791 Long term (current) use of non-steroidal anti-inflammatories (NSAID): Secondary | ICD-10-CM | POA: Insufficient documentation

## 2018-08-31 DIAGNOSIS — I5043 Acute on chronic combined systolic (congestive) and diastolic (congestive) heart failure: Secondary | ICD-10-CM | POA: Diagnosis not present

## 2018-08-31 DIAGNOSIS — I480 Paroxysmal atrial fibrillation: Secondary | ICD-10-CM

## 2018-08-31 DIAGNOSIS — Z7984 Long term (current) use of oral hypoglycemic drugs: Secondary | ICD-10-CM | POA: Insufficient documentation

## 2018-08-31 DIAGNOSIS — E119 Type 2 diabetes mellitus without complications: Secondary | ICD-10-CM | POA: Diagnosis not present

## 2018-08-31 DIAGNOSIS — E669 Obesity, unspecified: Secondary | ICD-10-CM | POA: Diagnosis not present

## 2018-08-31 DIAGNOSIS — I10 Essential (primary) hypertension: Secondary | ICD-10-CM | POA: Insufficient documentation

## 2018-08-31 DIAGNOSIS — R55 Syncope and collapse: Secondary | ICD-10-CM | POA: Diagnosis not present

## 2018-08-31 DIAGNOSIS — Z7901 Long term (current) use of anticoagulants: Secondary | ICD-10-CM | POA: Insufficient documentation

## 2018-08-31 DIAGNOSIS — Z79899 Other long term (current) drug therapy: Secondary | ICD-10-CM | POA: Insufficient documentation

## 2018-08-31 DIAGNOSIS — E785 Hyperlipidemia, unspecified: Secondary | ICD-10-CM | POA: Diagnosis not present

## 2018-08-31 DIAGNOSIS — G4733 Obstructive sleep apnea (adult) (pediatric): Secondary | ICD-10-CM | POA: Diagnosis not present

## 2018-08-31 LAB — BASIC METABOLIC PANEL
Anion gap: 8 (ref 5–15)
BUN: 11 mg/dL (ref 8–23)
CALCIUM: 9.9 mg/dL (ref 8.9–10.3)
CO2: 28 mmol/L (ref 22–32)
Chloride: 103 mmol/L (ref 98–111)
Creatinine, Ser: 0.72 mg/dL (ref 0.61–1.24)
GFR calc Af Amer: >60 mL/min (ref >60)
GLUCOSE: 132 mg/dL — AB (ref 70–99)
Potassium: 3.9 mmol/L (ref 3.5–5.1)
Sodium: 139 mmol/L (ref 135–145)

## 2018-08-31 LAB — CBC
HCT: 41.5 % (ref 39.0–52.0)
Hemoglobin: 13.5 g/dL (ref 13.0–17.0)
MCH: 30.6 pg (ref 26.0–34.0)
MCHC: 32.5 g/dL (ref 30.0–36.0)
MCV: 94.1 fL (ref 78.0–100.0)
PLATELETS: 279 10*3/uL (ref 150–400)
RBC: 4.41 MIL/uL (ref 4.22–5.81)
RDW: 12.8 % (ref 11.5–15.5)
WBC: 6.7 10*3/uL (ref 4.0–10.5)

## 2018-08-31 MED ORDER — LISINOPRIL 40 MG PO TABS: ORAL_TABLET | ORAL | 3 refills | Status: DC

## 2018-08-31 MED ORDER — FLECAINIDE ACETATE 50 MG PO TABS: 50.0000 mg | ORAL_TABLET | Freq: Two times a day (BID) | ORAL | 3 refills | Status: DC

## 2018-08-31 MED ORDER — AMLODIPINE BESYLATE 10 MG PO TABS: 10.0000 mg | ORAL_TABLET | Freq: Every day | ORAL | 3 refills | Status: DC

## 2018-08-31 MED ORDER — ATORVASTATIN CALCIUM 10 MG PO TABS: 5.0000 mg | ORAL_TABLET | Freq: Every day | ORAL | 3 refills | Status: DC

## 2018-08-31 NOTE — Progress Notes (Signed)
Patient ID: George Carr, male   DOB: 1956-06-13, 62 y.o.   MRN: [id number] PCP: Dr. Leonette Most [facility]) Cardiology: Dr. Shirlee Latch  62 y.o. with history of type II diabetes, HTN, and atrial fibrillation.  Atrial fibrillation was first noted in 2/15 when he went for a colonoscopy.  He was unaware of it, no symptoms.  He was started on sotalol and Xarelto, later switched to Eliquis.  He went back into NSR.  He denies exertional dyspnea or chest pain.  He has good exercise tolerance.  He tries to walk for exercise as much as he can.  He has OSA and is using CPAP.  Echo was done in 3/15, showing preserved LV systolic function.  Lexiscan Cardiolite was done in 3/15 showing no ischemia.   Patient was doing well until 07/25/14.  On that day, he had a "weak spell" while walking through his kitchen.  He did not pass out completely but felt weak and lightheaded and slumped down to the floor.  He did not feel chest pain or palpitations.  He felt back to normal after about 30 seconds.  He has not had any further episodes like this.  He was seen in this office in on 8/28 after the "spell."  He was noted to be in atrial fibrillation with rate control.  He did not feel the atrial fibrillation.  Presyncopal episode was thought to be possible related to postural hypotension.  HCTZ was stopped and he has had no further dizziness.    Due to breakthrough atrial fibrillation, he was transitioned from sotalol to flecainide.  He now does not note palpitations or flutters.  Review of his ILR readings have shown very rare atrial fibrillation since starting flecainide.  He returns today for followup of atrial fibrillation. He is in NSR today. No palpitations.  No chest pain or exertional dyspnea.  BP controlled, no lightheadedness.   ECG: NSR, inferior T wave flattening (personally reviewed)  Labs (8/15): K 3.7, creatinine 0.9, LDL 78, HDL 42 Labs (9/15): K 3.9, creatinine 0.8, HCT 43.7, LDL 51, HDL 33, LFTs normal.   Labs  (11/15): K 4.2, creatinine 0.8, LDL 52 Labs (1/16): K 4.1, creatinine 0.79 Labs (9/16): K 4.3, creatinine 0.79 Labs (6/17): Hgb 14.2, K 4, creatinine 0.7, LDL 55 Labs (10/17): K 3.9, creatinine 0.79 Labs (3/19): LDL 49, HDL 41, K 4.3, creatinine 4.09  PMH:  1. Type II diabetes 2. HTN 3. Obesity 4. Hyperlipidemia 5. Atrial fibrillation: paroxysmal.  1st noted in 2/15 when he went for a colonoscopy.  He was started on sotalol and Xarelto, now on flecainide rather than sotalol.  Echo (3/15) with EF 60%, mild LVH, mild AI.   - ETT (3/19): Normal.  6. OSA on CPAP 7. Lexiscan Cardiolite (3/15) with EF 59%, no ischemia.  8. Presyncope (8/15)  FH: Father with CAD, atrial fibrillation.   SH: Married, 1 son, lives in [place], [place], works in Air[employer].    ROS: All systems reviewed and negative except as per HPI.   Current Outpatient Medications  Medication Sig Dispense Refill  . apixaban (ELIQUIS) 5 MG TABS tablet Take 1 tablet (5 mg total) by mouth 2 (two) times daily. 60 tablet 5  . ibuprofen (ADVIL,MOTRIN) 200 MG tablet Take 200 mg by mouth every 6 (six) hours as needed (pain).     . Liraglutide (VICTOZA) 18 MG/3ML SOPN Inject 1.8 mg into the skin daily.     Marland Kitchen loratadine (CLARITIN) 10 MG tablet Take 10  mg by mouth daily as needed for allergies.    . metFORMIN (GLUCOPHAGE-XR) 500 MG 24 hr tablet Take 2,000 mg by mouth daily with breakfast.    . metoprolol succinate (TOPROL-XL) 50 MG 24 hr tablet Take 1 tablet (50 mg total) by mouth daily. 90 tablet 1  . ONE TOUCH ULTRA TEST test strip CHECK BLOOD SUGAR ONCE D  5  . sildenafil (REVATIO) 20 MG tablet Take 80 mg by mouth as needed (ED). Take as directed    . amLODipine (NORVASC) 10 MG tablet Take 1 tablet (10 mg total) by mouth daily. 90 tablet 3  . atorvastatin (LIPITOR) 10 MG tablet Take 0.5 tablets (5 mg total) by mouth daily. 45 tablet 3  . flecainide (TAMBOCOR) 50 MG tablet Take 1 tablet (50 mg total) by mouth 2 (two) times  daily. Please make yearly appt with Dr. Johney Frame for September. 1st attempt 60 tablet 3  . lisinopril (PRINIVIL,ZESTRIL) 40 MG tablet TAKE 1 TABLET(40 MG) BY MOUTH DAILY 90 tablet 3   No current facility-administered medications for this encounter.    BP 130/80   Pulse 76   Wt 107.1 kg (236 lb 2 oz)   SpO2 100%   BMI 32.02 kg/m  General: NAD Neck: No JVD, no thyromegaly or thyroid nodule.  Lungs: Clear to auscultation bilaterally with normal respiratory effort. CV: Nondisplaced PMI.  Heart regular S1/S2, no S3/S4, no murmur.  No peripheral edema.  No carotid bruit.  Normal pedal pulses.  Abdomen: Soft, nontender, no hepatosplenomegaly, no distention.  Skin: Intact without lesions or rashes.  Neurologic: Alert and oriented x 3.  Psych: Normal affect. Extremities: No clubbing or cyanosis.  HEENT: Normal.   Assessment/Plan: 1. Paroxysmal atrial fibrillation:  Multiple atrial fibrillation risk factors including HTN, diabetes, and OSA. He is in NSR today.  Flecainide seems to be controlling atrial fibrillation better than sotalol.  Rare atrial fibrillation on ILR over the last year. ETT on flecainide in 3/19 looked ok.  - Continue flecainide.  - Continue Toprol XL 50 mg daily.  - Continue Eliquis - CBC/BMET needed today.  - If he develops breakthrough atrial fibrillation, ablation would be an option.    2. HTN: BP is controlled.  3. Hyperlipidemia: Good lipids in 3/19.  4. OSA: Continue to use CPAP.   5. Obesity: Needs weight loss.   Followup in 6 months.   Marca Ancona 08/31/2018

## 2018-08-31 NOTE — Patient Instructions (Signed)
Today you have been seen at the Heart failure clinic at [facility]   Lab work done today: CBC, BMET  Follow up in 6 months with Dr. Shirlee Latch  EKG done today

## 2018-09-24 ENCOUNTER — Ambulatory Visit (INDEPENDENT_AMBULATORY_CARE_PROVIDER_SITE_OTHER): Payer: Managed Care, Other (non HMO) | Admitting: *Deleted

## 2018-09-24 DIAGNOSIS — R55 Syncope and collapse: Secondary | ICD-10-CM | POA: Diagnosis not present

## 2018-09-24 DIAGNOSIS — I5043 Acute on chronic combined systolic (congestive) and diastolic (congestive) heart failure: Secondary | ICD-10-CM

## 2018-09-24 NOTE — Progress Notes (Signed)
Carelink Summary Report / Loop Recorder 

## 2018-10-17 LAB — CUP PACEART REMOTE DEVICE CHECK
Date Time Interrogation Session: 20191028014006
MDC IDC PG IMPLANT DT: 20171013

## 2018-10-29 ENCOUNTER — Ambulatory Visit (INDEPENDENT_AMBULATORY_CARE_PROVIDER_SITE_OTHER): Payer: Managed Care, Other (non HMO)

## 2018-10-29 DIAGNOSIS — R55 Syncope and collapse: Secondary | ICD-10-CM | POA: Diagnosis not present

## 2018-10-29 NOTE — Progress Notes (Signed)
Carelink Summary Report / Loop Recorder 

## 2018-11-11 ENCOUNTER — Other Ambulatory Visit: Payer: Self-pay | Admitting: Internal Medicine

## 2018-11-12 NOTE — Telephone Encounter (Signed)
Eliquis 5mg  refill request received; pt os 62 yrs old, wt-106.8kg, Crea-0.72 on 08/31/18, last seen by Dr. Shirlee LatchMcLean on 08/31/18; will send in refill to requested pharmacy.

## 2018-11-25 LAB — CUP PACEART REMOTE DEVICE CHECK
Implantable Pulse Generator Implant Date: 20171013
MDC IDC SESS DTM: 20191202094005

## 2018-12-03 ENCOUNTER — Ambulatory Visit (INDEPENDENT_AMBULATORY_CARE_PROVIDER_SITE_OTHER): Payer: 59

## 2018-12-03 DIAGNOSIS — R55 Syncope and collapse: Secondary | ICD-10-CM

## 2018-12-03 LAB — CUP PACEART REMOTE DEVICE CHECK
Implantable Pulse Generator Implant Date: 20171013
MDC IDC SESS DTM: 20200104093552

## 2018-12-04 NOTE — Progress Notes (Signed)
Carelink Summary Report / Loop Recorder 

## 2018-12-23 ENCOUNTER — Other Ambulatory Visit (HOSPITAL_COMMUNITY): Payer: Self-pay | Admitting: Cardiology

## 2019-01-03 ENCOUNTER — Ambulatory Visit: Payer: Managed Care, Other (non HMO)

## 2019-01-04 LAB — CUP PACEART REMOTE DEVICE CHECK
Date Time Interrogation Session: 20200206124124
MDC IDC PG IMPLANT DT: 20171013

## 2019-01-24 ENCOUNTER — Other Ambulatory Visit: Payer: Self-pay | Admitting: Internal Medicine

## 2019-02-05 ENCOUNTER — Ambulatory Visit (INDEPENDENT_AMBULATORY_CARE_PROVIDER_SITE_OTHER): Payer: 59 | Admitting: *Deleted

## 2019-02-05 DIAGNOSIS — R55 Syncope and collapse: Secondary | ICD-10-CM

## 2019-02-06 ENCOUNTER — Other Ambulatory Visit: Payer: Self-pay

## 2019-02-06 LAB — CUP PACEART REMOTE DEVICE CHECK
Implantable Pulse Generator Implant Date: 20171013
MDC IDC SESS DTM: 20200310124255

## 2019-02-12 NOTE — Progress Notes (Signed)
Carelink Summary Report / Loop Recorder 

## 2019-03-11 ENCOUNTER — Ambulatory Visit (INDEPENDENT_AMBULATORY_CARE_PROVIDER_SITE_OTHER): Payer: 59 | Admitting: *Deleted

## 2019-03-11 ENCOUNTER — Other Ambulatory Visit: Payer: Self-pay

## 2019-03-11 DIAGNOSIS — R55 Syncope and collapse: Secondary | ICD-10-CM | POA: Diagnosis not present

## 2019-03-11 DIAGNOSIS — I5043 Acute on chronic combined systolic (congestive) and diastolic (congestive) heart failure: Secondary | ICD-10-CM

## 2019-03-11 LAB — CUP PACEART REMOTE DEVICE CHECK
Date Time Interrogation Session: 20200412133840
Implantable Pulse Generator Implant Date: 20171013

## 2019-03-17 ENCOUNTER — Other Ambulatory Visit: Payer: Self-pay | Admitting: Internal Medicine

## 2019-03-19 ENCOUNTER — Encounter (HOSPITAL_COMMUNITY): Payer: Managed Care, Other (non HMO) | Admitting: Cardiology

## 2019-03-19 NOTE — Progress Notes (Signed)
Carelink Summary Report / Loop Recorder 

## 2019-03-26 ENCOUNTER — Other Ambulatory Visit: Payer: Self-pay

## 2019-03-26 ENCOUNTER — Ambulatory Visit (HOSPITAL_COMMUNITY)
Admission: RE | Admit: 2019-03-26 | Discharge: 2019-03-26 | Disposition: A | Discharge status: Home / Self Care | Payer: 59 | Source: Ambulatory Visit | Attending: Cardiology | Admitting: Cardiology

## 2019-03-26 DIAGNOSIS — I10 Essential (primary) hypertension: Secondary | ICD-10-CM

## 2019-03-26 DIAGNOSIS — I480 Paroxysmal atrial fibrillation: Secondary | ICD-10-CM | POA: Diagnosis not present

## 2019-03-26 NOTE — Progress Notes (Signed)
Heart Failure TeleHealth Note  Due to national recommendations of social distancing due to COVID 19, Audio/video telehealth visit is felt to be most appropriate for this patient at this time.  See MyChart message from today for patient consent regarding telehealth [facility].  Date:  03/26/2019   ID:  George Carr, DOB 22-Jan-1956, MRN [id number]  Location: Home  Provider location: [facility] Type of Visit: Established patient   PCP:  George Carr, Michael, MD  Cardiologist: George Carr  Chief Complaint: Mild fatigue   History of Present Illness: George Carr is a 63 y.o. male who presents via audio/video conferencing for a telehealth visit today.     he denies symptoms worrisome for COVID 19.   Patient has a history of type II diabetes, HTN, and atrial fibrillation.  Atrial fibrillation was first noted in 2/15 when he went for a colonoscopy.  He was unaware of it, no symptoms.  He was started on sotalol and Xarelto, later switched to Eliquis.  He went back into NSR.  He denies exertional dyspnea or chest pain.  He has good exercise tolerance.  He tries to walk for exercise as much as he can.  He has OSA and is using CPAP.  Echo was done in 3/15, showing preserved LV systolic function.  Lexiscan Cardiolite was done in 3/15 showing no ischemia.   Patient was doing well until 07/25/14.  On that day, he had a "weak spell" while walking through his kitchen.  He did not pass out completely but felt weak and lightheaded and slumped down to the floor.  He did not feel chest pain or palpitations.  He felt back to normal after about 30 seconds.  He has not had any further episodes like this.  He was seen in this office in on 8/28 after the "spell."  He was noted to be in atrial fibrillation with rate control.  He did not feel the atrial fibrillation.  Presyncopal episode was thought to be possible related to postural hypotension.  HCTZ was stopped and he has had no  further dizziness.    Due to breakthrough atrial fibrillation, he was transitioned from sotalol to flecainide.  He now does not note palpitations or flutters.   He has been doing well. I reviewed his LINQ monitor readings, no atrial fibrillation.  He has been losing weight, says weight is down about 15 lbs.  Using CPAP.  No exertional dyspnea or chest pain.  Exercising and dieting with his wife. No BRBPR/melena.   Labs (10/19): K 3.9, creatinine 0.72, hgb 13.5 Labs (1/20): K 4.3, creatinine 0.81  Current Outpatient Medications  Medication Sig Dispense Refill  . amLODipine (NORVASC) 10 MG tablet Take 1 tablet (10 mg total) by mouth daily. 90 tablet 3  . atorvastatin (LIPITOR) 10 MG tablet Take 0.5 tablets (5 mg total) by mouth daily. 45 tablet 3  . ELIQUIS 5 MG TABS tablet TAKE 1 TABLET(5 MG) BY MOUTH TWICE DAILY 60 tablet 10  . flecainide (TAMBOCOR) 50 MG tablet TAKE 1 TABLET BY MOUTH TWICE DAILY 60 tablet 3  . ibuprofen (ADVIL,MOTRIN) 200 MG tablet Take 200 mg by mouth every 6 (six) hours as needed (pain).     . Liraglutide (VICTOZA) 18 MG/3ML SOPN Inject 1.8 mg into the skin daily.     Marland Kitchen. lisinopril (PRINIVIL,ZESTRIL) 40 MG tablet TAKE 1 TABLET(40 MG) BY MOUTH DAILY 90 tablet 3  . loratadine (CLARITIN) 10 MG tablet Take 10  mg by mouth daily as needed for allergies.    . metFORMIN (GLUCOPHAGE-XR) 500 MG 24 hr tablet Take 2,000 mg by mouth daily with breakfast.    . metoprolol succinate (TOPROL-XL) 50 MG 24 hr tablet TAKE 1 TABLET BY MOUTH DAILY 15 tablet 0  . ONE TOUCH ULTRA TEST test strip CHECK BLOOD SUGAR ONCE D  5  . sildenafil (REVATIO) 20 MG tablet Take 80 mg by mouth as needed (ED). Take as directed     No current facility-administered medications for this encounter.     Allergies:   Penicillins   Social History:  The patient  reports that he is a non-smoker but has been exposed to tobacco smoke. He has never used smokeless tobacco. He reports current alcohol use. He reports that  he does not use drugs.   Family History:  The patient's family history includes Diabetes in his mother and unknown relative; Heart disease in his maternal aunt; Hypertension in his father; Other in his father; Stroke in his father.   ROS:  Please see the history of present illness.   All other systems are personally reviewed and negative.   Exam:  (Video/Tele Health Call; Exam is subjective and or/visual.) BP 134/80 General:  Speaks in full sentences. No resp difficulty. Neck: No JVD Lungs: Normal respiratory effort with conversation.  Abdomen: Non-distended per patient report Extremities: Pt denies edema. Neuro: Alert & oriented x 3.   Recent Labs: 08/31/2018: BUN 11; Creatinine, Ser 0.72; Hemoglobin 13.5; Platelets 279; Potassium 3.9; Sodium 139  Personally reviewed   Wt Readings from Last 3 Encounters:  08/31/18 107.1 kg (236 lb 2 oz)  02/12/18 106.5 kg (234 lb 12 oz)  08/14/17 106.8 kg (235 lb 6.4 oz)      ASSESSMENT AND PLAN:  1. Paroxysmal atrial fibrillation:  Multiple atrial fibrillation risk factors including HTN, diabetes, and OSA.  No recent atrial fibrillation by interrogation of LINQ monitor. Flecainide seems to be controlling atrial fibrillation better than sotalol.  ETT on flecainide in 3/19 looked ok.  - Continue flecainide.  - Continue Toprol XL 50 mg daily.  - Continue Eliquis, will arrange for CBC.  - If he develops breakthrough atrial fibrillation, ablation would be an option.    2. HTN: BP is controlled.  3. Hyperlipidemia: I will arrange to check lipids.   4. OSA: Continue to use CPAP.   5. Obesity: Continue to work on weight loss, doing well so far.   COVID screen The patient does not have any symptoms that suggest any further testing/ screening at this time.  Social distancing reinforced today.  Patient Risk: After full review of this patients clinical status, I feel that they are at moderate risk for cardiac decompensation at this time.  Relevant  cardiac medications were reviewed at length with the patient today. The patient does not have concerns regarding their medications at this time.   Recommended follow-up:  6 months  Today, I have spent 15 minutes with the patient with telehealth technology discussing the above issues .    Signed, Marca Ancona, MD  03/26/2019 6:06 PM  [facility] [address] [facility] [place] [address] [phone] (office) [phone] (fax)

## 2019-03-26 NOTE — Patient Instructions (Signed)
Lab work will need to be done. Please call us in order to schedule lab appointment.  Please follow up with Dr. Shirlee Latch in 6 months

## 2019-03-26 NOTE — Progress Notes (Addendum)
Called pt and left voicemail for him to call and schedule lab work. avs to be mailed.

## 2019-04-03 ENCOUNTER — Ambulatory Visit (HOSPITAL_COMMUNITY)
Admission: RE | Admit: 2019-04-03 | Discharge: 2019-04-03 | Disposition: A | Discharge status: Home / Self Care | Payer: 59 | Source: Ambulatory Visit | Attending: Internal Medicine | Admitting: Internal Medicine

## 2019-04-03 ENCOUNTER — Other Ambulatory Visit: Payer: Self-pay

## 2019-04-03 DIAGNOSIS — I10 Essential (primary) hypertension: Secondary | ICD-10-CM | POA: Diagnosis present

## 2019-04-03 LAB — CBC
HCT: 41.1 % (ref 39.0–52.0)
Hemoglobin: 13.5 g/dL (ref 13.0–17.0)
MCH: 30.7 pg (ref 26.0–34.0)
MCHC: 32.8 g/dL (ref 30.0–36.0)
MCV: 93.4 fL (ref 80.0–100.0)
Platelets: 273 10*3/uL (ref 150–400)
RBC: 4.4 MIL/uL (ref 4.22–5.81)
RDW: 13.3 % (ref 11.5–15.5)
WBC: 6.7 10*3/uL (ref 4.0–10.5)
nRBC: 0 % (ref 0.0–0.2)

## 2019-04-03 LAB — LIPID PANEL
Cholesterol: 112 mg/dL (ref 0–200)
HDL: 51 mg/dL (ref >40)
LDL Cholesterol: 50 mg/dL (ref 0–99)
Total CHOL/HDL Ratio: 2.2 RATIO
Triglycerides: 53 mg/dL (ref <150)
VLDL: 11 mg/dL (ref 0–40)

## 2019-04-12 ENCOUNTER — Ambulatory Visit (INDEPENDENT_AMBULATORY_CARE_PROVIDER_SITE_OTHER): Payer: 59 | Admitting: *Deleted

## 2019-04-12 ENCOUNTER — Other Ambulatory Visit: Payer: Self-pay

## 2019-04-12 DIAGNOSIS — R55 Syncope and collapse: Secondary | ICD-10-CM | POA: Diagnosis not present

## 2019-04-12 LAB — CUP PACEART REMOTE DEVICE CHECK
Date Time Interrogation Session: 20200515134141
Implantable Pulse Generator Implant Date: 20171013

## 2019-04-15 NOTE — Progress Notes (Signed)
Carelink Summary Report / Loop Recorder 

## 2019-04-21 ENCOUNTER — Other Ambulatory Visit: Payer: Self-pay | Admitting: Internal Medicine

## 2019-04-28 ENCOUNTER — Other Ambulatory Visit (HOSPITAL_COMMUNITY): Payer: Self-pay | Admitting: Cardiology

## 2019-05-15 ENCOUNTER — Ambulatory Visit (INDEPENDENT_AMBULATORY_CARE_PROVIDER_SITE_OTHER): Payer: 59 | Admitting: *Deleted

## 2019-05-15 DIAGNOSIS — R55 Syncope and collapse: Secondary | ICD-10-CM

## 2019-05-15 LAB — CUP PACEART REMOTE DEVICE CHECK
Date Time Interrogation Session: 20200617134038
Implantable Pulse Generator Implant Date: 20171013

## 2019-05-26 NOTE — Progress Notes (Signed)
Carelink Summary Report / Loop Recorder 

## 2019-06-17 ENCOUNTER — Ambulatory Visit (INDEPENDENT_AMBULATORY_CARE_PROVIDER_SITE_OTHER): Payer: 59 | Admitting: *Deleted

## 2019-06-17 DIAGNOSIS — R55 Syncope and collapse: Secondary | ICD-10-CM | POA: Diagnosis not present

## 2019-06-17 LAB — CUP PACEART REMOTE DEVICE CHECK
Date Time Interrogation Session: 20200720154010
Implantable Pulse Generator Implant Date: 20171013

## 2019-07-01 NOTE — Progress Notes (Signed)
Carelink Summary Report / Loop Recorder 

## 2019-07-22 ENCOUNTER — Ambulatory Visit (INDEPENDENT_AMBULATORY_CARE_PROVIDER_SITE_OTHER): Payer: 59 | Admitting: *Deleted

## 2019-07-22 DIAGNOSIS — I480 Paroxysmal atrial fibrillation: Secondary | ICD-10-CM

## 2019-07-22 DIAGNOSIS — R55 Syncope and collapse: Secondary | ICD-10-CM | POA: Diagnosis not present

## 2019-07-23 LAB — CUP PACEART REMOTE DEVICE CHECK
Date Time Interrogation Session: 20200822160839
Implantable Pulse Generator Implant Date: 20171013

## 2019-07-29 NOTE — Progress Notes (Signed)
Carelink Summary Report / Loop Recorder 

## 2019-08-18 ENCOUNTER — Other Ambulatory Visit (HOSPITAL_COMMUNITY): Payer: Self-pay | Admitting: Cardiology

## 2019-08-18 ENCOUNTER — Other Ambulatory Visit: Payer: Self-pay | Admitting: Internal Medicine

## 2019-08-20 NOTE — Telephone Encounter (Signed)
This is a CHF pt 

## 2019-08-22 ENCOUNTER — Ambulatory Visit (INDEPENDENT_AMBULATORY_CARE_PROVIDER_SITE_OTHER): Payer: 59 | Admitting: *Deleted

## 2019-08-22 DIAGNOSIS — R55 Syncope and collapse: Secondary | ICD-10-CM | POA: Diagnosis not present

## 2019-08-22 LAB — CUP PACEART REMOTE DEVICE CHECK
Date Time Interrogation Session: 20200924155339
Implantable Pulse Generator Implant Date: 20171013

## 2019-08-25 ENCOUNTER — Other Ambulatory Visit (HOSPITAL_COMMUNITY): Payer: Self-pay | Admitting: Cardiology

## 2019-08-27 ENCOUNTER — Other Ambulatory Visit (HOSPITAL_COMMUNITY): Payer: Self-pay

## 2019-08-27 MED ORDER — METOPROLOL SUCCINATE ER 50 MG PO TB24: 50.0000 mg | ORAL_TABLET | Freq: Every day | ORAL | 3 refills | Status: DC

## 2019-08-28 NOTE — Progress Notes (Signed)
Carelink Summary Report / Loop Recorder 

## 2019-09-24 ENCOUNTER — Ambulatory Visit (INDEPENDENT_AMBULATORY_CARE_PROVIDER_SITE_OTHER): Payer: 59 | Admitting: *Deleted

## 2019-09-24 DIAGNOSIS — I480 Paroxysmal atrial fibrillation: Secondary | ICD-10-CM

## 2019-09-24 DIAGNOSIS — R55 Syncope and collapse: Secondary | ICD-10-CM | POA: Diagnosis not present

## 2019-09-25 LAB — CUP PACEART REMOTE DEVICE CHECK
Date Time Interrogation Session: 20201027181056
Implantable Pulse Generator Implant Date: 20171013

## 2019-10-14 NOTE — Progress Notes (Signed)
Carelink Summary Report / Loop Recorder 

## 2019-10-21 ENCOUNTER — Other Ambulatory Visit: Payer: Self-pay

## 2019-10-21 ENCOUNTER — Encounter (HOSPITAL_COMMUNITY): Payer: Self-pay | Admitting: Cardiology

## 2019-10-21 ENCOUNTER — Ambulatory Visit (HOSPITAL_COMMUNITY)
Admission: RE | Admit: 2019-10-21 | Discharge: 2019-10-21 | Disposition: A | Discharge status: Home / Self Care | Payer: 59 | Source: Ambulatory Visit | Attending: Cardiology | Admitting: Cardiology

## 2019-10-21 VITALS — BP 131/76 | HR 71 | Wt 232.6 lb

## 2019-10-21 DIAGNOSIS — G4733 Obstructive sleep apnea (adult) (pediatric): Secondary | ICD-10-CM | POA: Diagnosis not present

## 2019-10-21 DIAGNOSIS — Z79899 Other long term (current) drug therapy: Secondary | ICD-10-CM | POA: Insufficient documentation

## 2019-10-21 DIAGNOSIS — Z8249 Family history of ischemic heart disease and other diseases of the circulatory system: Secondary | ICD-10-CM | POA: Diagnosis not present

## 2019-10-21 DIAGNOSIS — E785 Hyperlipidemia, unspecified: Secondary | ICD-10-CM | POA: Diagnosis not present

## 2019-10-21 DIAGNOSIS — E119 Type 2 diabetes mellitus without complications: Secondary | ICD-10-CM | POA: Insufficient documentation

## 2019-10-21 DIAGNOSIS — E669 Obesity, unspecified: Secondary | ICD-10-CM | POA: Insufficient documentation

## 2019-10-21 DIAGNOSIS — Z7901 Long term (current) use of anticoagulants: Secondary | ICD-10-CM | POA: Diagnosis not present

## 2019-10-21 DIAGNOSIS — Z823 Family history of stroke: Secondary | ICD-10-CM | POA: Insufficient documentation

## 2019-10-21 DIAGNOSIS — Z880 Allergy status to penicillin: Secondary | ICD-10-CM | POA: Diagnosis not present

## 2019-10-21 DIAGNOSIS — Z7984 Long term (current) use of oral hypoglycemic drugs: Secondary | ICD-10-CM | POA: Diagnosis not present

## 2019-10-21 DIAGNOSIS — I10 Essential (primary) hypertension: Secondary | ICD-10-CM | POA: Diagnosis not present

## 2019-10-21 DIAGNOSIS — Z833 Family history of diabetes mellitus: Secondary | ICD-10-CM | POA: Diagnosis not present

## 2019-10-21 DIAGNOSIS — I480 Paroxysmal atrial fibrillation: Secondary | ICD-10-CM | POA: Insufficient documentation

## 2019-10-21 DIAGNOSIS — Z6831 Body mass index (BMI) 31.0-31.9, adult: Secondary | ICD-10-CM | POA: Diagnosis not present

## 2019-10-21 LAB — CBC
HCT: 41.6 % (ref 39.0–52.0)
Hemoglobin: 13.8 g/dL (ref 13.0–17.0)
MCH: 31.2 pg (ref 26.0–34.0)
MCHC: 33.2 g/dL (ref 30.0–36.0)
MCV: 93.9 fL (ref 80.0–100.0)
Platelets: 294 10*3/uL (ref 150–400)
RBC: 4.43 MIL/uL (ref 4.22–5.81)
RDW: 12.8 % (ref 11.5–15.5)
WBC: 6.4 10*3/uL (ref 4.0–10.5)
nRBC: 0 % (ref 0.0–0.2)

## 2019-10-21 LAB — BASIC METABOLIC PANEL
Anion gap: 9 (ref 5–15)
BUN: 16 mg/dL (ref 8–23)
CO2: 28 mmol/L (ref 22–32)
Calcium: 9.8 mg/dL (ref 8.9–10.3)
Chloride: 102 mmol/L (ref 98–111)
Creatinine, Ser: 0.79 mg/dL (ref 0.61–1.24)
GFR calc Af Amer: >60 mL/min (ref >60)
GFR calc non Af Amer: >60 mL/min (ref >60)
Glucose, Bld: 104 mg/dL — ABNORMAL HIGH (ref 70–99)
Potassium: 3.5 mmol/L (ref 3.5–5.1)
Sodium: 139 mmol/L (ref 135–145)

## 2019-10-21 NOTE — Patient Instructions (Signed)
No medication changes  Labs today We will only contact you if something comes back abnormal or we need to make some changes. Otherwise no news is good news!  Your physician recommends that you schedule a follow-up appointment in: 6 months with Dr Aundra Dubin.  You will get a call to schedule this appointment.  If you do not get this call, please call our office.  Please call office at [phone] option 2 if you have any questions or concerns.   At the [facility], you and your health needs are our priority. As part of our continuing mission to provide you with exceptional heart care, we have created designated Provider Care Teams. These Care Teams include your primary Cardiologist (physician) and Advanced Practice Providers (APPs- Physician Assistants and Nurse Practitioners) who all work together to provide you with the care you need, when you need it.   You may see any of the following providers on your designated Care Team at your next follow up: Marland Kitchen Dr Glori Bickers . Dr Loralie Champagne . Darrick Grinder, NP . Lyda Jester, PA   Please be sure to bring in all your medications bottles to every appointment.

## 2019-10-21 NOTE — Progress Notes (Signed)
ID:  George Carr, DOB June 30, 1956, MRN [id number]   Provider location: [facility] Type of Visit: Established patient   PCP:  Loyal Jacobson, MD  Cardiologist: Dr. Shirlee Latch   History of Present Illness: George Carr is a 63 y.o. male who has a history of type II diabetes, HTN, and atrial fibrillation.  Atrial fibrillation was first noted in 2/15 when he went for a colonoscopy.  He was unaware of it, no symptoms.  He was started on sotalol and Xarelto, later switched to Eliquis.  He went back into NSR.  He denies exertional dyspnea or chest pain.  He has good exercise tolerance.  He tries to walk for exercise as much as he can.  He has OSA and is using CPAP.  Echo was done in 3/15, showing preserved LV systolic function.  Lexiscan Cardiolite was done in 3/15 showing no ischemia.   Patient was doing well until 07/25/14.  On that day, he had a "weak spell" while walking through his kitchen.  He did not pass out completely but felt weak and lightheaded and slumped down to the floor.  He did not feel chest pain or palpitations.  He felt back to normal after about 30 seconds.  He has not had any further episodes like this.  He was seen in this office in on 8/28 after the "spell."  He was noted to be in atrial fibrillation with rate control.  He did not feel the atrial fibrillation.  Presyncopal episode was thought to be possible related to postural hypotension.  HCTZ was stopped and he has had no further dizziness.    Due to breakthrough atrial fibrillation, he was transitioned from sotalol to flecainide.  He now does not note palpitations or flutters.   He has been doing well. I reviewed his LINQ monitor readings, no atrial fibrillation.  He is walking for exercise.  No significant exertional dyspnea, no chest pain.  No palpitations, no orthopnea/PND.  No BRBPR/melena.     Labs (10/19): K 3.9, creatinine 0.72, hgb 13.5 Labs (1/20): K 4.3, creatinine 0.81 Labs  (5/20): LDL 58, HDL 51  ECG (personally reviewed): NSR, LVH  Current Outpatient Medications  Medication Sig Dispense Refill  . amLODipine (NORVASC) 10 MG tablet TAKE 1 TABLET(10 MG) BY MOUTH DAILY 90 tablet 3  . atorvastatin (LIPITOR) 10 MG tablet TAKE 1/2 TABLET(5 MG) BY MOUTH DAILY 45 tablet 3  . ELIQUIS 5 MG TABS tablet TAKE 1 TABLET(5 MG) BY MOUTH TWICE DAILY 60 tablet 10  . flecainide (TAMBOCOR) 50 MG tablet TAKE 1 TABLET BY MOUTH TWICE DAILY 60 tablet 3  . fluticasone (FLONASE) 50 MCG/ACT nasal spray Place 1 spray into the nose daily as needed.     Marland Kitchen ibuprofen (ADVIL,MOTRIN) 200 MG tablet Take 200 mg by mouth every 6 (six) hours as needed (pain).     Marland Kitchen lisinopril (PRINIVIL,ZESTRIL) 40 MG tablet TAKE 1 TABLET(40 MG) BY MOUTH DAILY 90 tablet 3  . loratadine (CLARITIN) 10 MG tablet Take 10 mg by mouth daily as needed for allergies.    . metFORMIN (GLUCOPHAGE-XR) 500 MG 24 hr tablet Take 2,000 mg by mouth daily with breakfast.    . metoprolol succinate (TOPROL-XL) 50 MG 24 hr tablet Take 1 tablet (50 mg total) by mouth daily. Take with or immediately following a meal. 30 tablet 3  . ONE TOUCH ULTRA TEST test strip CHECK BLOOD SUGAR ONCE D  5  . Semaglutide,0.25 or  0.5MG /DOS, (OZEMPIC, 0.25 OR 0.5 MG/DOSE,) 2 MG/1.5ML SOPN Inject 1.8 mg into the skin every 14 (fourteen) days.     . sildenafil (REVATIO) 20 MG tablet Take 80 mg by mouth as needed (ED). Take as directed     No current facility-administered medications for this encounter.     Allergies:   Penicillins   Social History:  The patient  reports that he is a non-smoker but has been exposed to tobacco smoke. He has never used smokeless tobacco. He reports current alcohol use. He reports that he does not use drugs.   Family History:  The patient's family history includes Diabetes in his mother and unknown relative; Heart disease in his maternal aunt; Hypertension in his father; Other in his father; Stroke in his father.   ROS:   Please see the history of present illness.   All other systems are personally reviewed and negative.   Exam:  BP 131/76   Pulse 71   Wt 105.5 kg (232 lb 9.6 oz)   SpO2 100%   BMI 31.55 kg/m  General: NAD Neck: No JVD, no thyromegaly or thyroid nodule.  Lungs: Clear to auscultation bilaterally with normal respiratory effort. CV: Nondisplaced PMI.  Heart regular S1/S2, no S3/S4, no murmur.  No peripheral edema.  No carotid bruit.  Normal pedal pulses.  Abdomen: Soft, nontender, no hepatosplenomegaly, no distention.  Skin: Intact without lesions or rashes.  Neurologic: Alert and oriented x 3.  Psych: Normal affect. Extremities: No clubbing or cyanosis.  HEENT: Normal.   Recent Labs: 10/21/2019: BUN 16; Creatinine, Ser 0.79; Hemoglobin 13.8; Platelets 294; Potassium 3.5; Sodium 139  Personally reviewed   Wt Readings from Last 3 Encounters:  10/21/19 105.5 kg (232 lb 9.6 oz)  08/31/18 107.1 kg (236 lb 2 oz)  02/12/18 106.5 kg (234 lb 12 oz)     ASSESSMENT AND PLAN:  1. Paroxysmal atrial fibrillation:  Multiple atrial fibrillation risk factors including HTN, diabetes, and OSA.  No recent atrial fibrillation by interrogation of LINQ monitor. Flecainide seems to be controlling atrial fibrillation better than sotalol.  ETT on flecainide in 3/19 looked ok.  - Continue flecainide.  - Continue Toprol XL 50 mg daily.  - Continue Eliquis, check CBC.  - If he develops breakthrough atrial fibrillation, ablation would be an option.    2. HTN: BP is controlled.  3. Hyperlipidemia: Good lipids in 5/20.   4. OSA: Continue to use CPAP.   5. Obesity: Continue to work on weight loss.    Followup in 6 months.   Signed, Loralie Champagne, MD  10/21/2019  [facility] [address] [facility] [place] [address] [phone] (office) [phone] (fax)

## 2019-10-28 ENCOUNTER — Other Ambulatory Visit (HOSPITAL_COMMUNITY): Payer: Self-pay

## 2019-10-28 ENCOUNTER — Ambulatory Visit (INDEPENDENT_AMBULATORY_CARE_PROVIDER_SITE_OTHER): Payer: 59 | Admitting: *Deleted

## 2019-10-28 DIAGNOSIS — I480 Paroxysmal atrial fibrillation: Secondary | ICD-10-CM | POA: Diagnosis not present

## 2019-10-28 LAB — CUP PACEART REMOTE DEVICE CHECK
Date Time Interrogation Session: 20201129131134
Implantable Pulse Generator Implant Date: 20171013

## 2019-10-28 MED ORDER — LISINOPRIL 40 MG PO TABS: ORAL_TABLET | ORAL | 3 refills | Status: DC

## 2019-11-10 ENCOUNTER — Encounter (HOSPITAL_BASED_OUTPATIENT_CLINIC_OR_DEPARTMENT_OTHER): Payer: Self-pay | Admitting: Emergency Medicine

## 2019-11-10 ENCOUNTER — Emergency Department (HOSPITAL_BASED_OUTPATIENT_CLINIC_OR_DEPARTMENT_OTHER): Payer: 59

## 2019-11-10 ENCOUNTER — Other Ambulatory Visit: Payer: Self-pay

## 2019-11-10 ENCOUNTER — Inpatient Hospital Stay (HOSPITAL_BASED_OUTPATIENT_CLINIC_OR_DEPARTMENT_OTHER)
Admission: EM | Admit: 2019-11-10 | Discharge: 2019-11-15 | DRG: 177 | Disposition: A | Discharge status: Home / Self Care | Payer: 59 | Attending: Internal Medicine | Admitting: Internal Medicine

## 2019-11-10 DIAGNOSIS — J1289 Other viral pneumonia: Secondary | ICD-10-CM | POA: Diagnosis present

## 2019-11-10 DIAGNOSIS — Z7984 Long term (current) use of oral hypoglycemic drugs: Secondary | ICD-10-CM | POA: Diagnosis not present

## 2019-11-10 DIAGNOSIS — J9601 Acute respiratory failure with hypoxia: Secondary | ICD-10-CM | POA: Diagnosis present

## 2019-11-10 DIAGNOSIS — R509 Fever, unspecified: Secondary | ICD-10-CM | POA: Diagnosis present

## 2019-11-10 DIAGNOSIS — E785 Hyperlipidemia, unspecified: Secondary | ICD-10-CM | POA: Diagnosis present

## 2019-11-10 DIAGNOSIS — Z20822 Contact with and (suspected) exposure to covid-19: Secondary | ICD-10-CM

## 2019-11-10 DIAGNOSIS — Z7901 Long term (current) use of anticoagulants: Secondary | ICD-10-CM | POA: Diagnosis not present

## 2019-11-10 DIAGNOSIS — E118 Type 2 diabetes mellitus with unspecified complications: Secondary | ICD-10-CM | POA: Diagnosis not present

## 2019-11-10 DIAGNOSIS — Z833 Family history of diabetes mellitus: Secondary | ICD-10-CM

## 2019-11-10 DIAGNOSIS — E1165 Type 2 diabetes mellitus with hyperglycemia: Secondary | ICD-10-CM | POA: Diagnosis present

## 2019-11-10 DIAGNOSIS — R0602 Shortness of breath: Secondary | ICD-10-CM

## 2019-11-10 DIAGNOSIS — E7800 Pure hypercholesterolemia, unspecified: Secondary | ICD-10-CM | POA: Diagnosis not present

## 2019-11-10 DIAGNOSIS — E11319 Type 2 diabetes mellitus with unspecified diabetic retinopathy without macular edema: Secondary | ICD-10-CM | POA: Diagnosis present

## 2019-11-10 DIAGNOSIS — I10 Essential (primary) hypertension: Secondary | ICD-10-CM | POA: Diagnosis present

## 2019-11-10 DIAGNOSIS — Z79899 Other long term (current) drug therapy: Secondary | ICD-10-CM | POA: Diagnosis not present

## 2019-11-10 DIAGNOSIS — Z880 Allergy status to penicillin: Secondary | ICD-10-CM

## 2019-11-10 DIAGNOSIS — I480 Paroxysmal atrial fibrillation: Secondary | ICD-10-CM | POA: Diagnosis present

## 2019-11-10 DIAGNOSIS — E119 Type 2 diabetes mellitus without complications: Secondary | ICD-10-CM | POA: Diagnosis present

## 2019-11-10 DIAGNOSIS — E669 Obesity, unspecified: Secondary | ICD-10-CM | POA: Diagnosis present

## 2019-11-10 DIAGNOSIS — G4733 Obstructive sleep apnea (adult) (pediatric): Secondary | ICD-10-CM | POA: Diagnosis present

## 2019-11-10 DIAGNOSIS — Y92230 Patient room in hospital as the place of occurrence of the external cause: Secondary | ICD-10-CM | POA: Diagnosis present

## 2019-11-10 DIAGNOSIS — E876 Hypokalemia: Secondary | ICD-10-CM | POA: Diagnosis present

## 2019-11-10 DIAGNOSIS — IMO0002 Reserved for concepts with insufficient information to code with codable children: Secondary | ICD-10-CM | POA: Diagnosis present

## 2019-11-10 DIAGNOSIS — T380X5A Adverse effect of glucocorticoids and synthetic analogues, initial encounter: Secondary | ICD-10-CM | POA: Diagnosis present

## 2019-11-10 DIAGNOSIS — E113299 Type 2 diabetes mellitus with mild nonproliferative diabetic retinopathy without macular edema, unspecified eye: Secondary | ICD-10-CM | POA: Diagnosis present

## 2019-11-10 DIAGNOSIS — Z8249 Family history of ischemic heart disease and other diseases of the circulatory system: Secondary | ICD-10-CM | POA: Diagnosis not present

## 2019-11-10 DIAGNOSIS — U071 COVID-19: Secondary | ICD-10-CM | POA: Diagnosis present

## 2019-11-10 DIAGNOSIS — J129 Viral pneumonia, unspecified: Secondary | ICD-10-CM

## 2019-11-10 DIAGNOSIS — E1159 Type 2 diabetes mellitus with other circulatory complications: Secondary | ICD-10-CM | POA: Diagnosis not present

## 2019-11-10 DIAGNOSIS — E08311 Diabetes mellitus due to underlying condition with unspecified diabetic retinopathy with macular edema: Secondary | ICD-10-CM | POA: Diagnosis not present

## 2019-11-10 DIAGNOSIS — R197 Diarrhea, unspecified: Secondary | ICD-10-CM | POA: Diagnosis present

## 2019-11-10 DIAGNOSIS — E1169 Type 2 diabetes mellitus with other specified complication: Secondary | ICD-10-CM | POA: Diagnosis not present

## 2019-11-10 LAB — COMPREHENSIVE METABOLIC PANEL
ALT: 25 U/L (ref 0–44)
AST: 34 U/L (ref 15–41)
Albumin: 3.3 g/dL — ABNORMAL LOW (ref 3.5–5.0)
Alkaline Phosphatase: 48 U/L (ref 38–126)
Anion gap: 13 (ref 5–15)
BUN: 15 mg/dL (ref 8–23)
CO2: 22 mmol/L (ref 22–32)
Calcium: 8.4 mg/dL — ABNORMAL LOW (ref 8.9–10.3)
Chloride: 104 mmol/L (ref 98–111)
Creatinine, Ser: 0.79 mg/dL (ref 0.61–1.24)
GFR calc Af Amer: >60 mL/min (ref >60)
GFR calc non Af Amer: >60 mL/min (ref >60)
Glucose, Bld: 124 mg/dL — ABNORMAL HIGH (ref 70–99)
Potassium: 3 mmol/L — ABNORMAL LOW (ref 3.5–5.1)
Sodium: 139 mmol/L (ref 135–145)
Total Bilirubin: 0.8 mg/dL (ref 0.3–1.2)
Total Protein: 7 g/dL (ref 6.5–8.1)

## 2019-11-10 LAB — LACTATE DEHYDROGENASE: LDH: 247 U/L — ABNORMAL HIGH (ref 98–192)

## 2019-11-10 LAB — CBC WITH DIFFERENTIAL/PLATELET
Abs Immature Granulocytes: 0.03 10*3/uL (ref 0.00–0.07)
Basophils Absolute: 0 10*3/uL (ref 0.0–0.1)
Basophils Relative: 0 %
Eosinophils Absolute: 0 10*3/uL (ref 0.0–0.5)
Eosinophils Relative: 0 %
HCT: 38.6 % — ABNORMAL LOW (ref 39.0–52.0)
Hemoglobin: 13.1 g/dL (ref 13.0–17.0)
Immature Granulocytes: 0 %
Lymphocytes Relative: 12 %
Lymphs Abs: 0.9 10*3/uL (ref 0.7–4.0)
MCH: 31 pg (ref 26.0–34.0)
MCHC: 33.9 g/dL (ref 30.0–36.0)
MCV: 91.3 fL (ref 80.0–100.0)
Monocytes Absolute: 0.3 10*3/uL (ref 0.1–1.0)
Monocytes Relative: 4 %
Neutro Abs: 6.1 10*3/uL (ref 1.7–7.7)
Neutrophils Relative %: 84 %
Platelets: 286 10*3/uL (ref 150–400)
RBC: 4.23 MIL/uL (ref 4.22–5.81)
RDW: 13 % (ref 11.5–15.5)
WBC: 7.3 10*3/uL (ref 4.0–10.5)
nRBC: 0 % (ref 0.0–0.2)

## 2019-11-10 LAB — FIBRINOGEN: Fibrinogen: 626 mg/dL — ABNORMAL HIGH (ref 210–475)

## 2019-11-10 LAB — LACTIC ACID, PLASMA: Lactic Acid, Venous: 1.6 mmol/L (ref 0.5–1.9)

## 2019-11-10 LAB — TRIGLYCERIDES: Triglycerides: 83 mg/dL (ref <150)

## 2019-11-10 LAB — SARS CORONAVIRUS 2 AG (30 MIN TAT): SARS Coronavirus 2 Ag: NEGATIVE

## 2019-11-10 LAB — C-REACTIVE PROTEIN: CRP: 11.2 mg/dL — ABNORMAL HIGH (ref <1.0)

## 2019-11-10 LAB — PROCALCITONIN: Procalcitonin: <0.1 ng/mL

## 2019-11-10 LAB — FERRITIN: Ferritin: 275 ng/mL (ref 24–336)

## 2019-11-10 LAB — D-DIMER, QUANTITATIVE: D-Dimer, Quant: 0.33 ug/mL-FEU (ref 0.00–0.50)

## 2019-11-10 MED ORDER — ACETAMINOPHEN 325 MG PO TABS
650.0000 mg | ORAL_TABLET | Freq: Four times a day (QID) | ORAL | Status: DC | PRN
  Administered 2019-11-10: 21:00:00 650 mg via ORAL
  Filled 2019-11-10: qty 2

## 2019-11-10 MED ORDER — POTASSIUM CHLORIDE CRYS ER 20 MEQ PO TBCR
40.0000 meq | EXTENDED_RELEASE_TABLET | Freq: Once | ORAL | Status: AC
  Administered 2019-11-10: 16:00:00 40 meq via ORAL
  Filled 2019-11-10: qty 2

## 2019-11-10 MED ORDER — ACETAMINOPHEN 500 MG PO TABS
1000.0000 mg | ORAL_TABLET | Freq: Once | ORAL | Status: AC
  Administered 2019-11-10: 14:00:00 1000 mg via ORAL
  Filled 2019-11-10: qty 2

## 2019-11-10 MED ORDER — IBUPROFEN 400 MG PO TABS
600.0000 mg | ORAL_TABLET | Freq: Three times a day (TID) | ORAL | Status: DC | PRN
  Administered 2019-11-10: 21:00:00 600 mg via ORAL
  Filled 2019-11-10: qty 1

## 2019-11-10 MED ORDER — DEXAMETHASONE SODIUM PHOSPHATE 10 MG/ML IJ SOLN
10.0000 mg | Freq: Once | INTRAMUSCULAR | Status: AC
  Administered 2019-11-10: 23:00:00 10 mg via INTRAVENOUS
  Filled 2019-11-10: qty 1

## 2019-11-10 NOTE — ED Provider Notes (Signed)
[facility] EMERGENCY DEPARTMENT Provider Note   CSN: [id number] Arrival date & time: 11/10/19  1221     History Chief Complaint  Patient presents with  . Cough    George Carr is a 63 y.o. male history of A. fib, DM, HTN, OSA  HPI  Patient presents for cough, fever, shortness of breath and fatigue with congestion for the past 4 days.  Patient reports he was exposed to Covid.  He was tested several days ago and found to be negative.  Saw telemedicine provider 2 days ago and was prescribed benzonatate and Mucinex.  He has been using these 2 medications without significant relief.  Has not taken any Tylenol ibuprofen today.  Denies any other medication use.  States he feels like a dump truck hit him.  Denies any chest pain other than when he coughs.  States he is able to get around his house lives with his wife and has good support.  She states that he would like some symptom relief.  Patient has obstructive sleep apnea uses CPAP at night with oxygen at baseline.  Denies any apneic episodes or increased work of breathing at night.  States that he sleeps well.  Patient states he has had decreased activity since symptoms began due to his fatigue.  Patient states that he is able to walk around without feeling lightheaded or dizzy.  States he has been eating and drinking normally.  Normal bowel movements other than episodes of diarrhea occasionally.  Patient denies any hemoptysis.  States that he is cough and other symptoms came on gradually.     Past Medical History:  Diagnosis Date  . Atrial fibrillation (HCC)   . Essential hypertension, benign   . 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 Obesity, unspecified   . Personal history of other specified diseases(V13.89)   . Pure hyperglyceridemia   . Type II or unspecified type diabetes mellitus without mention of complication, not stated as uncontrolled     Patient Active Problem List   Diagnosis Date Noted   . Acute respiratory failure with hypoxia (HCC) 11/10/2019  . Chronic anticoagulation 07/25/2014  . Pre-syncope 07/25/2014  . Paroxysmal atrial fibrillation (HCC) 05/30/2014  . Essential hypertension 05/30/2014  . Hyperlipidemia 05/30/2014  . OSA (obstructive sleep apnea) 05/30/2014    Past Surgical History:  Procedure Laterality Date  . cornea radial keratotomy Bilateral 2005  . EP IMPLANTABLE DEVICE N/A 09/09/2016   Procedure: Loop Recorder Insertion;  Surgeon: Hillis Range, MD;  Location: [facility] INVASIVE CV LAB;  Service: Cardiovascular;  Laterality: N/A;  . KNEE ARTHROTOMY Right 1978  . NASAL SEPTOPLASTY W/ TURBINOPLASTY  1999       Family History  Problem Relation Age of Onset  . Hypertension Father   . Other Father        pat/mat uncles, malignant neoplasm of the prostate gland  . Stroke Father   . Diabetes Mother   . Heart disease Maternal Aunt   . Diabetes Other     Social History   Tobacco Use  . Smoking status: Passive Smoke Exposure - Never Smoker  . Smokeless tobacco: Never Used  Substance Use Topics  . Alcohol use: Yes    Comment: seldom  . Drug use: No    Home Medications Prior to Admission medications   Medication Sig Start Date End Date Taking? Authorizing Provider  amLODipine (NORVASC) 10 MG tablet TAKE 1 TABLET(10 MG) BY MOUTH DAILY 08/26/19   Laurey Morale, MD  atorvastatin (LIPITOR) 10 MG tablet TAKE 1/2 TABLET(5 MG) BY MOUTH DAILY 08/19/19   Laurey Morale, MD  ELIQUIS 5 MG TABS tablet TAKE 1 TABLET(5 MG) BY MOUTH TWICE DAILY 11/12/18   Allred, Fayrene Fearing, MD  flecainide (TAMBOCOR) 50 MG tablet TAKE 1 TABLET BY MOUTH TWICE DAILY 08/26/19   Laurey Morale, MD  fluticasone [facility]) 50 MCG/ACT nasal spray Place 1 spray into the nose daily as needed.  12/20/18   [provider]  ibuprofen (ADVIL,MOTRIN) 200 MG tablet Take 200 mg by mouth every 6 (six) hours as needed (pain).     [provider]  lisinopril (ZESTRIL) 40 MG tablet TAKE 1  TABLET(40 MG) BY MOUTH DAILY 10/28/19   Laurey Morale, MD  loratadine (CLARITIN) 10 MG tablet Take 10 mg by mouth daily as needed for allergies.    [provider]  metFORMIN (GLUCOPHAGE-XR) 500 MG 24 hr tablet Take 2,000 mg by mouth daily with breakfast.    [provider]  metoprolol succinate (TOPROL-XL) 50 MG 24 hr tablet Take 1 tablet (50 mg total) by mouth daily. Take with or immediately following a meal. 08/27/19   Laurey Morale, MD  ONE TOUCH ULTRA TEST test strip CHECK BLOOD SUGAR ONCE D 05/28/15   [provider]  Semaglutide,0.25 or 0.5MG /DOS, (OZEMPIC, 0.25 OR 0.5 MG/DOSE,) 2 MG/1.5ML SOPN Inject 1.8 mg into the skin every 14 (fourteen) days.  10/16/19   [provider]  sildenafil (REVATIO) 20 MG tablet Take 80 mg by mouth as needed (ED). Take as directed 05/03/16   [provider]    Allergies    Penicillins  Review of Systems   Review of Systems  Constitutional: Positive for chills, fatigue and fever.  HENT: Positive for congestion.   Respiratory: Positive for cough and shortness of breath.   Cardiovascular: Positive for chest pain.  Gastrointestinal: Negative for abdominal pain.  Musculoskeletal: Negative for neck pain.  Skin: Negative for rash.  Allergic/Immunologic: Negative for immunocompromised state.  Neurological: Negative for dizziness, light-headedness and headaches.    Physical Exam Updated Vital Signs BP 117/78 (BP Location: Left Arm)   Pulse 87 Comment: Placed on 2l/m  Temp 100.3 F (37.9 C) (Oral)   Resp 19 Comment: Placed on 2l/m  Ht 6' (1.829 m)   Wt 104.3 kg   SpO2 95% Comment: Placed on 2l/m  BMI 31.19 kg/m   Physical Exam Vitals and nursing note reviewed.  Constitutional:      General: He is not in acute distress.    Comments: Patient is well-appearing 63 year old male appears stated age.  Because currently during examination but does not have any increased work of breathing speaking in full  sentences and is not tachypneic.  HENT:     Head: Normocephalic and atraumatic.     Nose: Nose normal.  Eyes:     General: No scleral icterus. Cardiovascular:     Rate and Rhythm: Normal rate and regular rhythm.     Pulses: Normal pulses.     Heart sounds: Normal heart sounds.     Comments: Pulse was 90 on my exam Pulmonary:     Effort: Pulmonary effort is normal. No respiratory distress.     Breath sounds: No wheezing.     Comments: Pulse oximetry 94-95% on room air during physical exam  No increased work of breathing.  Frequent coughs. No wheezes rhonchi or rales.  No chest wall tenderness. Abdominal:     Palpations: Abdomen is soft.  Tenderness: There is no abdominal tenderness.  Musculoskeletal:     Cervical back: Normal range of motion.     Right lower leg: No edema.     Left lower leg: No edema.  Skin:    General: Skin is warm and dry.     Capillary Refill: Capillary refill takes less than 2 seconds.  Neurological:     Mental Status: He is alert. Mental status is at baseline.  Psychiatric:        Mood and Affect: Mood normal.        Behavior: Behavior normal.     ED Results / Procedures / Treatments   Labs (all labs ordered are listed, but only abnormal results are displayed) Labs Reviewed  CBC WITH DIFFERENTIAL/PLATELET - Abnormal; Notable for the following components:      Result Value   HCT 38.6 (*)    All other components within normal limits  COMPREHENSIVE METABOLIC PANEL - Abnormal; Notable for the following components:   Potassium 3.0 (*)    Glucose, Bld 124 (*)    Calcium 8.4 (*)    Albumin 3.3 (*)    All other components within normal limits  SARS CORONAVIRUS 2 AG (30 MIN TAT)  SARS CORONAVIRUS 2 (TAT 6-24 HRS)  CULTURE, BLOOD (ROUTINE X 2)  CULTURE, BLOOD (ROUTINE X 2)  LACTIC ACID, PLASMA  D-DIMER, QUANTITATIVE (NOT AT [facility])  LACTIC ACID, PLASMA  PROCALCITONIN  LACTATE DEHYDROGENASE  FERRITIN  TRIGLYCERIDES  FIBRINOGEN  C-REACTIVE  PROTEIN    EKG None  Radiology DG Chest Portable 1 View  Result Date: 11/10/2019 CLINICAL DATA:  Short of breath for 1 week with fever. Exposure to COVID 1910 days ago. EXAM: PORTABLE CHEST 1 VIEW COMPARISON:  None. FINDINGS: There are hazy airspace opacities which are noted in the peripheral left mid to upper and lower lung, and in the right mid and lower lung. No pleural effusion. No pneumothorax. Cardiac silhouette is normal in size. No mediastinal or hilar masses. Skeletal structures are grossly intact. IMPRESSION: 1. Bilateral hazy mid to lower lung peripheral airspace opacities, greater on the left, with a pattern consistent with COVID-19 pneumonia. Electronically Signed   By: Lajean Manes M.D.   On: 11/10/2019 13:57    Procedures Procedures (including critical care time) CRITICAL CARE Performed by: Tedd Sias   Total critical care time: 32 minutes  Critical care time was exclusive of separately billable procedures and treating other patients.  Critical care was necessary to treat or prevent imminent or life-threatening deterioration.  Critical care was time spent personally by me on the following activities: development of treatment plan with patient and/or surrogate as well as nursing, discussions with consultants, evaluation of patient's response to treatment, examination of patient, obtaining history from patient or surrogate, ordering and performing treatments and interventions, ordering and review of laboratory studies, ordering and review of radiographic studies, pulse oximetry and re-evaluation of patient's condition.  Patient has acute hypoxic respiratory failure likely secondary to viral illness patient has PUI for COVID-19 test positive for antigen but with 6-24-hour test pending.  Requiring new oxygen when she has not read in the past.  With bilateral opacities on chest x-ray.  Patient requires inpatient treatment.    Medications Ordered in ED Medications   acetaminophen (TYLENOL) tablet 1,000 mg (1,000 mg Oral Given 11/10/19 1356)  potassium chloride SA (KLOR-CON) CR tablet 40 mEq (40 mEq Oral Given 11/10/19 1612)    ED Course  I have reviewed the triage vital  signs and the nursing notes.  Pertinent labs & imaging results that were available during my care of the patient were reviewed by me and considered in my medical decision making (see chart for details).    MDM Rules/Calculators/A&P  Patient is 63 year old male with history of hypertension, OSA, HLD, DM tested for Covid recently found to be negative.  Has had progressive shortness of breath fatigue fever cough and body aches over the past 3 to 4 days.  Patient states he has known Covid exposure but with negative test not any 11th 70 was negative.  In ED patient was found to be acutely hypoxic resting SPO2 approximately 90 over dropped to 84% when ambulating on room air.  Has no obvious increased work of breathing on exam however does become more short of breath with ambulating.  Patient has been using benzonatate and cough syrup at home for symptomatic control.  Does use CPAP machine which she has been consistently using.  Does not wear oxygen during the day at home.  Patient has low-grade fever 100.3 in ED.  Was given Tylenol 1 g.  Chest x-ray, Covid antibody, CBC, CMP, Covid.  Preadmission labs ordered.  Suspect that this is hypoxic respiratory failure secondary to COVID-19.  Patient with no acute abnormalities on CBC, CMP notable for mild hypokalemia.  Will replete orally with 40 mEq.  Lactic within normal limits.  D-dimer negative. Tylenol given for elevated temperature and body aches.  Chest x-ray consistent with viral pneumonia with diffuse bilateral opacities.  No acute infiltrate.  Has no localizing abnormal lung sounds or evidence of pneumonia on my exam will not start empiric antibiotics.  No evidence of CHF on my exam including no JVD, no peripheral edema and no pulmonary edema  on chest x-ray.  Doubt pneumothorax as patient has no chest pain and no diminished lung sounds in apices bilaterally.  Doubt pulmonary embolism as patient has no risk factors of such and this is been more gradual and slow onset of shortness of breath.  Additionally, dimer ordered as part of inflammatory marker set is within normal limits.  I discussed this case with my attending physician who cosigned this note including patient's presenting symptoms, physical exam, and planned diagnostics and interventions. Attending physician stated agreement with plan or made changes to plan which were implemented.   Attending physician assessed patient at bedside.  Discussed case with Dr. Jacqulyn BathPahwani who [facility] and as PUI for COVID-19 and for new acute respiratory failure.    Final Clinical Impression(s) / ED Diagnoses Final diagnoses:  Suspected COVID-19 virus infection  Viral pneumonia  Elevated temperature  Acute respiratory failure with hypoxia [facility](HCC)     Rx / DC Orders ED Discharge Orders    None       Gailen ShelterFondaw, Shloime Keilman S, [place] 11/10/19 1810    Cathren LaineSteinl, Kevin, MD 11/11/19 1343

## 2019-11-10 NOTE — ED Triage Notes (Signed)
Pt c/o cough, fever, sob, with fatigue x 4 days. Pt reports exposure to Covid, negative results. Pt was prescribed meds on Friday for current symptoms

## 2019-11-11 ENCOUNTER — Encounter (HOSPITAL_COMMUNITY): Payer: Self-pay | Admitting: Internal Medicine

## 2019-11-11 ENCOUNTER — Inpatient Hospital Stay (HOSPITAL_COMMUNITY): Payer: 59

## 2019-11-11 DIAGNOSIS — E669 Obesity, unspecified: Secondary | ICD-10-CM | POA: Diagnosis present

## 2019-11-11 DIAGNOSIS — J1289 Other viral pneumonia: Secondary | ICD-10-CM | POA: Diagnosis present

## 2019-11-11 DIAGNOSIS — E785 Hyperlipidemia, unspecified: Secondary | ICD-10-CM | POA: Diagnosis present

## 2019-11-11 DIAGNOSIS — IMO0002 Reserved for concepts with insufficient information to code with codable children: Secondary | ICD-10-CM | POA: Diagnosis present

## 2019-11-11 DIAGNOSIS — U071 COVID-19: Secondary | ICD-10-CM | POA: Diagnosis present

## 2019-11-11 DIAGNOSIS — I480 Paroxysmal atrial fibrillation: Secondary | ICD-10-CM | POA: Diagnosis present

## 2019-11-11 DIAGNOSIS — E11319 Type 2 diabetes mellitus with unspecified diabetic retinopathy without macular edema: Secondary | ICD-10-CM | POA: Diagnosis present

## 2019-11-11 DIAGNOSIS — E1165 Type 2 diabetes mellitus with hyperglycemia: Secondary | ICD-10-CM | POA: Diagnosis present

## 2019-11-11 DIAGNOSIS — I10 Essential (primary) hypertension: Secondary | ICD-10-CM | POA: Diagnosis present

## 2019-11-11 DIAGNOSIS — E08311 Diabetes mellitus due to underlying condition with unspecified diabetic retinopathy with macular edema: Secondary | ICD-10-CM

## 2019-11-11 LAB — CBC WITH DIFFERENTIAL/PLATELET
Abs Immature Granulocytes: 0.02 10*3/uL (ref 0.00–0.07)
Basophils Absolute: 0 10*3/uL (ref 0.0–0.1)
Basophils Relative: 0 %
Eosinophils Absolute: 0 10*3/uL (ref 0.0–0.5)
Eosinophils Relative: 0 %
HCT: 41.6 % (ref 39.0–52.0)
Hemoglobin: 14 g/dL (ref 13.0–17.0)
Immature Granulocytes: 0 %
Lymphocytes Relative: 8 %
Lymphs Abs: 0.5 10*3/uL — ABNORMAL LOW (ref 0.7–4.0)
MCH: 30.6 pg (ref 26.0–34.0)
MCHC: 33.7 g/dL (ref 30.0–36.0)
MCV: 90.8 fL (ref 80.0–100.0)
Monocytes Absolute: 0.2 10*3/uL (ref 0.1–1.0)
Monocytes Relative: 2 %
Neutro Abs: 6 10*3/uL (ref 1.7–7.7)
Neutrophils Relative %: 90 %
Platelets: 333 10*3/uL (ref 150–400)
RBC: 4.58 MIL/uL (ref 4.22–5.81)
RDW: 12.8 % (ref 11.5–15.5)
WBC: 6.7 10*3/uL (ref 4.0–10.5)
nRBC: 0 % (ref 0.0–0.2)

## 2019-11-11 LAB — COMPREHENSIVE METABOLIC PANEL
ALT: 31 U/L (ref 0–44)
AST: 37 U/L (ref 15–41)
Albumin: 3.5 g/dL (ref 3.5–5.0)
Alkaline Phosphatase: 51 U/L (ref 38–126)
Anion gap: 14 (ref 5–15)
BUN: 17 mg/dL (ref 8–23)
CO2: 23 mmol/L (ref 22–32)
Calcium: 9.2 mg/dL (ref 8.9–10.3)
Chloride: 106 mmol/L (ref 98–111)
Creatinine, Ser: 0.82 mg/dL (ref 0.61–1.24)
GFR calc Af Amer: >60 mL/min (ref >60)
GFR calc non Af Amer: >60 mL/min (ref >60)
Glucose, Bld: 211 mg/dL — ABNORMAL HIGH (ref 70–99)
Potassium: 3.6 mmol/L (ref 3.5–5.1)
Sodium: 143 mmol/L (ref 135–145)
Total Bilirubin: 0.6 mg/dL (ref 0.3–1.2)
Total Protein: 7.7 g/dL (ref 6.5–8.1)

## 2019-11-11 LAB — GLUCOSE, CAPILLARY
Glucose-Capillary: 196 mg/dL — ABNORMAL HIGH (ref 70–99)
Glucose-Capillary: 252 mg/dL — ABNORMAL HIGH (ref 70–99)
Glucose-Capillary: 258 mg/dL — ABNORMAL HIGH (ref 70–99)

## 2019-11-11 LAB — HEPATITIS B SURFACE ANTIGEN: Hepatitis B Surface Ag: NONREACTIVE

## 2019-11-11 LAB — D-DIMER, QUANTITATIVE: D-Dimer, Quant: 0.46 ug/mL-FEU (ref 0.00–0.50)

## 2019-11-11 LAB — HIV ANTIBODY (ROUTINE TESTING W REFLEX): HIV Screen 4th Generation wRfx: NONREACTIVE

## 2019-11-11 LAB — TROPONIN I (HIGH SENSITIVITY)
Troponin I (High Sensitivity): 4 ng/L (ref <18)
Troponin I (High Sensitivity): 4 ng/L (ref <18)

## 2019-11-11 LAB — BRAIN NATRIURETIC PEPTIDE: B Natriuretic Peptide: 61 pg/mL (ref 0.0–100.0)

## 2019-11-11 LAB — LIPID PANEL
Cholesterol: 97 mg/dL (ref 0–200)
HDL: 37 mg/dL — ABNORMAL LOW (ref >40)
LDL Cholesterol: 45 mg/dL (ref 0–99)
Total CHOL/HDL Ratio: 2.6 RATIO
Triglycerides: 75 mg/dL (ref <150)
VLDL: 15 mg/dL (ref 0–40)

## 2019-11-11 LAB — FERRITIN: Ferritin: 297 ng/mL (ref 24–336)

## 2019-11-11 LAB — HEMOGLOBIN A1C
Hgb A1c MFr Bld: 6.4 % — ABNORMAL HIGH (ref 4.8–5.6)
Mean Plasma Glucose: 136.98 mg/dL

## 2019-11-11 LAB — ABO/RH: ABO/RH(D): A POS

## 2019-11-11 LAB — SARS CORONAVIRUS 2 (TAT 6-24 HRS): SARS Coronavirus 2: POSITIVE — AB

## 2019-11-11 LAB — CBG MONITORING, ED: Glucose-Capillary: 202 mg/dL — ABNORMAL HIGH (ref 70–99)

## 2019-11-11 LAB — PROCALCITONIN: Procalcitonin: <0.1 ng/mL

## 2019-11-11 LAB — C-REACTIVE PROTEIN: CRP: 13.4 mg/dL — ABNORMAL HIGH (ref <1.0)

## 2019-11-11 LAB — LACTATE DEHYDROGENASE: LDH: 268 U/L — ABNORMAL HIGH (ref 98–192)

## 2019-11-11 LAB — SARS CORONAVIRUS 2 BY RT PCR (HOSPITAL ORDER, PERFORMED IN ~~LOC~~ HOSPITAL LAB): SARS Coronavirus 2: POSITIVE — AB

## 2019-11-11 MED ORDER — TOCILIZUMAB 400 MG/20ML IV SOLN
800.0000 mg | Freq: Once | INTRAVENOUS | Status: AC
  Administered 2019-11-11: 17:00:00 800 mg via INTRAVENOUS
  Filled 2019-11-11: qty 40

## 2019-11-11 MED ORDER — ASCORBIC ACID 500 MG PO TABS
500.0000 mg | ORAL_TABLET | Freq: Every day | ORAL | Status: DC
  Administered 2019-11-11 – 2019-11-15 (×5): 500 mg via ORAL
  Filled 2019-11-11 (×5): qty 1

## 2019-11-11 MED ORDER — ASPIRIN EC 81 MG PO TBEC
81.0000 mg | DELAYED_RELEASE_TABLET | Freq: Every day | ORAL | Status: DC
  Administered 2019-11-11 – 2019-11-15 (×5): 81 mg via ORAL
  Filled 2019-11-11 (×5): qty 1

## 2019-11-11 MED ORDER — ONDANSETRON HCL 4 MG PO TABS: 4.0000 mg | ORAL_TABLET | Freq: Four times a day (QID) | ORAL | Status: DC | PRN

## 2019-11-11 MED ORDER — APIXABAN 5 MG PO TABS
5.0000 mg | ORAL_TABLET | Freq: Two times a day (BID) | ORAL | Status: DC
  Administered 2019-11-11 – 2019-11-15 (×9): 5 mg via ORAL
  Filled 2019-11-11 (×5): qty 1
  Filled 2019-11-11: qty 2
  Filled 2019-11-11 (×3): qty 1

## 2019-11-11 MED ORDER — INSULIN ASPART 100 UNIT/ML ~~LOC~~ SOLN
0.0000 [IU] | SUBCUTANEOUS | Status: DC
  Administered 2019-11-11: 18:00:00 8 [IU] via SUBCUTANEOUS
  Administered 2019-11-11 – 2019-11-12 (×2): 3 [IU] via SUBCUTANEOUS
  Administered 2019-11-12: 8 [IU] via SUBCUTANEOUS
  Administered 2019-11-12: 3 [IU] via SUBCUTANEOUS
  Administered 2019-11-12: 8 [IU] via SUBCUTANEOUS
  Administered 2019-11-12 (×2): 3 [IU] via SUBCUTANEOUS
  Administered 2019-11-13: 15 [IU] via SUBCUTANEOUS
  Administered 2019-11-13: 8 [IU] via SUBCUTANEOUS
  Administered 2019-11-13: 3 [IU] via SUBCUTANEOUS
  Administered 2019-11-14 (×2): 2 [IU] via SUBCUTANEOUS
  Administered 2019-11-14: 8 [IU] via SUBCUTANEOUS
  Administered 2019-11-14: 3 [IU] via SUBCUTANEOUS
  Administered 2019-11-14: 5 [IU] via SUBCUTANEOUS
  Administered 2019-11-14: 8 [IU] via SUBCUTANEOUS
  Administered 2019-11-15 (×2): 3 [IU] via SUBCUTANEOUS
  Administered 2019-11-15: 2 [IU] via SUBCUTANEOUS

## 2019-11-11 MED ORDER — FOLIC ACID 1 MG PO TABS
1.0000 mg | ORAL_TABLET | Freq: Every day | ORAL | Status: DC
  Administered 2019-11-11 – 2019-11-15 (×5): 1 mg via ORAL
  Filled 2019-11-11 (×5): qty 1

## 2019-11-11 MED ORDER — THIAMINE HCL 100 MG PO TABS
100.0000 mg | ORAL_TABLET | Freq: Every day | ORAL | Status: DC
  Administered 2019-11-11 – 2019-11-15 (×5): 100 mg via ORAL
  Filled 2019-11-11 (×5): qty 1

## 2019-11-11 MED ORDER — AMLODIPINE BESYLATE 10 MG PO TABS
10.0000 mg | ORAL_TABLET | Freq: Every day | ORAL | Status: DC
  Administered 2019-11-11 – 2019-11-15 (×5): 10 mg via ORAL
  Filled 2019-11-11: qty 2
  Filled 2019-11-11 (×4): qty 1

## 2019-11-11 MED ORDER — ATORVASTATIN CALCIUM 10 MG PO TABS
5.0000 mg | ORAL_TABLET | Freq: Every day | ORAL | Status: DC
  Administered 2019-11-11 – 2019-11-14 (×4): 5 mg via ORAL
  Filled 2019-11-11 (×5): qty 1

## 2019-11-11 MED ORDER — APIXABAN 2.5 MG PO TABS
ORAL_TABLET | ORAL | Status: AC
  Filled 2019-11-11: qty 1

## 2019-11-11 MED ORDER — DEXAMETHASONE SODIUM PHOSPHATE 10 MG/ML IJ SOLN
6.0000 mg | INTRAMUSCULAR | Status: DC
  Administered 2019-11-11 – 2019-11-15 (×5): 6 mg via INTRAVENOUS
  Filled 2019-11-11 (×4): qty 1

## 2019-11-11 MED ORDER — IPRATROPIUM-ALBUTEROL 20-100 MCG/ACT IN AERS
1.0000 | INHALATION_SPRAY | Freq: Four times a day (QID) | RESPIRATORY_TRACT | Status: DC
  Administered 2019-11-11 – 2019-11-15 (×13): 1 via RESPIRATORY_TRACT
  Filled 2019-11-11: qty 4

## 2019-11-11 MED ORDER — SODIUM CHLORIDE 0.9 % IV SOLN
100.0000 mg | Freq: Every day | INTRAVENOUS | Status: AC
  Administered 2019-11-12 – 2019-11-15 (×4): 100 mg via INTRAVENOUS
  Filled 2019-11-11 (×5): qty 20

## 2019-11-11 MED ORDER — SODIUM CHLORIDE 0.9 % IV SOLN
200.0000 mg | Freq: Once | INTRAVENOUS | Status: AC
  Administered 2019-11-11: 200 mg via INTRAVENOUS
  Filled 2019-11-11: qty 40

## 2019-11-11 MED ORDER — ZINC SULFATE 220 (50 ZN) MG PO CAPS
220.0000 mg | ORAL_CAPSULE | Freq: Every day | ORAL | Status: DC
  Administered 2019-11-11 – 2019-11-15 (×5): 220 mg via ORAL
  Filled 2019-11-11 (×5): qty 1

## 2019-11-11 MED ORDER — TRAMADOL HCL 50 MG PO TABS: 50.0000 mg | ORAL_TABLET | Freq: Four times a day (QID) | ORAL | Status: DC | PRN

## 2019-11-11 MED ORDER — ACETAMINOPHEN 325 MG PO TABS: 650.0000 mg | ORAL_TABLET | Freq: Four times a day (QID) | ORAL | Status: DC | PRN

## 2019-11-11 MED ORDER — ONDANSETRON HCL 4 MG/2ML IJ SOLN: 4.0000 mg | Freq: Four times a day (QID) | INTRAMUSCULAR | Status: DC | PRN

## 2019-11-11 MED ORDER — METOPROLOL SUCCINATE ER 25 MG PO TB24
50.0000 mg | ORAL_TABLET | Freq: Every day | ORAL | Status: DC
  Administered 2019-11-11 – 2019-11-15 (×5): 50 mg via ORAL
  Filled 2019-11-11 (×5): qty 2

## 2019-11-11 MED ORDER — FLECAINIDE ACETATE 50 MG PO TABS
50.0000 mg | ORAL_TABLET | Freq: Two times a day (BID) | ORAL | Status: DC
  Administered 2019-11-11 – 2019-11-15 (×9): 50 mg via ORAL
  Filled 2019-11-11 (×10): qty 1

## 2019-11-11 NOTE — H&P (Addendum)
[facility] History and Physical  DUSTY RACZKOWSKI ZOX:[id number] DOB: 01/08/1956 DOA: 11/10/2019  Referring physician:  PCP: Loyal Jacobson, MD   Chief Complaint: S OB/WOB  HPI: George Carr  63 y.o Hispanic male PMHx paroxysmal A. fib on Eliquis, diabetes type 2 uncontrolled with complication, diabetic retinopathy, essential HTN, HLD, obesity, OSA  HPI  Patient presents for cough, fever, shortness of breath and fatigue with congestion for the past 4 days.  Patient reports he was exposed to Covid.  He was tested several days ago and found to be negative.  Saw telemedicine provider 2 days ago and was prescribed benzonatate and Mucinex.  He has been using these 2 medications without significant relief.  Has not taken any Tylenol ibuprofen today.  Denies any other medication use.  States he feels like a dump truck hit him.  Denies any chest pain other than when he coughs.  States he is able to get around his house lives with his wife and has good support.  She states that he would like some symptom relief.  Patient has obstructive sleep apnea uses CPAP at night with oxygen at baseline.  Denies any apneic episodes or increased work of breathing at night.  States that he sleeps well.  Patient states he has had decreased activity since symptoms began due to his fatigue.  Patient states that he is able to walk around without feeling lightheaded or dizzy.  States he has been eating and drinking normally.  Normal bowel movements other than episodes of diarrhea occasionally.  Patient denies any hemoptysis.  States that he is cough and other symptoms came on gradually.  States no one at home sick.  Believes he contracted Covid from a Comptroller.   Review of Systems:  Constitutional:  No weight loss, night sweats, Fevers, chills, fatigue.  HEENT:  No headaches, Difficulty swallowing,Tooth/dental problems,Sore throat,  No sneezing, itching, ear ache, nasal congestion, post nasal  drip,  Cardio-vascular:  No chest pain, Orthopnea, PND, swelling in lower extremities, anasarca, dizziness, palpitations  GI:  No heartburn, indigestion, abdominal pain, nausea, vomiting, diarrhea, change in bowel habits, loss of appetite  Resp:  Positive shortness of breath with exertion or at rest. No excess mucus, no productive cough, Positive  non-productive cough, No coughing up of blood.No change in color of mucus.No wheezing.No chest wall deformity  Skin:  no rash or lesions.  GU:  no dysuria, change in color of urine, no urgency or frequency. No flank pain.  Musculoskeletal:  Positive joint pain or swelling. No decreased range of motion. No back pain.  Psych:  No change in mood or affect. No depression or anxiety. No memory loss.   Past Medical History:  Diagnosis Date  . Atrial fibrillation (HCC)   . Essential hypertension, benign   . 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 Obesity, unspecified   . Personal history of other specified diseases(V13.89)   . Pure hyperglyceridemia   . Type II or unspecified type diabetes mellitus without mention of complication, not stated as uncontrolled    Past Surgical History:  Procedure Laterality Date  . cornea radial keratotomy Bilateral 2005  . EP IMPLANTABLE DEVICE N/A 09/09/2016   Procedure: Loop Recorder Insertion;  Surgeon: Hillis Range, MD;  Location: [facility]ASIVE [facility];  Service: Cardiovascular;  Laterality: N/A;  . KNEE ARTHROTOMY Right 1978  . NASAL SEPTOPLASTY W/ TURBINOPLASTY  1999   Social History:  reports that he is a non-smoker but has been exposed to tobacco  smoke. He has never used smokeless tobacco. He reports current alcohol use. He reports that he does not use drugs.  Allergies  Allergen Reactions  . Penicillins Hives    Has patient had a PCN reaction causing immediate rash, facial/tongue/throat swelling, SOB or lightheadedness with hypotension: Yes Has patient had a PCN reaction causing severe  rash involving mucus membranes or skin necrosis: No Has patient had a PCN reaction that required hospitalization No Has patient had a PCN reaction occurring within the last 10 years: No If all of the above answers are "NO", then may proceed with Cephalosporin use.     Family History  Problem Relation Age of Onset  . Hypertension Father   . Other Father        pat/mat uncles, malignant neoplasm of the prostate gland  . Stroke Father   . Diabetes Mother   . Heart disease Maternal Aunt   . Diabetes Other     Prior to Admission medications   Medication Sig Start Date End Date Taking? Authorizing Provider  amLODipine (NORVASC) 10 MG tablet TAKE 1 TABLET(10 MG) BY MOUTH DAILY 08/26/19   Laurey MoraleMcLean, Dalton S, MD  atorvastatin (LIPITOR) 10 MG tablet TAKE 1/2 TABLET(5 MG) BY MOUTH DAILY 08/19/19   Laurey MoraleMcLean, Dalton S, MD  ELIQUIS 5 MG TABS tablet TAKE 1 TABLET(5 MG) BY MOUTH TWICE DAILY 11/12/18   Allred, Fayrene FearingJames, MD  flecainide (TAMBOCOR) 50 MG tablet TAKE 1 TABLET BY MOUTH TWICE DAILY 08/26/19   Laurey MoraleMcLean, Dalton S, MD  fluticasone [facility](FLONASE) 50 MCG/ACT nasal spray Place 1 spray into the nose daily as needed.  12/20/18   [provider]  ibuprofen (ADVIL,MOTRIN) 200 MG tablet Take 200 mg by mouth every 6 (six) hours as needed (pain).     [provider]  lisinopril (ZESTRIL) 40 MG tablet TAKE 1 TABLET(40 MG) BY MOUTH DAILY 10/28/19   Laurey MoraleMcLean, Dalton S, MD  loratadine (CLARITIN) 10 MG tablet Take 10 mg by mouth daily as needed for allergies.    [provider]  metFORMIN (GLUCOPHAGE-XR) 500 MG 24 hr tablet Take 2,000 mg by mouth daily with breakfast.    [provider]  metoprolol succinate (TOPROL-XL) 50 MG 24 hr tablet Take 1 tablet (50 mg total) by mouth daily. Take with or immediately following a meal. 08/27/19   Laurey MoraleMcLean, Dalton S, MD  ONE TOUCH ULTRA TEST test strip CHECK BLOOD SUGAR ONCE D 05/28/15   [provider]  Semaglutide,0.25 or 0.5MG /DOS, (OZEMPIC, 0.25  OR 0.5 MG/DOSE,) 2 MG/1.5ML SOPN Inject 1.8 mg into the skin every 14 (fourteen) days.  10/16/19   [provider]  sildenafil (REVATIO) 20 MG tablet Take 80 mg by mouth as needed (ED). Take as directed 05/03/16   [provider]     Consultants:    Procedures/Significant Events:    I have personally reviewed and interpreted all radiology studies and my findings are as above.   VENTILATOR SETTINGS:    Cultures   Antimicrobials: Anti-infectives (From admission, onward)   Start     Dose/Rate Stop   11/12/19 1000  remdesivir 100 mg in sodium chloride 0.9 % 100 mL IVPB     100 mg 200 mL/hr over 30 Minutes 11/16/19 0959   11/11/19 1230  remdesivir 200 mg in sodium chloride 0.9% 250 mL IVPB     200 mg 580 mL/hr over 30 Minutes         Devices    LINES / TUBES:  Continuous Infusions: . remdesivir 200 mg in sodium chloride 0.9% 250 mL IVPB     Followed by  . [START ON 11/12/2019] remdesivir 100 mg in NS 100 mL      Physical Exam: Vitals:   11/11/19 0930 11/11/19 1000 11/11/19 1038 11/11/19 1100  BP: 108/74 112/76 124/86 126/80  Pulse: 81 81 78 84  Resp: 20 (!) 28 (!) 22 20  Temp: 98.1 F (36.7 C)  98.1 F (36.7 C) 98.3 F (36.8 C)  TempSrc: Oral  Oral Oral  SpO2: 96% 92% 95% 95%  Weight:      Height:        Wt Readings from Last 3 Encounters:  11/10/19 104.3 kg  10/21/19 105.5 kg  08/31/18 107.1 kg    General: A/O x4, positive acute respiratory distress Eyes: negative scleral hemorrhage, negative anisocoria, negative icterus ENT: Negative Runny nose, negative gingival bleeding, Neck:  Negative scars, masses, torticollis, lymphadenopathy, JVD Lungs: Clear to auscultation bilaterally without wheezes or crackles Cardiovascular: Regular rate and rhythm without murmur gallop or rub normal S1 and S2 Abdomen: Morbidly obese, negative abdominal pain, nondistended, positive soft, bowel sounds, no rebound, no ascites, no appreciable  mass Extremities: No significant cyanosis, clubbing, or edema bilateral lower extremities Skin: Negative rashes, lesions, ulcers Psychiatric:  Negative depression, negative anxiety, negative fatigue, negative mania  Central nervous system:  Cranial nerves II through XII intact, tongue/uvula midline, all extremities muscle strength 5/5, sensation intact throughout, negative dysarthria, negative expressive aphasia, negative receptive aphasia.        Labs on Admission:  Basic Metabolic Panel: Recent Labs  Lab 11/10/19 1441  NA 139  K 3.0*  CL 104  CO2 22  GLUCOSE 124*  BUN 15  CREATININE 0.79  CALCIUM 8.4*   Liver Function Tests: Recent Labs  Lab 11/10/19 1441  AST 34  ALT 25  ALKPHOS 48  BILITOT 0.8  PROT 7.0  ALBUMIN 3.3*   No results for input(s): LIPASE, AMYLASE in the last 168 hours. No results for input(s): AMMONIA in the last 168 hours. CBC: Recent Labs  Lab 11/10/19 1441  WBC 7.3  NEUTROABS 6.1  HGB 13.1  HCT 38.6*  MCV 91.3  PLT 286   Cardiac Enzymes: No results for input(s): CKTOTAL, CKMB, CKMBINDEX, TROPONINI in the last 168 hours.  BNP (last 3 results) No results for input(s): BNP in the last 8760 hours.  ProBNP (last 3 results) No results for input(s): PROBNP in the last 8760 hours.  CBG: Recent Labs  Lab 11/11/19 0619  GLUCAP 202*    Radiological Exams on Admission: DG Chest Portable 1 View  Result Date: 11/10/2019 CLINICAL DATA:  Short of breath for 1 week with fever. Exposure to COVID 1910 days ago. EXAM: PORTABLE CHEST 1 VIEW COMPARISON:  None. FINDINGS: There are hazy airspace opacities which are noted in the peripheral left mid to upper and lower lung, and in the right mid and lower lung. No pleural effusion. No pneumothorax. Cardiac silhouette is normal in size. No mediastinal or hilar masses. Skeletal structures are grossly intact. IMPRESSION: 1. Bilateral hazy mid to lower lung peripheral airspace opacities, greater on the left,  with a pattern consistent with COVID-19 pneumonia. Electronically Signed   By: Lajean Manes M.D.   On: 11/10/2019 13:57    EKG: Independently reviewed.  Pending  Assessment/Plan Active Problems:   Acute respiratory failure with hypoxia (HCC)   Pneumonia due to COVID-19 virus   AF (paroxysmal atrial fibrillation) (HCC)   Diabetes  mellitus type 2, uncontrolled, with complications (HCC)   Diabetic retinopathy (HCC)   HLD (hyperlipidemia)   Obesity, diabetes, and hypertension syndrome (HCC)  Covid pneumonia/acute respiratory failure with hypoxia COVID-19 Labs  Recent Labs    11/10/19 1441 11/11/19 1240  DDIMER 0.33 0.46  FERRITIN 275 297  LDH 247* 268*  CRP 11.2* 13.4*    Lab Results  Component Value Date   SARSCOV2NAA POSITIVE (A) 11/10/2019   SARSCOV2NAA POSITIVE (A) 11/10/2019  -Decadron 6 mg daily -Remdesivir per pharmacy protocol -Discussed with patient that Actemra use is off label and that there have been no randomized controlled trial data.  Counseled that all data was antidotal which showed benefit for acutely ill patients.  Patient negative history of hepatitis, immunosuppressant medication for arthritis, chemotherapeutic agents.  Patient stated use if required -12/14 Actemra x1 dose -Combivent QID -Vitamins per Covid protocol -Titrate O2 to maintain SPO2> 88% -Prone patient 16 hours/day; if patient cannot tolerate prone for 2 to 3 hours per shift  [facility] -See Covid pneumonia  Paroxysmal atrial fibrillation -Rate controlled -Amlodipine 10 mg daily -Toprol 50 mg daily -Eliquis 5 mg BID  Diabetes type 2 controlled with complication/Diabetic Retinopathy -12/14 hemoglobin A1c= 6.4 -Moderate SSI  HLD -12/14 LDL= 45 -Atorvastatin 5 mg daily      Code Status: Full (DVT Prophylaxis: Eliquis Family Communication: 12/14 spoke with Raynelle Fanning (wife) counseled on plan of care answered all questions Disposition Plan: TBD   Data Reviewed: Care during the  described time interval was provided by me .  I have reviewed this patient's available data, including medical history, events of note, physical examination, and all test results as part of my evaluation.   The patient is critically ill with multiple organ systems failure and requires high complexity decision making for assessment and support, frequent evaluation and titration of therapies, application of advanced monitoring technologies and extensive interpretation of multiple databases. Critical Care Time devoted to patient care services described in this note  Time spent: 70 minutes   Faye Strohman, Roselind Messier [facility] Pager [phone]

## 2019-11-11 NOTE — ED Notes (Signed)
Patient FIO2 increased to 6L with humidity to provide an oxygen saturation greater than 94%. Patient tolerated well

## 2019-11-11 NOTE — ED Provider Notes (Addendum)
12:40 AM  Patient with increasing oxygen requirement.  Initial Covid testing negative.  No PUI beds available at this time.  Rapid testing was sent given patient's deteriorating clinical status and need for transfer.  PCR testing is positive for Covid.  I discussed the patient with Dr. Vanita Ingles, at [facility].  Patient has been accepted to [facility].  6:26 AM Home BP and Eliquis meds ordered.   Merryl Hacker, MD 11/11/19 4098    Merryl Hacker, MD 11/11/19 [phone]

## 2019-11-11 NOTE — ED Notes (Signed)
Pt transferred via [facility] to [facility]

## 2019-11-11 NOTE — ED Notes (Signed)
Report called to receiving nurse at [facility], [place] RN

## 2019-11-11 NOTE — ED Notes (Signed)
Spoke with George Carr from [place] - patient is ready for transport

## 2019-11-11 NOTE — ED Notes (Signed)
Report provided to [facility] for transport.  ETA for pickup [phone]

## 2019-11-11 NOTE — ED Notes (Signed)
Call to [facility] to provide report, nurse unavailable at this time, will call back.

## 2019-11-11 NOTE — ED Notes (Signed)
Pt comfortable at this time, awaiting transport to [facility].

## 2019-11-12 DIAGNOSIS — J9601 Acute respiratory failure with hypoxia: Secondary | ICD-10-CM

## 2019-11-12 DIAGNOSIS — U071 COVID-19: Principal | ICD-10-CM

## 2019-11-12 DIAGNOSIS — J1289 Other viral pneumonia: Secondary | ICD-10-CM

## 2019-11-12 DIAGNOSIS — E1165 Type 2 diabetes mellitus with hyperglycemia: Secondary | ICD-10-CM

## 2019-11-12 DIAGNOSIS — E118 Type 2 diabetes mellitus with unspecified complications: Secondary | ICD-10-CM

## 2019-11-12 LAB — COMPREHENSIVE METABOLIC PANEL
ALT: 31 U/L (ref 0–44)
AST: 33 U/L (ref 15–41)
Albumin: 3.3 g/dL — ABNORMAL LOW (ref 3.5–5.0)
Alkaline Phosphatase: 44 U/L (ref 38–126)
Anion gap: 10 (ref 5–15)
BUN: 22 mg/dL (ref 8–23)
CO2: 26 mmol/L (ref 22–32)
Calcium: 9.3 mg/dL (ref 8.9–10.3)
Chloride: 106 mmol/L (ref 98–111)
Creatinine, Ser: 0.79 mg/dL (ref 0.61–1.24)
GFR calc Af Amer: >60 mL/min (ref >60)
GFR calc non Af Amer: >60 mL/min (ref >60)
Glucose, Bld: 157 mg/dL — ABNORMAL HIGH (ref 70–99)
Potassium: 3.4 mmol/L — ABNORMAL LOW (ref 3.5–5.1)
Sodium: 142 mmol/L (ref 135–145)
Total Bilirubin: 0.5 mg/dL (ref 0.3–1.2)
Total Protein: 7.3 g/dL (ref 6.5–8.1)

## 2019-11-12 LAB — CBC WITH DIFFERENTIAL/PLATELET
Abs Immature Granulocytes: 0.02 10*3/uL (ref 0.00–0.07)
Basophils Absolute: 0 10*3/uL (ref 0.0–0.1)
Basophils Relative: 0 %
Eosinophils Absolute: 0 10*3/uL (ref 0.0–0.5)
Eosinophils Relative: 0 %
HCT: 39.2 % (ref 39.0–52.0)
Hemoglobin: 13.3 g/dL (ref 13.0–17.0)
Immature Granulocytes: 0 %
Lymphocytes Relative: 12 %
Lymphs Abs: 0.9 10*3/uL (ref 0.7–4.0)
MCH: 30.9 pg (ref 26.0–34.0)
MCHC: 33.9 g/dL (ref 30.0–36.0)
MCV: 91 fL (ref 80.0–100.0)
Monocytes Absolute: 0.4 10*3/uL (ref 0.1–1.0)
Monocytes Relative: 5 %
Neutro Abs: 6.4 10*3/uL (ref 1.7–7.7)
Neutrophils Relative %: 83 %
Platelets: 391 10*3/uL (ref 150–400)
RBC: 4.31 MIL/uL (ref 4.22–5.81)
RDW: 12.7 % (ref 11.5–15.5)
WBC: 7.7 10*3/uL (ref 4.0–10.5)
nRBC: 0 % (ref 0.0–0.2)

## 2019-11-12 LAB — GLUCOSE, CAPILLARY
Glucose-Capillary: 160 mg/dL — ABNORMAL HIGH (ref 70–99)
Glucose-Capillary: 156 mg/dL — ABNORMAL HIGH (ref 70–99)
Glucose-Capillary: 174 mg/dL — ABNORMAL HIGH (ref 70–99)
Glucose-Capillary: 241 mg/dL — ABNORMAL HIGH (ref 70–99)
Glucose-Capillary: 251 mg/dL — ABNORMAL HIGH (ref 70–99)
Glucose-Capillary: 95 mg/dL (ref 70–99)

## 2019-11-12 LAB — PHOSPHORUS: Phosphorus: 2.7 mg/dL (ref 2.5–4.6)

## 2019-11-12 LAB — MAGNESIUM: Magnesium: 2.1 mg/dL (ref 1.7–2.4)

## 2019-11-12 LAB — D-DIMER, QUANTITATIVE: D-Dimer, Quant: 0.35 ug/mL-FEU (ref 0.00–0.50)

## 2019-11-12 LAB — FERRITIN: Ferritin: 292 ng/mL (ref 24–336)

## 2019-11-12 LAB — C-REACTIVE PROTEIN: CRP: 9.1 mg/dL — ABNORMAL HIGH (ref <1.0)

## 2019-11-12 MED ORDER — GUAIFENESIN-CODEINE 100-10 MG/5ML PO SOLN
10.0000 mL | Freq: Four times a day (QID) | ORAL | Status: DC | PRN
  Administered 2019-11-13 – 2019-11-14 (×5): 10 mL via ORAL
  Filled 2019-11-12 (×6): qty 10

## 2019-11-12 MED ORDER — INSULIN DETEMIR 100 UNIT/ML ~~LOC~~ SOLN
10.0000 [IU] | Freq: Two times a day (BID) | SUBCUTANEOUS | Status: DC
  Administered 2019-11-12 – 2019-11-15 (×7): 10 [IU] via SUBCUTANEOUS
  Filled 2019-11-12 (×9): qty 0.1

## 2019-11-12 MED ORDER — POTASSIUM CHLORIDE CRYS ER 20 MEQ PO TBCR
40.0000 meq | EXTENDED_RELEASE_TABLET | Freq: Two times a day (BID) | ORAL | Status: AC
  Administered 2019-11-12 – 2019-11-13 (×4): 40 meq via ORAL
  Filled 2019-11-12 (×4): qty 2

## 2019-11-12 NOTE — Plan of Care (Signed)
  Problem: Education: Goal: Knowledge of risk factors and measures for prevention of condition will improve Outcome: Progressing   Problem: Coping: Goal: Psychosocial and spiritual needs will be supported Outcome: Progressing   Problem: Respiratory: Goal: Will maintain a patent airway Outcome: Progressing Goal: Complications related to the disease process, condition or treatment will be avoided or minimized Outcome: Progressing   Problem: Education: Goal: Knowledge of General Education information will improve Description: Including pain rating scale, medication(s)/side effects and non-pharmacologic comfort measures Outcome: Progressing   Problem: Health Behavior/Discharge Planning: Goal: Ability to manage health-related needs will improve Outcome: Progressing   Problem: Clinical Measurements: Goal: Ability to maintain clinical measurements within normal limits will improve Outcome: Progressing Goal: Will remain free from infection Outcome: Progressing Goal: Diagnostic test results will improve Outcome: Progressing Goal: Respiratory complications will improve Outcome: Progressing Goal: Cardiovascular complication will be avoided Outcome: Progressing   Problem: Activity: Goal: Risk for activity intolerance will decrease Outcome: Progressing   Problem: Coping: Goal: Level of anxiety will decrease Outcome: Progressing   Problem: Pain Managment: Goal: General experience of comfort will improve Outcome: Progressing   Problem: Safety: Goal: Ability to remain free from injury will improve Outcome: Progressing   Problem: Skin Integrity: Goal: Risk for impaired skin integrity will decrease Outcome: Progressing

## 2019-11-12 NOTE — Progress Notes (Signed)
Inpatient Diabetes Program Recommendations  AACE/ADA: New Consensus Statement on Inpatient Glycemic Control (2015)  Target Ranges:  Prepandial:   less than 140 mg/dL      Peak postprandial:   less than 180 mg/dL (1-2 hours)      Critically ill patients:  140 - 180 mg/dL   Lab Results  Component Value Date   GLUCAP 156 (H) 11/12/2019   HGBA1C 6.4 (H) 11/11/2019    Review of Glycemic Control Results for George Carr, George Carr (MRN [id number]) as of 11/12/2019 10:48  Ref. Range 11/11/2019 17:32 11/11/2019 20:39 11/11/2019 23:44 11/12/2019 03:50 11/12/2019 07:26  Glucose-Capillary Latest Ref Range: 70 - 99 mg/dL 252 (H) 258 (H) 196 (H) 160 (H) 156 (H)   Diabetes history: DM 2 Outpatient Diabetes medications: Metformin 1000 mg bid, Ozempic 1.8 mg weekly Current orders for Inpatient glycemic control:  Novolog moderate q 4 hours, Decadron 6 mg daily Inpatient Diabetes Program Recommendations:   Please consider adding Levemir 10 units bid (0.2 unit/kg) per COVID- glycemic control order set.   Thanks  Adah Perl, RN, BC-ADM Inpatient Diabetes Coordinator Pager [phone] (8a-5p)

## 2019-11-12 NOTE — Progress Notes (Signed)
PROGRESS NOTE    George Carr  TOI:[id number] DOB: 07-22-1956 DOA: 11/10/2019 PCP: Jefm Petty, MD    Brief Narrative:  63 year old gentleman with history of paroxysmal A. fib on Eliquis, type 2 diabetes on Metformin, diabetic retinopathy, essential hypertension hyperlipidemia and sleep apnea who presented to the emergency room with cough, fever, shortness of breath and fatigue for 4 days.  He does use CPAP at night and some supplemental oxygen. In the emergency room, he was found hypoxic, needed 6 L high flow nasal cannula oxygen to keep saturations more than 90%.  Admitted to the [facility] with Covid 19 viral pneumonia.   Assessment & Plan:   Active Problems:   Acute respiratory failure with hypoxia (HCC)   Pneumonia due to COVID-19 virus   AF (paroxysmal atrial fibrillation) (HCC)   Diabetes mellitus type 2, uncontrolled, with complications (HCC)   Diabetic retinopathy ([place])   HLD (hyperlipidemia)   Obesity, diabetes, and hypertension syndrome (HCC)  Acute respiratory failure with hypoxemia, pneumonia due to COVID-19 virus: Continue to monitor due to significant symptoms.  Still on supplemental oxygen with some clinical improvement today. chest physiotherapy, incentive spirometry, deep breathing exercises, sputum induction, mucolytic's and bronchodilators. Supplemental oxygen to keep saturations more than 90%. Covid directed therapy with , steroids, dexamethasone day 2 remdesivir, day 2/5 actemra, received 800 mg on 11/11/2019 antibiotics, not indicated Due to severity of symptoms, patient will need daily inflammatory markers, liver function test to monitor and direct COVID-19 therapies.  Advance activities.  Obstructive sleep apnea: Not using CPAP to avoid aerosolization..  May need additional oxygen at night.  Paroxysmal A. fib: Currently sinus rhythm.  On flecainide Toprol.  Rate controlled.  Therapeutic on Eliquis.  Type 2 diabetes on Metformin at  home: Hold Metformin.  On sliding scale insulin.  Add long-acting insulin with concomitant use of steroids.  Hyperlipidemia: On a statin that continued.  Hypertension: Blood pressure is stable.  Continue home medications including amlodipine.  Electrolytes: Hypokalemia: We will replace with potassium to keep potassium more than 4, magnesium is 2.1.   DVT prophylaxis: Eliquis Code Status: Full code Family Communication: Patient talking to the family Disposition Plan: Remains inpatient   Consultants:   None  Procedures:   None  Antimicrobials:   Remdesivir, 11/11/2019>>>>   Subjective: Patient seen and examined.  No overnight events.  Still has significant cough and spasms in his chest.  Remains on 2 L oxygen.  Afebrile overnight.  Objective: Vitals:   11/12/19 0200 11/12/19 0300 11/12/19 0400 11/12/19 0725  BP:   121/86 117/75  Pulse: 87 81 76 78  Resp: (!) 26 (!) _0 Temp:   98.6 F (37 C) 98.4 F (36.9 C)  TempSrc:    Oral  SpO2: 92% 91% 93% 92%  Weight:      Height:        Intake/Output Summary (Last 24 hours) at 11/12/2019 1052 Last data filed at 11/12/2019 0739 Gross per 24 hour  Intake 830 ml  Output 1200 ml  Net -370 ml   Filed Weights   11/10/19 1238  Weight: 104.3 kg    Examination:  General exam: Appears calm and comfortable on 2 L oxygen, has episodic cough that makes him uncomfortable. Respiratory system: Clear to auscultation. Respiratory effort normal.  Conducted airway sounds. Cardiovascular system: S1 & S2 heard, RRR. No JVD, murmurs, rubs, gallops or clicks. No pedal edema. Gastrointestinal system: Abdomen is nondistended, soft and nontender. No organomegaly or masses felt.  Normal bowel sounds heard. Central nervous system: Alert and oriented. No focal neurological deficits. Extremities: Symmetric 5 x 5 power. Skin: No rashes, lesions or ulcers Psychiatry: Judgement and insight appear normal. Mood & affect appropriate.      Data Reviewed: I have personally reviewed following labs and imaging studies  CBC: Recent Labs  Lab 11/10/19 1441 11/11/19 1240 11/12/19 0240  WBC 7.3 6.7 7.7  NEUTROABS 6.1 6.0 6.4  HGB 13.1 14.0 13.3  HCT 38.6* 41.6 39.2  MCV 91.3 90.8 91.0  PLT 286 333 893   Basic Metabolic Panel: Recent Labs  Lab 11/10/19 1441 11/11/19 1240 11/12/19 0240  NA 139 143 142  K 3.0* 3.6 3.4*  CL 104 106 106  CO2 _0 GLUCOSE 124* 211* 157*  BUN _1 CREATININE 0.79 0.82 0.79  CALCIUM 8.4* 9.2 9.3  MG  --   --  2.1  PHOS  --   --  2.7   GFR: Estimated Creatinine Clearance: 118 mL/min (by C-G formula based on SCr of 0.79 mg/dL). Liver Function Tests: Recent Labs  Lab 11/10/19 1441 11/11/19 1240 11/12/19 0240  AST 34 37 33  ALT _2 ALKPHOS 48 51 44  BILITOT 0.8 0.6 0.5  PROT 7.0 7.7 7.3  ALBUMIN 3.3* 3.5 3.3*   No results for input(s): LIPASE, AMYLASE in the last 168 hours. No results for input(s): AMMONIA in the last 168 hours. Coagulation Profile: No results for input(s): INR, PROTIME in the last 168 hours. Cardiac Enzymes: No results for input(s): CKTOTAL, CKMB, CKMBINDEX, TROPONINI in the last 168 hours. BNP (last 3 results) No results for input(s): PROBNP in the last 8760 hours. HbA1C: Recent Labs    11/11/19 1240  HGBA1C 6.4*   CBG: Recent Labs  Lab 11/11/19 1732 11/11/19 2039 11/11/19 2344 11/12/19 0350 11/12/19 0726  GLUCAP 252* 258* 196* 160* 156*   Lipid Profile: Recent Labs    11/10/19 1441 11/11/19 1240  CHOL  --  97  HDL  --  37*  LDLCALC  --  45  TRIG 83 75  CHOLHDL  --  2.6   Thyroid Function Tests: No results for input(s): TSH, T4TOTAL, FREET4, T3FREE, THYROIDAB in the last 72 hours. Anemia Panel: Recent Labs    11/11/19 1240 11/12/19 0240  FERRITIN 297 292   Sepsis Labs: Recent Labs  Lab 11/10/19 1440 11/10/19 1441 11/11/19 1240  PROCALCITON  --  <0.10 <0.10  LATICACIDVEN 1.6  --   --     Recent  Results (from the past 240 hour(s))  SARS CORONAVIRUS 2 (TAT 6-24 HRS) Nasopharyngeal Nasopharyngeal Swab     Status: Abnormal   Collection Time: 11/10/19  1:57 PM   Specimen: Nasopharyngeal Swab  Result Value Ref Range Status   SARS Coronavirus 2 POSITIVE (A) NEGATIVE Final    Comment: RESULT CALLED TO, READ BACK BY AND VERIFIED WITH: RN V POWELL _3  11/11/19 BY S GEZAHEGN (NOTE) SARS-CoV-2 target nucleic acids are DETECTED. The SARS-CoV-2 RNA is generally detectable in upper and lower respiratory specimens during the acute phase of infection. Positive results are indicative of the presence of SARS-CoV-2 RNA. Clinical correlation with patient history and other diagnostic information is  necessary to determine patient infection status. Positive results do not rule out bacterial infection or co-infection with other viruses.  The expected result is Negative. Fact Sheet for Patients: SugarRoll.be Fact Sheet for Healthcare Providers: https://www.woods-mathews.com/ This test is not yet approved or cleared by the  [place] Architectural technologist and  has been authorized for detection and/or diagnosis of SARS-CoV-2 by FDA under an Print production planner (EUA). This EUA will remain  in effect (meaning this test can be used) fo r the duration of the COVID-19 declaration under Section 564(b)(1) of the Act, 21 U.S.C. section 360bbb-3(b)(1), unless the authorization is terminated or revoked sooner. Performed at [facility], [place] [address] [place], [place] [address]   Blood Culture (routine x 2)     Status: None (Preliminary result)   Collection Time: 11/10/19  3:00 PM   Specimen: BLOOD  Result Value Ref Range Status   Specimen Description   Final    BLOOD LEFT ANTECUBITAL Performed at [facility], [address] [place], [place] [address]    Special Requests   Final    BOTTLES DRAWN AEROBIC AND ANAEROBIC Blood Culture results may not be  optimal due to an excessive volume of blood received in culture bottles Performed at [facility], [address] [place], [place] [address]    Culture   Final    NO GROWTH < 12 HOURS Performed at [facility], [place] [address], [place], [place] [address]    Report Status PENDING  Incomplete  Blood Culture (routine x 2)     Status: None (Preliminary result)   Collection Time: 11/10/19  3:00 PM   Specimen: BLOOD  Result Value Ref Range Status   Specimen Description   Final    BLOOD RIGHT ANTECUBITAL Performed at [facility], [address] [place], [place] [address]    Special Requests   Final    BOTTLES DRAWN AEROBIC AND ANAEROBIC Blood Culture adequate volume Performed at [facility], [address] [place], [place] [address]    Culture   Final    NO GROWTH < 12 HOURS Performed at [facility], [place] [address], [place], [place] [address]    Report Status PENDING  Incomplete  SARS Coronavirus 2 Ag (30 min TAT) - Nasal Swab (BD Veritor Kit)     Status: None   Collection Time: 11/10/19  4:00 PM   Specimen: Nasal Swab (BD Veritor Kit)  Result Value Ref Range Status   SARS Coronavirus 2 Ag NEGATIVE NEGATIVE Final    Comment: (NOTE) SARS-CoV-2 antigen NOT DETECTED.  Negative results are presumptive.  Negative results do not preclude SARS-CoV-2 infection and should not be used as the sole basis for treatment or other patient management decisions, including infection  control decisions, particularly in the presence of clinical signs and  symptoms consistent with COVID-19, or in those who have been in contact with the virus.  Negative results must be combined with clinical observations, patient history, and epidemiological information. The expected result is Negative. Fact Sheet for Patients: PodPark.tn Fact Sheet for Healthcare Providers: GiftContent.is This test is not yet  approved or cleared by the [place] FDA and  has been authorized for detection and/or diagnosis of SARS-CoV-2 by FDA under an Emergency Use Authorization (EUA).  This EUA will remain in effect (meaning this test can be used) for the duration of  the COVID-19 de claration under Section 564(b)(1) of the Act, 21 U.S.C. section 360bbb-3(b)(1), unless the authorization is terminated or revoked sooner. Performed at [facility], [address] [place], [place] [address]   SARS Coronavirus 2 by RT PCR (hospital order, performed in [facility] hospital lab) Nasopharyngeal  Nasopharyngeal Swab     Status: Abnormal   Collection Time: 11/10/19 11:14 PM   Specimen: Nasopharyngeal Swab  Result Value Ref Range Status   SARS Coronavirus 2 POSITIVE (A) NEGATIVE Final    Comment: RESULT CALLED TO, READ BACK BY AND VERIFIED WITH: MAYNARD,C,RN @ 0014 11/11/19 BY GWYN,P Performed at [facility], [address] [place], [place] [address]          Radiology Studies: Portable chest 1 View  Result Date: 11/11/2019 CLINICAL DATA:  Shortness of breath. COVID-19 infection. EXAM: PORTABLE CHEST 1 VIEW COMPARISON:  Radiographs 11/10/2019. FINDINGS: 1417 hours. The heart size and mediastinal contours are stable without definite adenopathy. There are worsening peripheral ground-glass opacities in both lungs, left greater than right. There is no pneumothorax or significant pleural effusion. Probable loop recorder overlies the lower left chest. No acute osseous findings. IMPRESSION: Worsened peripheral ground-glass opacities in both lungs, left greater than right, most compatible with worsening COVID-19 pneumonia. Electronically Signed   By: Richardean Sale M.D.   On: 11/11/2019 14:26   DG Chest Portable 1 View  Result Date: 11/10/2019 CLINICAL DATA:  Short of breath for 1 week with fever. Exposure to COVID 1910 days ago. EXAM: PORTABLE CHEST 1 VIEW COMPARISON:  None. FINDINGS: There  are hazy airspace opacities which are noted in the peripheral left mid to upper and lower lung, and in the right mid and lower lung. No pleural effusion. No pneumothorax. Cardiac silhouette is normal in size. No mediastinal or hilar masses. Skeletal structures are grossly intact. IMPRESSION: 1. Bilateral hazy mid to lower lung peripheral airspace opacities, greater on the left, with a pattern consistent with COVID-19 pneumonia. Electronically Signed   By: Lajean Manes M.D.   On: 11/10/2019 13:57        Scheduled Meds: . amLODipine  10 mg Oral Daily  . apixaban  5 mg Oral BID  . vitamin C  500 mg Oral Daily  . aspirin EC  81 mg Oral Daily  . atorvastatin  5 mg Oral q1800  . dexamethasone (DECADRON) injection  6 mg Intravenous Q24H  . flecainide  50 mg Oral Q12H  . folic acid  1 mg Oral Daily  . insulin aspart  0-15 Units Subcutaneous Q4H  . insulin detemir  10 Units Subcutaneous BID  . Ipratropium-Albuterol  1 puff Inhalation Q6H  . metoprolol succinate  50 mg Oral Daily  . potassium chloride  40 mEq Oral BID  . thiamine  100 mg Oral Daily  . zinc sulfate  220 mg Oral Daily   Continuous Infusions: . remdesivir 100 mg in NS 100 mL       LOS: 2 days    Time spent: 35 minutes    Barb Merino, MD [facility] Pager [phone]

## 2019-11-13 DIAGNOSIS — E1169 Type 2 diabetes mellitus with other specified complication: Secondary | ICD-10-CM

## 2019-11-13 DIAGNOSIS — E669 Obesity, unspecified: Secondary | ICD-10-CM

## 2019-11-13 DIAGNOSIS — I10 Essential (primary) hypertension: Secondary | ICD-10-CM

## 2019-11-13 DIAGNOSIS — I480 Paroxysmal atrial fibrillation: Secondary | ICD-10-CM

## 2019-11-13 DIAGNOSIS — E1159 Type 2 diabetes mellitus with other circulatory complications: Secondary | ICD-10-CM

## 2019-11-13 DIAGNOSIS — E7800 Pure hypercholesterolemia, unspecified: Secondary | ICD-10-CM

## 2019-11-13 LAB — CBC WITH DIFFERENTIAL/PLATELET
Abs Immature Granulocytes: 0.06 10*3/uL (ref 0.00–0.07)
Basophils Absolute: 0 10*3/uL (ref 0.0–0.1)
Basophils Relative: 0 %
Eosinophils Absolute: 0 10*3/uL (ref 0.0–0.5)
Eosinophils Relative: 0 %
HCT: 40.3 % (ref 39.0–52.0)
Hemoglobin: 13.4 g/dL (ref 13.0–17.0)
Immature Granulocytes: 1 %
Lymphocytes Relative: 16 %
Lymphs Abs: 1.1 10*3/uL (ref 0.7–4.0)
MCH: 30.3 pg (ref 26.0–34.0)
MCHC: 33.3 g/dL (ref 30.0–36.0)
MCV: 91.2 fL (ref 80.0–100.0)
Monocytes Absolute: 0.3 10*3/uL (ref 0.1–1.0)
Monocytes Relative: 4 %
Neutro Abs: 5.4 10*3/uL (ref 1.7–7.7)
Neutrophils Relative %: 79 %
Platelets: 429 10*3/uL — ABNORMAL HIGH (ref 150–400)
RBC: 4.42 MIL/uL (ref 4.22–5.81)
RDW: 12.9 % (ref 11.5–15.5)
WBC: 6.9 10*3/uL (ref 4.0–10.5)
nRBC: 0 % (ref 0.0–0.2)

## 2019-11-13 LAB — GLUCOSE, CAPILLARY
Glucose-Capillary: 103 mg/dL — ABNORMAL HIGH (ref 70–99)
Glucose-Capillary: 119 mg/dL — ABNORMAL HIGH (ref 70–99)
Glucose-Capillary: 126 mg/dL — ABNORMAL HIGH (ref 70–99)
Glucose-Capillary: 175 mg/dL — ABNORMAL HIGH (ref 70–99)
Glucose-Capillary: 275 mg/dL — ABNORMAL HIGH (ref 70–99)
Glucose-Capillary: 379 mg/dL — ABNORMAL HIGH (ref 70–99)

## 2019-11-13 LAB — COMPREHENSIVE METABOLIC PANEL
ALT: 37 U/L (ref 0–44)
AST: 36 U/L (ref 15–41)
Albumin: 3.2 g/dL — ABNORMAL LOW (ref 3.5–5.0)
Alkaline Phosphatase: 45 U/L (ref 38–126)
Anion gap: 11 (ref 5–15)
BUN: 26 mg/dL — ABNORMAL HIGH (ref 8–23)
CO2: 23 mmol/L (ref 22–32)
Calcium: 8.8 mg/dL — ABNORMAL LOW (ref 8.9–10.3)
Chloride: 111 mmol/L (ref 98–111)
Creatinine, Ser: 0.85 mg/dL (ref 0.61–1.24)
GFR calc Af Amer: >60 mL/min (ref >60)
GFR calc non Af Amer: >60 mL/min (ref >60)
Glucose, Bld: 148 mg/dL — ABNORMAL HIGH (ref 70–99)
Potassium: 3.7 mmol/L (ref 3.5–5.1)
Sodium: 145 mmol/L (ref 135–145)
Total Bilirubin: 0.6 mg/dL (ref 0.3–1.2)
Total Protein: 6.9 g/dL (ref 6.5–8.1)

## 2019-11-13 LAB — PHOSPHORUS: Phosphorus: 2.9 mg/dL (ref 2.5–4.6)

## 2019-11-13 LAB — D-DIMER, QUANTITATIVE: D-Dimer, Quant: 0.48 ug/mL-FEU (ref 0.00–0.50)

## 2019-11-13 LAB — C-REACTIVE PROTEIN: CRP: 4 mg/dL — ABNORMAL HIGH (ref <1.0)

## 2019-11-13 LAB — MAGNESIUM: Magnesium: 1.7 mg/dL (ref 1.7–2.4)

## 2019-11-13 LAB — FERRITIN: Ferritin: 311 ng/mL (ref 24–336)

## 2019-11-13 NOTE — Plan of Care (Signed)
  Problem: Education: Goal: Knowledge of risk factors and measures for prevention of condition will improve Outcome: Progressing   Problem: Coping: Goal: Psychosocial and spiritual needs will be supported Outcome: Progressing   Problem: Respiratory: Goal: Will maintain a patent airway Outcome: Progressing Goal: Complications related to the disease process, condition or treatment will be avoided or minimized Outcome: Progressing   Problem: Education: Goal: Knowledge of General Education information will improve Description: Including pain rating scale, medication(s)/side effects and non-pharmacologic comfort measures Outcome: Progressing   Problem: Health Behavior/Discharge Planning: Goal: Ability to manage health-related needs will improve Outcome: Progressing   Problem: Clinical Measurements: Goal: Respiratory complications will improve Outcome: Progressing   Problem: Activity: Goal: Risk for activity intolerance will decrease Outcome: Progressing   Problem: Safety: Goal: Ability to remain free from injury will improve Outcome: Progressing   Problem: Coping: Goal: Level of anxiety will decrease Outcome: Completed/Met

## 2019-11-13 NOTE — Progress Notes (Signed)
PROGRESS NOTE    George Carr  KGM:[id number] DOB: 26-Feb-1956 DOA: 11/10/2019 PCP: Jefm Petty, MD    Brief Narrative:  63 year old gentleman with history of paroxysmal A. fib on Eliquis, type 2 diabetes on Metformin, diabetic retinopathy, essential hypertension hyperlipidemia and sleep apnea who presented to the emergency room with cough, fever, shortness of breath and fatigue for 4 days.  He does use CPAP at night and some supplemental oxygen. In the emergency room, he was found hypoxic, needed 6 L high flow nasal cannula oxygen to keep saturations more than 90%.  Admitted to the [facility] with Covid 19 viral pneumonia.   Subjective: No acute issues or events overnight, complaining of some anxiety and "feeling trapped in this place" -discussed with wife over the phone at bedside who does complain the patient does suffer from anxiety with very poor follow-up in the outpatient setting.  At this time patient declines chest pain, shortness of breath, nausea, vomiting, diarrhea, constipation, headache, fevers, chills.  Patient requesting discharge home, lengthy discussion about patient's ongoing hypoxia need for further therapy.   Assessment & Plan:   Active Problems:   Acute respiratory failure with hypoxia (HCC)   Pneumonia due to COVID-19 virus   AF (paroxysmal atrial fibrillation) (HCC)   Diabetes mellitus type 2, uncontrolled, with complications (HCC)   Diabetic retinopathy ([place])   HLD (hyperlipidemia)   Obesity, diabetes, and hypertension syndrome (HCC)   Acute respiratory failure with hypoxemia, pneumonia due to COVID-19 virus, POA: Continue to monitor due to significant symptoms.  Still on supplemental oxygen with some clinical improvement today. SpO2: 94 % O2 Flow Rate (L/min): 3 L/min FiO2 (%): 98 % Continue chest physiotherapy, incentive spirometry, deep breathing exercises, sputum induction, mucolytic's and bronchodilators. Covid directed therapy with,  steroids, dexamethasone day 3/10(stop 12/23) remdesivir, day 3/5 (stop 18th), actemra received 800 mg on 11/11/2019 antibiotics, not indicated Due to severity of symptoms, patient will need daily inflammatory markers, liver function test to monitor and direct COVID-19 therapies.  Advance activities. Recent Labs    11/10/19 1441 11/11/19 1240 11/12/19 0240 11/13/19 0355  DDIMER 0.33 0.46 0.35 0.48  FERRITIN 275 297 292 311  LDH 247* 268*  --   --   CRP 11.2* 13.4* 9.1* 4.0*   Obstructive sleep apnea:  Not using CPAP to avoid aerosolization..  May need additional oxygen at night -will need to be weaned back down aggressively each morning to ensure appropriate oxygen levels while awake.  Paroxysmal A. fib: Currently rate controlled in sinus rhythm.  On flecainide, Toprol.  Therapeutic on Eliquis.  Type 2 diabetes on Metformin at home: Hold Metformin.  On sliding scale insulin.  Add long-acting insulin with concomitant use of steroids.  Hyperlipidemia: On a statin that continued.  Hypertension: Blood pressure is stable.  Continue home medications including amlodipine.  Mild hypokalemia: Continue to follow a.m. labs   DVT prophylaxis: Eliquis Code Status: Full code Family Communication: Patient talking to the family Disposition Plan: Remains inpatient   Consultants:   None  Procedures:   None  Antimicrobials:   Remdesivir, 11/11/2019>>>>11/15/19    Objective: Vitals:   11/12/19 1939 11/12/19 2344 11/13/19 0316 11/13/19 0811  BP: 119/73 117/81 117/87 125/87  Pulse: 80 71 78 72  Resp: 17  20 16   Temp: 98.3 F (36.8 C) 98.3 F (36.8 C) 98.4 F (36.9 C) 97.8 F (36.6 C)  TempSrc: Oral Oral Oral Oral  SpO2: 90% 90% 92% 94%  Weight:  Height:        Intake/Output Summary (Last 24 hours) at 11/13/2019 1308 Last data filed at 11/13/2019 0300 Gross per 24 hour  Intake 240 ml  Output 550 ml  Net -310 ml   Filed Weights   11/10/19 1238  Weight: 104.3 kg     Examination:  General exam: Appears calm and comfortable on 2 L oxygen, has episodic cough that makes him uncomfortable. Respiratory system: Clear to auscultation. Respiratory effort normal.  Conducted airway sounds. Cardiovascular system: S1 & S2 heard, RRR. No JVD, murmurs, rubs, gallops or clicks. No pedal edema. Gastrointestinal system: Abdomen is nondistended, soft and nontender. No organomegaly or masses felt. Normal bowel sounds heard. Central nervous system: Alert and oriented. No focal neurological deficits. Extremities: Symmetric 5 x 5 power. Skin: No rashes, lesions or ulcers Psychiatry: Judgement and insight appear normal. Mood & affect appropriate.     Data Reviewed: I have personally reviewed following labs and imaging studies  CBC: Recent Labs  Lab 11/10/19 1441 11/11/19 1240 11/12/19 0240 11/13/19 0355  WBC 7.3 6.7 7.7 6.9  NEUTROABS 6.1 6.0 6.4 5.4  HGB 13.1 14.0 13.3 13.4  HCT 38.6* 41.6 39.2 40.3  MCV 91.3 90.8 91.0 91.2  PLT 286 333 391 381*   Basic Metabolic Panel: Recent Labs  Lab 11/10/19 1441 11/11/19 1240 11/12/19 0240 11/13/19 0355  NA 139 143 142 145  K 3.0* 3.6 3.4* 3.7  CL 104 106 106 111  CO2 22 23 26 23   GLUCOSE 124* 211* 157* 148*  BUN 15 17 22  26*  CREATININE 0.79 0.82 0.79 0.85  CALCIUM 8.4* 9.2 9.3 8.8*  MG  --   --  2.1 1.7  PHOS  --   --  2.7 2.9   GFR: Estimated Creatinine Clearance: 111.1 mL/min (by C-G formula based on SCr of 0.85 mg/dL). Liver Function Tests: Recent Labs  Lab 11/10/19 1441 11/11/19 1240 11/12/19 0240 11/13/19 0355  AST 34 37 33 36  ALT 25 31 31  37  ALKPHOS 48 51 44 45  BILITOT 0.8 0.6 0.5 0.6  PROT 7.0 7.7 7.3 6.9  ALBUMIN 3.3* 3.5 3.3* 3.2*   No results for input(s): LIPASE, AMYLASE in the last 168 hours. No results for input(s): AMMONIA in the last 168 hours. Coagulation Profile: No results for input(s): INR, PROTIME in the last 168 hours. Cardiac Enzymes: No results for input(s):  CKTOTAL, CKMB, CKMBINDEX, TROPONINI in the last 168 hours. BNP (last 3 results) No results for input(s): PROBNP in the last 8760 hours. HbA1C: Recent Labs    11/11/19 1240  HGBA1C 6.4*   CBG: Recent Labs  Lab 11/12/19 2322 11/12/19 2348 11/13/19 0314 11/13/19 0806 11/13/19 1116  GLUCAP 95 103* 126* 119* 175*   Lipid Profile: Recent Labs    11/10/19 1441 11/11/19 1240  CHOL  --  97  HDL  --  37*  LDLCALC  --  45  TRIG 83 75  CHOLHDL  --  2.6   Thyroid Function Tests: No results for input(s): TSH, T4TOTAL, FREET4, T3FREE, THYROIDAB in the last 72 hours. Anemia Panel: Recent Labs    11/12/19 0240 11/13/19 0355  FERRITIN 292 311   Sepsis Labs: Recent Labs  Lab 11/10/19 1440 11/10/19 1441 11/11/19 1240  PROCALCITON  --  <0.10 <0.10  LATICACIDVEN 1.6  --   --     Recent Results (from the past 240 hour(s))  SARS CORONAVIRUS 2 (TAT 6-24 HRS) Nasopharyngeal Nasopharyngeal Swab     Status:  Abnormal   Collection Time: 11/10/19  1:57 PM   Specimen: Nasopharyngeal Swab  Result Value Ref Range Status   SARS Coronavirus 2 POSITIVE (A) NEGATIVE Final    Comment: RESULT CALLED TO, READ BACK BY AND VERIFIED WITH: RN V POWELL @0109  11/11/19 BY S GEZAHEGN (NOTE) SARS-CoV-2 target nucleic acids are DETECTED. The SARS-CoV-2 RNA is generally detectable in upper and lower respiratory specimens during the acute phase of infection. Positive results are indicative of the presence of SARS-CoV-2 RNA. Clinical correlation with patient history and other diagnostic information is  necessary to determine patient infection status. Positive results do not rule out bacterial infection or co-infection with other viruses.  The expected result is Negative. Fact Sheet for Patients: SugarRoll.be Fact Sheet for Healthcare Providers: https://www.woods-mathews.com/ This test is not yet approved or cleared by the [place] FDA and  has been  authorized for detection and/or diagnosis of SARS-CoV-2 by FDA under an Emergency Use Authorization (EUA). This EUA will remain  in effect (meaning this test can be used) fo r the duration of the COVID-19 declaration under Section 564(b)(1) of the Act, 21 U.S.C. section 360bbb-3(b)(1), unless the authorization is terminated or revoked sooner. Performed at [facility], [place] [address], [place], [place] [address]   Blood Culture (routine x 2)     Status: None (Preliminary result)   Collection Time: 11/10/19  3:00 PM   Specimen: BLOOD  Result Value Ref Range Status   Specimen Description   Final    BLOOD LEFT ANTECUBITAL Performed at [facility], [address] [place], [place] [address]    Special Requests   Final    BOTTLES DRAWN AEROBIC AND ANAEROBIC Blood Culture results may not be optimal due to an excessive volume of blood received in culture bottles Performed at [facility], [address] [place], [place] [address]    Culture   Final    NO GROWTH 3 DAYS Performed at [facility], [place] [address], [place], [place] [address]    Report Status PENDING  Incomplete  Blood Culture (routine x 2)     Status: None (Preliminary result)   Collection Time: 11/10/19  3:00 PM   Specimen: BLOOD  Result Value Ref Range Status   Specimen Description   Final    BLOOD RIGHT ANTECUBITAL Performed at [facility], [address] [place], [place] [address]    Special Requests   Final    BOTTLES DRAWN AEROBIC AND ANAEROBIC Blood Culture adequate volume Performed at [facility], [address] [place], [place] [address]    Culture   Final    NO GROWTH 3 DAYS Performed at [facility], [place] [address] [place], [place] [address]    Report Status PENDING  Incomplete  SARS Coronavirus 2 Ag (30 min TAT) - Nasal Swab (BD Veritor Kit)     Status: None   Collection Time: 11/10/19  4:00 PM   Specimen: Nasal Swab (BD Veritor Kit)    Result Value Ref Range Status   SARS Coronavirus 2 Ag NEGATIVE NEGATIVE Final    Comment: (NOTE) SARS-CoV-2 antigen NOT DETECTED.  Negative results are presumptive.  Negative results do not preclude SARS-CoV-2 infection and should not be used as the sole basis for treatment or other patient management decisions, including infection  control decisions, particularly in the presence of clinical signs and  symptoms consistent with COVID-19, or in those  who have been in contact with the virus.  Negative results must be combined with clinical observations, patient history, and epidemiological information. The expected result is Negative. Fact Sheet for Patients: PodPark.tn Fact Sheet for Healthcare Providers: GiftContent.is This test is not yet approved or cleared by the [place] FDA and  has been authorized for detection and/or diagnosis of SARS-CoV-2 by FDA under an Emergency Use Authorization (EUA).  This EUA will remain in effect (meaning this test can be used) for the duration of  the COVID-19 de claration under Section 564(b)(1) of the Act, 21 U.S.C. section 360bbb-3(b)(1), unless the authorization is terminated or revoked sooner. Performed at [facility], [address] [place], [place] [address]   SARS Coronavirus 2 by RT PCR (hospital order, performed in [facility] hospital lab) Nasopharyngeal Nasopharyngeal Swab     Status: Abnormal   Collection Time: 11/10/19 11:14 PM   Specimen: Nasopharyngeal Swab  Result Value Ref Range Status   SARS Coronavirus 2 POSITIVE (A) NEGATIVE Final    Comment: RESULT CALLED TO, READ BACK BY AND VERIFIED WITH: MAYNARD,C,RN @ 0014 11/11/19 BY GWYN,P Performed at [facility], [address] [place], [place] [address]          Radiology Studies: Portable chest 1 View  Result Date: 11/11/2019 CLINICAL DATA:  Shortness of breath. COVID-19 infection.  EXAM: PORTABLE CHEST 1 VIEW COMPARISON:  Radiographs 11/10/2019. FINDINGS: 1417 hours. The heart size and mediastinal contours are stable without definite adenopathy. There are worsening peripheral ground-glass opacities in both lungs, left greater than right. There is no pneumothorax or significant pleural effusion. Probable loop recorder overlies the lower left chest. No acute osseous findings. IMPRESSION: Worsened peripheral ground-glass opacities in both lungs, left greater than right, most compatible with worsening COVID-19 pneumonia. Electronically Signed   By: Richardean Sale M.D.   On: 11/11/2019 14:26        Scheduled Meds: . amLODipine  10 mg Oral Daily  . apixaban  5 mg Oral BID  . vitamin C  500 mg Oral Daily  . aspirin EC  81 mg Oral Daily  . atorvastatin  5 mg Oral q1800  . dexamethasone (DECADRON) injection  6 mg Intravenous Q24H  . flecainide  50 mg Oral Q12H  . folic acid  1 mg Oral Daily  . insulin aspart  0-15 Units Subcutaneous Q4H  . insulin detemir  10 Units Subcutaneous BID  . Ipratropium-Albuterol  1 puff Inhalation Q6H  . metoprolol succinate  50 mg Oral Daily  . potassium chloride  40 mEq Oral BID  . thiamine  100 mg Oral Daily  . zinc sulfate  220 mg Oral Daily   Continuous Infusions: . remdesivir 100 mg in NS 100 mL 100 mg (11/13/19 1027)     LOS: 3 days    Time spent: 35 minutes    Little Ishikawa, MD [facility] Pager [phone]

## 2019-11-13 NOTE — Progress Notes (Signed)
Pt. States that he would like to update family. Reports that he has spoken with his son and wife.

## 2019-11-14 LAB — CBC WITH DIFFERENTIAL/PLATELET
Abs Immature Granulocytes: 0.07 10*3/uL (ref 0.00–0.07)
Basophils Absolute: 0 10*3/uL (ref 0.0–0.1)
Basophils Relative: 0 %
Eosinophils Absolute: 0 10*3/uL (ref 0.0–0.5)
Eosinophils Relative: 0 %
HCT: 41.1 % (ref 39.0–52.0)
Hemoglobin: 13.6 g/dL (ref 13.0–17.0)
Immature Granulocytes: 1 %
Lymphocytes Relative: 17 %
Lymphs Abs: 1.1 10*3/uL (ref 0.7–4.0)
MCH: 29.9 pg (ref 26.0–34.0)
MCHC: 33.1 g/dL (ref 30.0–36.0)
MCV: 90.3 fL (ref 80.0–100.0)
Monocytes Absolute: 0.4 10*3/uL (ref 0.1–1.0)
Monocytes Relative: 6 %
Neutro Abs: 5 10*3/uL (ref 1.7–7.7)
Neutrophils Relative %: 76 %
Platelets: 459 10*3/uL — ABNORMAL HIGH (ref 150–400)
RBC: 4.55 MIL/uL (ref 4.22–5.81)
RDW: 12.8 % (ref 11.5–15.5)
WBC: 6.7 10*3/uL (ref 4.0–10.5)
nRBC: 0 % (ref 0.0–0.2)

## 2019-11-14 LAB — COMPREHENSIVE METABOLIC PANEL
ALT: 63 U/L — ABNORMAL HIGH (ref 0–44)
AST: 49 U/L — ABNORMAL HIGH (ref 15–41)
Albumin: 3.2 g/dL — ABNORMAL LOW (ref 3.5–5.0)
Alkaline Phosphatase: 48 U/L (ref 38–126)
Anion gap: 12 (ref 5–15)
BUN: 23 mg/dL (ref 8–23)
CO2: 24 mmol/L (ref 22–32)
Calcium: 8.9 mg/dL (ref 8.9–10.3)
Chloride: 106 mmol/L (ref 98–111)
Creatinine, Ser: 0.72 mg/dL (ref 0.61–1.24)
GFR calc Af Amer: >60 mL/min (ref >60)
GFR calc non Af Amer: >60 mL/min (ref >60)
Glucose, Bld: 144 mg/dL — ABNORMAL HIGH (ref 70–99)
Potassium: 4 mmol/L (ref 3.5–5.1)
Sodium: 142 mmol/L (ref 135–145)
Total Bilirubin: 0.6 mg/dL (ref 0.3–1.2)
Total Protein: 6.8 g/dL (ref 6.5–8.1)

## 2019-11-14 LAB — GLUCOSE, CAPILLARY
Glucose-Capillary: 131 mg/dL — ABNORMAL HIGH (ref 70–99)
Glucose-Capillary: 137 mg/dL — ABNORMAL HIGH (ref 70–99)
Glucose-Capillary: 154 mg/dL — ABNORMAL HIGH (ref 70–99)
Glucose-Capillary: 247 mg/dL — ABNORMAL HIGH (ref 70–99)
Glucose-Capillary: 292 mg/dL — ABNORMAL HIGH (ref 70–99)

## 2019-11-14 LAB — C-REACTIVE PROTEIN: CRP: 2 mg/dL — ABNORMAL HIGH (ref <1.0)

## 2019-11-14 LAB — FERRITIN: Ferritin: 272 ng/mL (ref 24–336)

## 2019-11-14 LAB — D-DIMER, QUANTITATIVE: D-Dimer, Quant: 0.52 ug/mL-FEU — ABNORMAL HIGH (ref 0.00–0.50)

## 2019-11-14 NOTE — Progress Notes (Signed)
Ambulated patient in the hallway. Patient did 2 laps around the floor, O2 sat remained >94% RA.

## 2019-11-14 NOTE — Plan of Care (Signed)
  Problem: Activity: Goal: Risk for activity intolerance will decrease Outcome: Progressing   Problem: Education: Goal: Knowledge of risk factors and measures for prevention of condition will improve Outcome: Adequate for Discharge   Problem: Coping: Goal: Psychosocial and spiritual needs will be supported Outcome: Adequate for Discharge   Problem: Respiratory: Goal: Will maintain a patent airway Outcome: Adequate for Discharge Goal: Complications related to the disease process, condition or treatment will be avoided or minimized Outcome: Adequate for Discharge   Problem: Education: Goal: Knowledge of General Education information will improve Description: Including pain rating scale, medication(s)/side effects and non-pharmacologic comfort measures Outcome: Adequate for Discharge   Problem: Health Behavior/Discharge Planning: Goal: Ability to manage health-related needs will improve Outcome: Adequate for Discharge   Problem: Clinical Measurements: Goal: Ability to maintain clinical measurements within normal limits will improve Outcome: Adequate for Discharge Goal: Will remain free from infection Outcome: Adequate for Discharge Goal: Diagnostic test results will improve Outcome: Adequate for Discharge Goal: Respiratory complications will improve Outcome: Adequate for Discharge

## 2019-11-14 NOTE — Progress Notes (Signed)
PROGRESS NOTE    George Carr  LNL:[id number] DOB: 1955/12/13 DOA: 11/10/2019 PCP: Jefm Petty, MD    Brief Narrative:  63 year old gentleman with history of paroxysmal A. fib on Eliquis, type 2 diabetes on Metformin, diabetic retinopathy, essential hypertension hyperlipidemia and sleep apnea who presented to the emergency room with cough, fever, shortness of breath and fatigue for 4 days.  He does use CPAP at night and some supplemental oxygen. In the emergency room, he was found hypoxic, needed 6 L high flow nasal cannula oxygen to keep saturations more than 90%.  Admitted to the [facility] with Covid 19 viral pneumonia.   Subjective: No acute issues or events overnight, now on room air at rest, requiring oxygen with exertion given ongoing dyspnea.  Otherwise declines chest pain, nausea, vomiting, diarrhea, constipation, headache, fevers, chills.  Assessment & Plan:   Active Problems:   Acute respiratory failure with hypoxia (HCC)   Pneumonia due to COVID-19 virus   AF (paroxysmal atrial fibrillation) (HCC)   Diabetes mellitus type 2, uncontrolled, with complications (HCC)   Diabetic retinopathy ([place])   HLD (hyperlipidemia)   Obesity, diabetes, and hypertension syndrome (HCC)   Acute respiratory failure with hypoxemia, pneumonia due to COVID-19 virus, POA: Continue to monitor due to significant symptoms.  Still on supplemental oxygen with some clinical improvement today. SpO2: 93 % O2 Flow Rate (L/min): 1 L/min FiO2 (%): 98 % Continue chest physiotherapy, incentive spirometry, deep breathing exercises, sputum induction, mucolytic's and bronchodilators. Covid directed therapy with, steroids, dexamethasone day 3/10(stop 12/23) remdesivir, day 4/5 (stop 18th), actemra received 800 mg on 11/11/2019 antibiotics, not indicated Due to severity of symptoms, patient will need daily inflammatory markers, liver function test to monitor and direct COVID-19 therapies.   Advance activities. Recent Labs    11/11/19 1240 11/12/19 0240 11/13/19 0355 11/14/19 0420  DDIMER 0.46 0.35 0.48 0.52*  FERRITIN 297 292 311 272  LDH 268*  --   --   --   CRP 13.4* 9.1* 4.0* 2.0*   Obstructive sleep apnea:  Not using CPAP to avoid aerosolization..  May need additional oxygen at night -will need to be weaned back down aggressively each morning to ensure appropriate oxygen levels while awake.  Paroxysmal A. fib: Currently rate controlled in sinus rhythm.  On flecainide, Toprol.  Therapeutic on Eliquis.  Type 2 diabetes on Metformin at home: Hold Metformin.  On sliding scale insulin.  Add long-acting insulin with concomitant use of steroids.  Hyperlipidemia: On a statin that continued.  Hypertension: Blood pressure is stable.  Continue home medications including amlodipine.  Mild hypokalemia: Continue to follow a.m. labs   DVT prophylaxis: Eliquis Code Status: Full code Family Communication: Patient talking to the family Disposition Plan: Remains inpatient likely discharge the next 24 to 48 hours pending ongoing clinical improvement, completion of remdesivir.  Consultants:   None  Procedures:   None  Antimicrobials:   Remdesivir, 11/11/2019>>>>11/15/19  Objective: Vitals:   11/14/19 0044 11/14/19 0445 11/14/19 0800 11/14/19 1155  BP:  (!) 123/94    Pulse:  63    Resp:      Temp:  97.9 F (36.6 C) 97.7 F (36.5 C) 97.6 F (36.4 C)  TempSrc:  Oral Oral Oral  SpO2: 93% 93%    Weight:      Height:        Intake/Output Summary (Last 24 hours) at 11/14/2019 1231 Last data filed at 11/14/2019 1053 Gross per 24 hour  Intake 360 ml  Output  175 ml  Net 185 ml   Filed Weights   11/10/19 1238  Weight: 104.3 kg    Examination:  General:  Pleasantly resting in bed, No acute distress.  Currently on room air. HEENT:  Normocephalic atraumatic.  Sclerae nonicteric, noninjected.  Extraocular movements intact bilaterally. Neck:  Without mass or  deformity.  Trachea is midline. Lungs:  Clear to auscultate bilaterally without rhonchi, wheeze, or rales. Heart:  Regular rate and rhythm.  Without murmurs, rubs, or gallops. Abdomen:  Soft, nontender, nondistended.  Without guarding or rebound. Extremities: Without cyanosis, clubbing, edema, or obvious deformity. Vascular:  Dorsalis pedis and posterior tibial pulses palpable bilaterally. Skin:  Warm and dry, no erythema, no ulcerations.   Data Reviewed: I have personally reviewed following labs and imaging studies  CBC: Recent Labs  Lab 11/10/19 1441 11/11/19 1240 11/12/19 0240 11/13/19 0355 11/14/19 0420  WBC 7.3 6.7 7.7 6.9 6.7  NEUTROABS 6.1 6.0 6.4 5.4 5.0  HGB 13.1 14.0 13.3 13.4 13.6  HCT 38.6* 41.6 39.2 40.3 41.1  MCV 91.3 90.8 91.0 91.2 90.3  PLT 286 333 391 429* 704*   Basic Metabolic Panel: Recent Labs  Lab 11/10/19 1441 11/11/19 1240 11/12/19 0240 11/13/19 0355 11/14/19 0420  NA 139 143 142 145 142  K 3.0* 3.6 3.4* 3.7 4.0  CL 104 106 106 111 106  CO2 _0 GLUCOSE 124* 211* 157* 148* 144*  BUN _1 26* 23  CREATININE 0.79 0.82 0.79 0.85 0.72  CALCIUM 8.4* 9.2 9.3 8.8* 8.9  MG  --   --  2.1 1.7  --   PHOS  --   --  2.7 2.9  --    GFR: Estimated Creatinine Clearance: 118 mL/min (by C-G formula based on SCr of 0.72 mg/dL). Liver Function Tests: Recent Labs  Lab 11/10/19 1441 11/11/19 1240 11/12/19 0240 11/13/19 0355 11/14/19 0420  AST 34 37 33 36 49*  ALT _2 37 63*  ALKPHOS 48 51 44 45 48  BILITOT 0.8 0.6 0.5 0.6 0.6  PROT 7.0 7.7 7.3 6.9 6.8  ALBUMIN 3.3* 3.5 3.3* 3.2* 3.2*   No results for input(s): LIPASE, AMYLASE in the last 168 hours. No results for input(s): AMMONIA in the last 168 hours. Coagulation Profile: No results for input(s): INR, PROTIME in the last 168 hours. Cardiac Enzymes: No results for input(s): CKTOTAL, CKMB, CKMBINDEX, TROPONINI in the last 168 hours. BNP (last 3 results) No results for  input(s): PROBNP in the last 8760 hours. HbA1C: Recent Labs    11/11/19 1240  HGBA1C 6.4*   CBG: Recent Labs  Lab 11/13/19 1526 11/13/19 1916 11/14/19 0035 11/14/19 0446 11/14/19 0719  GLUCAP 275* 379* 154* 137* 131*   Lipid Profile: Recent Labs    11/11/19 1240  CHOL 97  HDL 37*  LDLCALC 45  TRIG 75  CHOLHDL 2.6   Thyroid Function Tests: No results for input(s): TSH, T4TOTAL, FREET4, T3FREE, THYROIDAB in the last 72 hours. Anemia Panel: Recent Labs    11/13/19 0355 11/14/19 0420  FERRITIN 311 272   Sepsis Labs: Recent Labs  Lab 11/10/19 1440 11/10/19 1441 11/11/19 1240  PROCALCITON  --  <0.10 <0.10  LATICACIDVEN 1.6  --   --     Recent Results (from the past 240 hour(s))  SARS CORONAVIRUS 2 (TAT 6-24 HRS) Nasopharyngeal Nasopharyngeal Swab     Status: Abnormal   Collection Time: 11/10/19  1:57 PM   Specimen: Nasopharyngeal Swab  Result Value Ref Range Status   SARS Coronavirus 2 POSITIVE (A) NEGATIVE Final    Comment: RESULT CALLED TO, READ BACK BY AND VERIFIED WITH: RN V POWELL _0  11/11/19 BY S GEZAHEGN (NOTE) SARS-CoV-2 target nucleic acids are DETECTED. The SARS-CoV-2 RNA is generally detectable in upper and lower respiratory specimens during the acute phase of infection. Positive results are indicative of the presence of SARS-CoV-2 RNA. Clinical correlation with patient history and other diagnostic information is  necessary to determine patient infection status. Positive results do not rule out bacterial infection or co-infection with other viruses.  The expected result is Negative. Fact Sheet for Patients: SugarRoll.be Fact Sheet for Healthcare Providers: https://www.woods-mathews.com/ This test is not yet approved or cleared by the [place] FDA and  has been authorized for detection and/or diagnosis of SARS-CoV-2 by FDA under an Emergency Use Authorization (EUA). This EUA will remain  in  effect (meaning this test can be used) fo r the duration of the COVID-19 declaration under Section 564(b)(1) of the Act, 21 U.S.C. section 360bbb-3(b)(1), unless the authorization is terminated or revoked sooner. Performed at [facility], [place] [address], [place], [place] [address]   Blood Culture (routine x 2)     Status: None (Preliminary result)   Collection Time: 11/10/19  3:00 PM   Specimen: BLOOD  Result Value Ref Range Status   Specimen Description   Final    BLOOD LEFT ANTECUBITAL Performed at [facility], [address] [place], [place] [address]    Special Requests   Final    BOTTLES DRAWN AEROBIC AND ANAEROBIC Blood Culture results may not be optimal due to an excessive volume of blood received in culture bottles Performed at [facility], [address] [place], [place] [address]    Culture   Final    NO GROWTH 4 DAYS Performed at [facility], [place] [address] [place], [place] [address]    Report Status PENDING  Incomplete  Blood Culture (routine x 2)     Status: None (Preliminary result)   Collection Time: 11/10/19  3:00 PM   Specimen: BLOOD  Result Value Ref Range Status   Specimen Description   Final    BLOOD RIGHT ANTECUBITAL Performed at [facility], [address] [place], [place] [address]    Special Requests   Final    BOTTLES DRAWN AEROBIC AND ANAEROBIC Blood Culture adequate volume Performed at [facility], [address] [place], [place] [address]    Culture   Final    NO GROWTH 4 DAYS Performed at [facility], [place] [address] [place], [place] [address]    Report Status PENDING  Incomplete  SARS Coronavirus 2 Ag (30 min TAT) - Nasal Swab (BD Veritor Kit)     Status: None   Collection Time: 11/10/19  4:00 PM   Specimen: Nasal Swab (BD Veritor Kit)  Result Value Ref Range Status   SARS Coronavirus 2 Ag NEGATIVE NEGATIVE Final    Comment: (NOTE) SARS-CoV-2 antigen NOT DETECTED.    Negative results are presumptive.  Negative results do not preclude SARS-CoV-2 infection and should not be used as the sole basis for treatment or other patient management decisions, including infection  control decisions, particularly in the presence of clinical signs and  symptoms consistent with COVID-19, or in those who have been in contact with the virus.  Negative results must be combined with  clinical observations, patient history, and epidemiological information. The expected result is Negative. Fact Sheet for Patients: PodPark.tn Fact Sheet for Healthcare Providers: GiftContent.is This test is not yet approved or cleared by the [place] FDA and  has been authorized for detection and/or diagnosis of SARS-CoV-2 by FDA under an Emergency Use Authorization (EUA).  This EUA will remain in effect (meaning this test can be used) for the duration of  the COVID-19 de claration under Section 564(b)(1) of the Act, 21 U.S.C. section 360bbb-3(b)(1), unless the authorization is terminated or revoked sooner. Performed at [facility], [address] [place], [place] [address]   SARS Coronavirus 2 by RT PCR (hospital order, performed in [facility] hospital lab) Nasopharyngeal Nasopharyngeal Swab     Status: Abnormal   Collection Time: 11/10/19 11:14 PM   Specimen: Nasopharyngeal Swab  Result Value Ref Range Status   SARS Coronavirus 2 POSITIVE (A) NEGATIVE Final    Comment: RESULT CALLED TO, READ BACK BY AND VERIFIED WITH: MAYNARD,C,RN @ 0014 11/11/19 BY GWYN,P Performed at [facility], [address], [place], [place] [address]          Radiology Studies: No results found.      Scheduled Meds: . amLODipine  10 mg Oral Daily  . apixaban  5 mg Oral BID  . vitamin C  500 mg Oral Daily  . aspirin EC  81 mg Oral Daily  . atorvastatin  5 mg Oral q1800  . dexamethasone (DECADRON) injection  6  mg Intravenous Q24H  . flecainide  50 mg Oral Q12H  . folic acid  1 mg Oral Daily  . insulin aspart  0-15 Units Subcutaneous Q4H  . insulin detemir  10 Units Subcutaneous BID  . Ipratropium-Albuterol  1 puff Inhalation Q6H  . metoprolol succinate  50 mg Oral Daily  . thiamine  100 mg Oral Daily  . zinc sulfate  220 mg Oral Daily   Continuous Infusions: . remdesivir 100 mg in NS 100 mL 100 mg (11/14/19 0853)     LOS: 4 days    Time spent: 35 minutes    Little Ishikawa, MD [facility] Pager [phone]

## 2019-11-15 LAB — CBC WITH DIFFERENTIAL/PLATELET
Abs Immature Granulocytes: 0.14 10*3/uL — ABNORMAL HIGH (ref 0.00–0.07)
Basophils Absolute: 0.1 10*3/uL (ref 0.0–0.1)
Basophils Relative: 1 %
Eosinophils Absolute: 0 10*3/uL (ref 0.0–0.5)
Eosinophils Relative: 0 %
HCT: 39.1 % (ref 39.0–52.0)
Hemoglobin: 13.2 g/dL (ref 13.0–17.0)
Immature Granulocytes: 1 %
Lymphocytes Relative: 17 %
Lymphs Abs: 1.7 10*3/uL (ref 0.7–4.0)
MCH: 30.2 pg (ref 26.0–34.0)
MCHC: 33.8 g/dL (ref 30.0–36.0)
MCV: 89.5 fL (ref 80.0–100.0)
Monocytes Absolute: 0.6 10*3/uL (ref 0.1–1.0)
Monocytes Relative: 6 %
Neutro Abs: 7.5 10*3/uL (ref 1.7–7.7)
Neutrophils Relative %: 75 %
Platelets: 484 10*3/uL — ABNORMAL HIGH (ref 150–400)
RBC: 4.37 MIL/uL (ref 4.22–5.81)
RDW: 12.5 % (ref 11.5–15.5)
WBC: 10 10*3/uL (ref 4.0–10.5)
nRBC: 0 % (ref 0.0–0.2)

## 2019-11-15 LAB — CULTURE, BLOOD (ROUTINE X 2)
Culture: NO GROWTH
Culture: NO GROWTH
Special Requests: ADEQUATE

## 2019-11-15 LAB — COMPREHENSIVE METABOLIC PANEL
ALT: 62 U/L — ABNORMAL HIGH (ref 0–44)
AST: 37 U/L (ref 15–41)
Albumin: 3.3 g/dL — ABNORMAL LOW (ref 3.5–5.0)
Alkaline Phosphatase: 44 U/L (ref 38–126)
Anion gap: 10 (ref 5–15)
BUN: 22 mg/dL (ref 8–23)
CO2: 26 mmol/L (ref 22–32)
Calcium: 8.8 mg/dL — ABNORMAL LOW (ref 8.9–10.3)
Chloride: 104 mmol/L (ref 98–111)
Creatinine, Ser: 0.72 mg/dL (ref 0.61–1.24)
GFR calc Af Amer: >60 mL/min (ref >60)
GFR calc non Af Amer: >60 mL/min (ref >60)
Glucose, Bld: 133 mg/dL — ABNORMAL HIGH (ref 70–99)
Potassium: 3.7 mmol/L (ref 3.5–5.1)
Sodium: 140 mmol/L (ref 135–145)
Total Bilirubin: 0.7 mg/dL (ref 0.3–1.2)
Total Protein: 6.5 g/dL (ref 6.5–8.1)

## 2019-11-15 LAB — GLUCOSE, CAPILLARY
Glucose-Capillary: 109 mg/dL — ABNORMAL HIGH (ref 70–99)
Glucose-Capillary: 132 mg/dL — ABNORMAL HIGH (ref 70–99)
Glucose-Capillary: 189 mg/dL — ABNORMAL HIGH (ref 70–99)
Glucose-Capillary: 197 mg/dL — ABNORMAL HIGH (ref 70–99)
Glucose-Capillary: 263 mg/dL — ABNORMAL HIGH (ref 70–99)

## 2019-11-15 LAB — D-DIMER, QUANTITATIVE: D-Dimer, Quant: 0.41 ug/mL-FEU (ref 0.00–0.50)

## 2019-11-15 LAB — FERRITIN: Ferritin: 269 ng/mL (ref 24–336)

## 2019-11-15 LAB — C-REACTIVE PROTEIN: CRP: 1 mg/dL — ABNORMAL HIGH (ref <1.0)

## 2019-11-15 MED ORDER — PREDNISONE 10 MG PO TABS: ORAL_TABLET | ORAL | 0 refills | Status: AC

## 2019-11-15 NOTE — Discharge Summary (Signed)
Physician Discharge Summary  George Carr KCL:[id number] DOB: 27-Feb-1956 DOA: 11/10/2019  PCP: Jefm Petty, MD  Admit date: 11/10/2019 Discharge date: 11/15/2019  Admitted From: Home Disposition: Home  Recommendations for Outpatient Follow-up:  1. Follow up with PCP in 1-2 weeks 2. Please obtain BMP/CBC in one week  Discharge Condition: Stable CODE STATUS: Full Diet recommendation: As tolerated  Brief/Interim Summary: 63 year old gentleman with history of paroxysmal A. fib on Eliquis, type 2 diabetes on Metformin, diabetic retinopathy, essential hypertension hyperlipidemia and sleep apnea who presented to the emergency room with cough, fever, shortness of breath and fatigue for 4 days.  He does use CPAP at night and some supplemental oxygen. In the emergency room, he was found hypoxic, needed 6 L high flow nasal cannula oxygen to keep saturations more than 90%.  Admitted to the [facility] with Covid 19 viral pneumonia.  Patient admitted as above with acute hypoxic respiratory failure in the setting of COVID-19 pneumonia.  Patient improved quite rapidly after administration of remdesivir, dexamethasone, Actemra.  Patient did not require antibiotics given no overt bacterial infections.  Patient now on room air over 24 hours ambulating without hypoxia or dyspnea.  Have requested patient's wife to be retested for Covid as she was tested the day he was diagnosed, likely tested much too early, we have recommended the patient quarantine at home until wife's repeat Covid swab has resulted.  If patient's wife is truly negative he would need to remain quarantine for at least another 14 days from her and other people.  If she is positive they could quarantine together in the house and would not need to quarantine from each other but would need to stay home for at least another 14 days.  At this time patient feels quite well, agreeable for discharge, will discontinue on remainder of  steroids with close follow-up with PCP in the next 1 to 2 weeks either via telemetry visit or in person visit.  Discharge Diagnoses:  Active Problems:   Acute respiratory failure with hypoxia (HCC)   Pneumonia due to COVID-19 virus   AF (paroxysmal atrial fibrillation) (HCC)   Diabetes mellitus type 2, uncontrolled, with complications (HCC)   Diabetic retinopathy ([place])   HLD (hyperlipidemia)   Obesity, diabetes, and hypertension syndrome (HCC)   Acute respiratory failure with hypoxemia, pneumonia due to COVID-19 virus, POA: Continue to monitor due to significant symptoms.  Still on supplemental oxygen with some clinical improvement today. SpO2: 93 % on Room Air Continue chest physiotherapy, incentive spirometry, deep breathing exercises, sputum induction, mucolytic's and bronchodilators. Covid directed therapy with, steroids, remdesivir, day 5/5 (stop 18th), actemra received 800 mg on 11/11/2019 antibiotics, not indicated Recent Labs (last 2 labs)         Recent Labs    11/11/19 1240 11/12/19 0240 11/13/19 0355 11/14/19 0420  DDIMER 0.46 0.35 0.48 0.52*  FERRITIN 297 292 311 272  LDH 268*  --   --   --   CRP 13.4* 9.1* 4.0* 2.0*     Obstructive sleep apnea:  Resume CPAP use at home  Paroxysmal A. fib: Currently rate controlled in sinus rhythm.  On flecainide, Toprol.  Therapeutic on Eliquis.  Type 2 diabetes on Metformin at home:  Resume home medications.  Hyperlipidemia: On a statin that continued.  Hypertension: Blood pressure is stable.  Continue home medications including amlodipine.  Mild hypokalemia, resolved: Follow with PCP  Discharge Instructions  Discharge Instructions    Call MD for:  difficulty breathing, headache  or visual disturbances   Complete by: As directed    Call MD for:  extreme fatigue   Complete by: As directed    Call MD for:  hives   Complete by: As directed    Call MD for:  persistant dizziness or light-headedness   Complete by:  As directed    Call MD for:  persistant nausea and vomiting   Complete by: As directed    Call MD for:  severe uncontrolled pain   Complete by: As directed    Call MD for:  temperature >100.4   Complete by: As directed    Diet - low sodium heart healthy   Complete by: As directed    Increase activity slowly   Complete by: As directed      Allergies as of 11/15/2019      Reactions   Penicillins Hives   Has patient had a PCN reaction causing immediate rash, facial/tongue/throat swelling, SOB or lightheadedness with hypotension: Yes Has patient had a PCN reaction causing severe rash involving mucus membranes or skin necrosis: No Has patient had a PCN reaction that required hospitalization No Has patient had a PCN reaction occurring within the last 10 years: No If all of the above answers are "NO", then may proceed with Cephalosporin use.      Medication List    STOP taking these medications   ibuprofen 200 MG tablet Commonly known as: ADVIL     TAKE these medications   amLODipine 10 MG tablet Commonly known as: NORVASC TAKE 1 TABLET(10 MG) BY MOUTH DAILY What changed: See the new instructions.   atorvastatin 10 MG tablet Commonly known as: LIPITOR TAKE 1/2 TABLET(5 MG) BY MOUTH DAILY What changed: See the new instructions.   benzonatate 200 MG capsule Commonly known as: TESSALON Take 200 mg by mouth 3 (three) times daily as needed for cough.   Eliquis 5 MG Tabs tablet Generic drug: apixaban TAKE 1 TABLET(5 MG) BY MOUTH TWICE DAILY What changed: See the new instructions.   flecainide 50 MG tablet Commonly known as: TAMBOCOR TAKE 1 TABLET BY MOUTH TWICE DAILY   fluticasone 50 MCG/ACT nasal spray Commonly known as: FLONASE Place 1 spray into the nose daily as needed.   guaiFENesin 600 MG 12 hr tablet Commonly known as: MUCINEX Take 600 mg by mouth 2 (two) times daily as needed for cough.   lisinopril 40 MG tablet Commonly known as: ZESTRIL TAKE 1 TABLET(40  MG) BY MOUTH DAILY   loratadine 10 MG tablet Commonly known as: CLARITIN Take 10 mg by mouth daily as needed for allergies.   metFORMIN 500 MG 24 hr tablet Commonly known as: GLUCOPHAGE-XR Take 1,000 mg by mouth 2 (two) times daily.   metoprolol succinate 50 MG 24 hr tablet Commonly known as: TOPROL-XL Take 1 tablet (50 mg total) by mouth daily. Take with or immediately following a meal.   ONE TOUCH ULTRA TEST test strip Generic drug: glucose blood CHECK BLOOD SUGAR ONCE D   Ozempic (0.25 or 0.5 MG/DOSE) 2 MG/1.5ML Sopn Generic drug: Semaglutide(0.25 or 0.5MG/DOS) Inject 1.8 mg into the skin every 14 (fourteen) days.   predniSONE 10 MG tablet Commonly known as: DELTASONE Take 2 tablets (20 mg total) by mouth daily for 3 days, THEN 1 tablet (10 mg total) daily for 3 days. Start taking on: November 15, 2019   sildenafil 20 MG tablet Commonly known as: REVATIO Take 80 mg by mouth as needed (ED). Take as directed  Allergies  Allergen Reactions  . Penicillins Hives    Has patient had a PCN reaction causing immediate rash, facial/tongue/throat swelling, SOB or lightheadedness with hypotension: Yes Has patient had a PCN reaction causing severe rash involving mucus membranes or skin necrosis: No Has patient had a PCN reaction that required hospitalization No Has patient had a PCN reaction occurring within the last 10 years: No If all of the above answers are "NO", then may proceed with Cephalosporin use.     Procedures/Studies: Portable chest 1 View  Result Date: 11/11/2019 CLINICAL DATA:  Shortness of breath. COVID-19 infection. EXAM: PORTABLE CHEST 1 VIEW COMPARISON:  Radiographs 11/10/2019. FINDINGS: 1417 hours. The heart size and mediastinal contours are stable without definite adenopathy. There are worsening peripheral ground-glass opacities in both lungs, left greater than right. There is no pneumothorax or significant pleural effusion. Probable loop recorder  overlies the lower left chest. No acute osseous findings. IMPRESSION: Worsened peripheral ground-glass opacities in both lungs, left greater than right, most compatible with worsening COVID-19 pneumonia. Electronically Signed   By: Richardean Sale M.D.   On: 11/11/2019 14:26   DG Chest Portable 1 View  Result Date: 11/10/2019 CLINICAL DATA:  Short of breath for 1 week with fever. Exposure to COVID 1910 days ago. EXAM: PORTABLE CHEST 1 VIEW COMPARISON:  None. FINDINGS: There are hazy airspace opacities which are noted in the peripheral left mid to upper and lower lung, and in the right mid and lower lung. No pleural effusion. No pneumothorax. Cardiac silhouette is normal in size. No mediastinal or hilar masses. Skeletal structures are grossly intact. IMPRESSION: 1. Bilateral hazy mid to lower lung peripheral airspace opacities, greater on the left, with a pattern consistent with COVID-19 pneumonia. Electronically Signed   By: Lajean Manes M.D.   On: 11/10/2019 13:57   CUP PACEART REMOTE DEVICE CHECK  Result Date: 10/28/2019 [facility] summary report received. Battery status OK. Normal device function. No new symptom episodes, tachy episodes, brady, or pause episodes. No new AF episodes. Monthly summary reports and ROV/PRN   Subjective: No acute issues or events overnight, feeling quite well, ambulating without hypoxia denies chest pain, shortness of breath, nausea, vomiting, diarrhea, constipation, headache, fevers, chills.   Discharge Exam: Vitals:   11/15/19 0430 11/15/19 0440  BP:  (!) 173/88  Pulse:  68  Resp: 18   Temp:    SpO2:  95%   Vitals:   11/14/19 1900 11/14/19 2000 11/15/19 0430 11/15/19 0440  BP:    (!) 173/88  Pulse: 72 69  68  Resp:  18 18   Temp:  98.2 F (36.8 C)    TempSrc:  Oral    SpO2: 98% 94%  95%  Weight:      Height:        General:  Pleasantly resting in bed, No acute distress. HEENT:  Normocephalic atraumatic.  Sclerae nonicteric, noninjected.   Extraocular movements intact bilaterally. Neck:  Without mass or deformity.  Trachea is midline. Lungs:  Clear to auscultate bilaterally without rhonchi, wheeze, or rales. Heart:  Regular rate and rhythm.  Without murmurs, rubs, or gallops. Abdomen:  Soft, nontender, nondistended.  Without guarding or rebound. Extremities: Without cyanosis, clubbing, edema, or obvious deformity. Vascular:  Dorsalis pedis and posterior tibial pulses palpable bilaterally. Skin:  Warm and dry, no erythema, no ulcerations.   The results of significant diagnostics from this hospitalization (including imaging, microbiology, ancillary and laboratory) are listed below for reference.     Microbiology: Recent Results (  from the past 240 hour(s))  SARS CORONAVIRUS 2 (TAT 6-24 HRS) Nasopharyngeal Nasopharyngeal Swab     Status: Abnormal   Collection Time: 11/10/19  1:57 PM   Specimen: Nasopharyngeal Swab  Result Value Ref Range Status   SARS Coronavirus 2 POSITIVE (A) NEGATIVE Final    Comment: RESULT CALLED TO, READ BACK BY AND VERIFIED WITH: RN V POWELL @0109  11/11/19 BY S GEZAHEGN (NOTE) SARS-CoV-2 target nucleic acids are DETECTED. The SARS-CoV-2 RNA is generally detectable in upper and lower respiratory specimens during the acute phase of infection. Positive results are indicative of the presence of SARS-CoV-2 RNA. Clinical correlation with patient history and other diagnostic information is  necessary to determine patient infection status. Positive results do not rule out bacterial infection or co-infection with other viruses.  The expected result is Negative. Fact Sheet for Patients: SugarRoll.be Fact Sheet for Healthcare Providers: https://www.woods-mathews.com/ This test is not yet approved or cleared by the [place] FDA and  has been authorized for detection and/or diagnosis of SARS-CoV-2 by FDA under an Emergency Use Authorization (EUA). This EUA will  remain  in effect (meaning this test can be used) fo r the duration of the COVID-19 declaration under Section 564(b)(1) of the Act, 21 U.S.C. section 360bbb-3(b)(1), unless the authorization is terminated or revoked sooner. Performed at [facility], [place] [address], [place], [place] [address]   Blood Culture (routine x 2)     Status: None (Preliminary result)   Collection Time: 11/10/19  3:00 PM   Specimen: BLOOD  Result Value Ref Range Status   Specimen Description   Final    BLOOD LEFT ANTECUBITAL Performed at [facility], [address] [place], [place] [address]    Special Requests   Final    BOTTLES DRAWN AEROBIC AND ANAEROBIC Blood Culture results may not be optimal due to an excessive volume of blood received in culture bottles Performed at [facility], [address] [place], [place] [address]    Culture   Final    NO GROWTH 4 DAYS Performed at [facility], [place] [address] [place], [place] [address]    Report Status PENDING  Incomplete  Blood Culture (routine x 2)     Status: None (Preliminary result)   Collection Time: 11/10/19  3:00 PM   Specimen: BLOOD  Result Value Ref Range Status   Specimen Description   Final    BLOOD RIGHT ANTECUBITAL Performed at [facility], [address] [place], [place] [address]    Special Requests   Final    BOTTLES DRAWN AEROBIC AND ANAEROBIC Blood Culture adequate volume Performed at [facility], [address] [place], [place] [address]    Culture   Final    NO GROWTH 4 DAYS Performed at [facility], [place] [address], [place], [place] [address]    Report Status PENDING  Incomplete  SARS Coronavirus 2 Ag (30 min TAT) - Nasal Swab (BD Veritor Kit)     Status: None   Collection Time: 11/10/19  4:00 PM   Specimen: Nasal Swab (BD Veritor Kit)  Result Value Ref Range Status   SARS Coronavirus 2 Ag NEGATIVE NEGATIVE Final    Comment: (NOTE) SARS-CoV-2 antigen NOT  DETECTED.  Negative results are presumptive.  Negative results do not preclude SARS-CoV-2 infection and should not be used as the sole basis for treatment or other patient management decisions, including infection  control decisions, particularly in the presence of clinical signs and  symptoms consistent with COVID-19, or in those who have been in contact with the virus.  Negative results must be combined with clinical observations, patient history, and epidemiological information. The expected result is Negative. Fact Sheet for Patients: PodPark.tn Fact Sheet for Healthcare Providers: GiftContent.is This test is not yet approved or cleared by the [place] FDA and  has been authorized for detection and/or diagnosis of SARS-CoV-2 by FDA under an Emergency Use Authorization (EUA).  This EUA will remain in effect (meaning this test can be used) for the duration of  the COVID-19 de claration under Section 564(b)(1) of the Act, 21 U.S.C. section 360bbb-3(b)(1), unless the authorization is terminated or revoked sooner. Performed at [facility], [address] [place], [place] [address]   SARS Coronavirus 2 by RT PCR (hospital order, performed in [place] hospital lab) Nasopharyngeal Nasopharyngeal Swab     Status: Abnormal   Collection Time: 11/10/19 11:14 PM   Specimen: Nasopharyngeal Swab  Result Value Ref Range Status   SARS Coronavirus 2 POSITIVE (A) NEGATIVE Final    Comment: RESULT CALLED TO, READ BACK BY AND VERIFIED WITH: MAYNARD,C,RN @ 0014 11/11/19 BY GWYN,P Performed at [facility], [address] [place], [place] [address]      Labs: BNP (last 3 results) Recent Labs    11/11/19 1240  BNP 74.1   Basic Metabolic Panel: Recent Labs  Lab 11/11/19 1240 11/12/19 0240 11/13/19 0355 11/14/19 0420 11/15/19 0332  NA 143 142 145 142 140  K 3.6 3.4* 3.7 4.0 3.7  CL 106 106 111 106  104  CO2 23 26 23 24 26   GLUCOSE 211* 157* 148* 144* 133*  BUN 17 22 26* 23 22  CREATININE 0.82 0.79 0.85 0.72 0.72  CALCIUM 9.2 9.3 8.8* 8.9 8.8*  MG  --  2.1 1.7  --   --   PHOS  --  2.7 2.9  --   --    Liver Function Tests: Recent Labs  Lab 11/11/19 1240 11/12/19 0240 11/13/19 0355 11/14/19 0420 11/15/19 0332  AST 37 33 36 49* 37  ALT 31 31 37 63* 62*  ALKPHOS 51 44 45 48 44  BILITOT 0.6 0.5 0.6 0.6 0.7  PROT 7.7 7.3 6.9 6.8 6.5  ALBUMIN 3.5 3.3* 3.2* 3.2* 3.3*   No results for input(s): LIPASE, AMYLASE in the last 168 hours. No results for input(s): AMMONIA in the last 168 hours. CBC: Recent Labs  Lab 11/11/19 1240 11/12/19 0240 11/13/19 0355 11/14/19 0420 11/15/19 0332  WBC 6.7 7.7 6.9 6.7 10.0  NEUTROABS 6.0 6.4 5.4 5.0 7.5  HGB 14.0 13.3 13.4 13.6 13.2  HCT 41.6 39.2 40.3 41.1 39.1  MCV 90.8 91.0 91.2 90.3 89.5  PLT 333 391 429* 459* 484*   Cardiac Enzymes: No results for input(s): CKTOTAL, CKMB, CKMBINDEX, TROPONINI in the last 168 hours. BNP: Invalid input(s): POCBNP CBG: Recent Labs  Lab 11/14/19 0719 11/14/19 1142 11/14/19 1558 11/15/19 0032 11/15/19 0434  GLUCAP 131* 247* 292* 197* 132*   D-Dimer Recent Labs    11/14/19 0420 11/15/19 0332  DDIMER 0.52* 0.41   Hgb A1c No results for input(s): HGBA1C in the last 72 hours. Lipid Profile No results for input(s): CHOL, HDL, LDLCALC, TRIG, CHOLHDL, LDLDIRECT in the last 72 hours. Thyroid function studies No results for input(s): TSH, T4TOTAL, T3FREE, THYROIDAB in the last 72 hours.  Invalid input(s): FREET3  Anemia work up Recent Labs    11/13/19 0355 11/14/19 0420  FERRITIN 311 272   Urinalysis No results found for: COLORURINE, APPEARANCEUR, LABSPEC, [place], GLUCOSEU, HGBUR, BILIRUBINUR, KETONESUR, PROTEINUR, UROBILINOGEN, NITRITE, LEUKOCYTESUR Sepsis Labs Invalid input(s): PROCALCITONIN,  WBC,  LACTICIDVEN Microbiology Recent Results (from the past 240 hour(s))  SARS  CORONAVIRUS 2 (TAT 6-24 HRS) Nasopharyngeal Nasopharyngeal Swab     Status: Abnormal   Collection Time: 11/10/19  1:57 PM   Specimen: Nasopharyngeal Swab  Result Value Ref Range Status   SARS Coronavirus 2 POSITIVE (A) NEGATIVE Final    Comment: RESULT CALLED TO, READ BACK BY AND VERIFIED WITH: RN V POWELL @0109  11/11/19 BY S GEZAHEGN (NOTE) SARS-CoV-2 target nucleic acids are DETECTED. The SARS-CoV-2 RNA is generally detectable in upper and lower respiratory specimens during the acute phase of infection. Positive results are indicative of the presence of SARS-CoV-2 RNA. Clinical correlation with patient history and other diagnostic information is  necessary to determine patient infection status. Positive results do not rule out bacterial infection or co-infection with other viruses.  The expected result is Negative. Fact Sheet for Patients: SugarRoll.be Fact Sheet for Healthcare Providers: https://www.woods-mathews.com/ This test is not yet approved or cleared by the [place] FDA and  has been authorized for detection and/or diagnosis of SARS-CoV-2 by FDA under an Emergency Use Authorization (EUA). This EUA will remain  in effect (meaning this test can be used) fo r the duration of the COVID-19 declaration under Section 564(b)(1) of the Act, 21 U.S.C. section 360bbb-3(b)(1), unless the authorization is terminated or revoked sooner. Performed at [facility], [place] [address], [place], [place] [address]   Blood Culture (routine x 2)     Status: None (Preliminary result)   Collection Time: 11/10/19  3:00 PM   Specimen: BLOOD  Result Value Ref Range Status   Specimen Description   Final    BLOOD LEFT ANTECUBITAL Performed at [facility], [address] [place], [place] [address]    Special Requests   Final    BOTTLES DRAWN AEROBIC AND ANAEROBIC Blood Culture results may not be optimal due to an excessive volume of  blood received in culture bottles Performed at [facility], [address] [place], [place] [address]    Culture   Final    NO GROWTH 4 DAYS Performed at [facility], [place] [address] [place], [place] [address]    Report Status PENDING  Incomplete  Blood Culture (routine x 2)     Status: None (Preliminary result)   Collection Time: 11/10/19  3:00 PM   Specimen: BLOOD  Result Value Ref Range Status   Specimen Description   Final    BLOOD RIGHT ANTECUBITAL Performed at [facility], [address] [place], [place] [address]    Special Requests   Final    BOTTLES DRAWN AEROBIC AND ANAEROBIC Blood Culture adequate volume Performed at [facility], [address] [place], [place] [address]    Culture   Final    NO GROWTH 4 DAYS Performed at [facility], [place] [address], [place], [place] [address]    Report Status PENDING  Incomplete  SARS Coronavirus 2 Ag (30 min TAT) - Nasal Swab (BD Veritor Kit)     Status: None   Collection Time: 11/10/19  4:00 PM   Specimen: Nasal Swab (BD Veritor Kit)  Result Value Ref Range Status  SARS Coronavirus 2 Ag NEGATIVE NEGATIVE Final    Comment: (NOTE) SARS-CoV-2 antigen NOT DETECTED.  Negative results are presumptive.  Negative results do not preclude SARS-CoV-2 infection and should not be used as the sole basis for treatment or other patient management decisions, including infection  control decisions, particularly in the presence of clinical signs and  symptoms consistent with COVID-19, or in those who have been in contact with the virus.  Negative results must be combined with clinical observations, patient history, and epidemiological information. The expected result is Negative. Fact Sheet for Patients: PodPark.tn Fact Sheet for Healthcare Providers: GiftContent.is This test is not yet approved or cleared by the [place] FDA and   has been authorized for detection and/or diagnosis of SARS-CoV-2 by FDA under an Emergency Use Authorization (EUA).  This EUA will remain in effect (meaning this test can be used) for the duration of  the COVID-19 de claration under Section 564(b)(1) of the Act, 21 U.S.C. section 360bbb-3(b)(1), unless the authorization is terminated or revoked sooner. Performed at [facility], [address] [place], [place] [address]   SARS Coronavirus 2 by RT PCR (hospital order, performed in [facility] hospital lab) Nasopharyngeal Nasopharyngeal Swab     Status: Abnormal   Collection Time: 11/10/19 11:14 PM   Specimen: Nasopharyngeal Swab  Result Value Ref Range Status   SARS Coronavirus 2 POSITIVE (A) NEGATIVE Final    Comment: RESULT CALLED TO, READ BACK BY AND VERIFIED WITH: MAYNARD,C,RN @ 0014 11/11/19 BY GWYN,P Performed at [facility], [address] [place], [place] [address]      Time coordinating discharge: Over 30 minutes  SIGNED:   Little Ishikawa, DO [facility] 11/15/2019, 7:48 AM Pager   If 7PM-7AM, please contact night-coverage [email or web address] Password [email or web address]

## 2019-11-15 NOTE — TOC Initial Note (Signed)
Transition of Care [facility]) - Initial/Assessment Note    Patient Details  Name: George Carr MRN: [id number] Date of Birth: 1956-11-06  Transition [facility]) CM/SW Contact:    Ninfa Meeker, RN Phone Number: 11/15/2019, 10:48 AM  Clinical Narrative:     63 yr old gentleman admitted and treated for COVID 19 pneumonia. Patient has improved and will discharge home, no needs identified.                    Patient Goals and CMS Choice        Expected Discharge Plan and Services           Expected Discharge Date: 11/15/19                                    Prior Living Arrangements/Services                       Activities of Daily Living Home Assistive Devices/Equipment: None ADL Screening (condition at time of admission) Patient's cognitive ability adequate to safely complete daily activities?: Yes Is the patient deaf or have difficulty hearing?: No Does the patient have difficulty seeing, even when wearing glasses/contacts?: No Does the patient have difficulty concentrating, remembering, or making decisions?: No Patient able to express need for assistance with ADLs?: Yes Does the patient have difficulty dressing or bathing?: No Independently performs ADLs?: Yes (appropriate for developmental age) Does the patient have difficulty walking or climbing stairs?: No Weakness of Legs: None Weakness of Arms/Hands: None  Permission Sought/Granted                  Emotional Assessment              Admission diagnosis:  Elevated temperature [R50.9] Acute respiratory failure with hypoxia ([place]) [J96.01] Viral pneumonia [J12.9] Suspected COVID-19 virus infection [Z20.828] Pneumonia due to COVID-19 virus [U07.1, J12.89] Patient Active Problem List   Diagnosis Date Noted  . Pneumonia due to COVID-19 virus 11/11/2019  . AF (paroxysmal atrial fibrillation) ([place]) 11/11/2019  . Diabetes mellitus type 2, uncontrolled, with complications  ([place]) 18/84/1660  . Diabetic retinopathy ([place]) 11/11/2019  . HLD (hyperlipidemia) 11/11/2019  . Obesity, diabetes, and hypertension syndrome ([place]) 11/11/2019  . Acute respiratory failure with hypoxia ([place]) 11/10/2019  . Chronic anticoagulation 07/25/2014  . Pre-syncope 07/25/2014  . Paroxysmal atrial fibrillation ([place]) 05/30/2014  . Essential hypertension 05/30/2014  . Hyperlipidemia 05/30/2014  . OSA (obstructive sleep apnea) 05/30/2014   PCP:  Jefm Petty, MD Pharmacy:   [id number] - [place], [place] - [address] AT [place] [address] [place] [address] Phone: [phone] Fax: [phone]     Social Determinants of Health (SDOH) Interventions    Readmission Risk Interventions No flowsheet data found.

## 2019-11-15 NOTE — Progress Notes (Signed)
Patient discharged. Discharge instructions reviewed with patient. All questions answered. IV removed. All belongings gathered and sent with patient.[place] dept HHS form reviewed and signed with patient. Patient transported via wheelchair to main lobby.

## 2019-11-15 NOTE — Plan of Care (Signed)
  Problem: Education: Goal: Knowledge of risk factors and measures for prevention of condition will improve Outcome: Progressing   Problem: Respiratory: Goal: Will maintain a patent airway Outcome: Progressing   Problem: Clinical Measurements: Goal: Will remain free from infection Outcome: Progressing

## 2019-11-20 NOTE — Progress Notes (Signed)
ILR remote 

## 2019-11-28 ENCOUNTER — Ambulatory Visit (INDEPENDENT_AMBULATORY_CARE_PROVIDER_SITE_OTHER): Payer: 59 | Admitting: *Deleted

## 2019-11-28 DIAGNOSIS — I480 Paroxysmal atrial fibrillation: Secondary | ICD-10-CM | POA: Diagnosis not present

## 2019-11-28 LAB — CUP PACEART REMOTE DEVICE CHECK
Date Time Interrogation Session: 20201230230720
Implantable Pulse Generator Implant Date: 20171013

## 2019-12-02 ENCOUNTER — Telehealth: Payer: Self-pay

## 2019-12-02 NOTE — Telephone Encounter (Signed)
LMOVM for pt to stop sending manual transmissions. 

## 2019-12-05 ENCOUNTER — Telehealth (HOSPITAL_COMMUNITY): Payer: Self-pay

## 2019-12-05 NOTE — Telephone Encounter (Signed)
Patients mother in clinic this morning to see Dr Shirlee Latch, requesting eliquis 5mg  samples for patient.  Patient is at home recovering from recent hospitalization with covid.  Per Dr , ok to provide   Medication Samples have been provided to the patient.  Drug name: Eliqius       Strength: 5mg         Qty: 28  LOTShirlee Latch  Exp.Date: 8/22  Dosing instructions: 1 tab bid  The patients mother has been instructed regarding the correct time, dose, and frequency of taking this medication, including desired effects and most common side effects.   : [id number] Teja Judice 11:20 AM 12/05/2019

## 2019-12-30 ENCOUNTER — Ambulatory Visit (INDEPENDENT_AMBULATORY_CARE_PROVIDER_SITE_OTHER): Payer: 59 | Admitting: *Deleted

## 2019-12-30 DIAGNOSIS — I480 Paroxysmal atrial fibrillation: Secondary | ICD-10-CM

## 2019-12-30 LAB — CUP PACEART REMOTE DEVICE CHECK
Date Time Interrogation Session: 20210131235056
Implantable Pulse Generator Implant Date: 20171013

## 2019-12-30 NOTE — Progress Notes (Signed)
ILR Remote 

## 2020-01-06 ENCOUNTER — Other Ambulatory Visit (HOSPITAL_COMMUNITY): Payer: Self-pay

## 2020-01-06 MED ORDER — FLECAINIDE ACETATE 50 MG PO TABS: 50.0000 mg | ORAL_TABLET | Freq: Two times a day (BID) | ORAL | 3 refills | Status: DC

## 2020-01-30 ENCOUNTER — Ambulatory Visit (INDEPENDENT_AMBULATORY_CARE_PROVIDER_SITE_OTHER): Payer: 59 | Admitting: *Deleted

## 2020-01-30 DIAGNOSIS — I480 Paroxysmal atrial fibrillation: Secondary | ICD-10-CM

## 2020-01-30 LAB — CUP PACEART REMOTE DEVICE CHECK
Date Time Interrogation Session: 20210304021605
Implantable Pulse Generator Implant Date: 20171013

## 2020-01-30 NOTE — Progress Notes (Signed)
ILR Remote 

## 2020-02-02 ENCOUNTER — Other Ambulatory Visit: Payer: Self-pay | Admitting: Internal Medicine

## 2020-02-03 NOTE — Telephone Encounter (Signed)
Prescription refill request for Eliquis received.  Last office visit: 10/21/2019, [place] Scr:  0.72, 11/15/2019 Age: 64 y.o. Weight: 104.3 kg   Prescription refill sent.

## 2020-03-02 ENCOUNTER — Ambulatory Visit (INDEPENDENT_AMBULATORY_CARE_PROVIDER_SITE_OTHER): Payer: 59 | Admitting: *Deleted

## 2020-03-02 DIAGNOSIS — I480 Paroxysmal atrial fibrillation: Secondary | ICD-10-CM | POA: Diagnosis not present

## 2020-03-02 LAB — CUP PACEART REMOTE DEVICE CHECK
Date Time Interrogation Session: 20210404031800
Implantable Pulse Generator Implant Date: 20171013

## 2020-03-03 NOTE — Progress Notes (Signed)
ILR Remote 

## 2020-03-30 ENCOUNTER — Telehealth: Payer: Self-pay

## 2020-03-30 NOTE — Telephone Encounter (Signed)
Linq alert, device reached RRT 03/29/20.   Called patient to see if he wanted to have it explanted to leave in. Patient states he would like to leave it in. Advised patient if he would like it remove at any point he can always call the office. Questions answered.

## 2020-04-28 ENCOUNTER — Other Ambulatory Visit (HOSPITAL_COMMUNITY): Payer: Self-pay

## 2020-04-28 MED ORDER — METOPROLOL SUCCINATE ER 50 MG PO TB24: 50.0000 mg | ORAL_TABLET | Freq: Every day | ORAL | 3 refills | Status: DC

## 2020-05-06 ENCOUNTER — Other Ambulatory Visit (HOSPITAL_COMMUNITY): Payer: Self-pay

## 2020-05-06 MED ORDER — FLECAINIDE ACETATE 50 MG PO TABS: 50.0000 mg | ORAL_TABLET | Freq: Two times a day (BID) | ORAL | 3 refills | Status: DC

## 2020-05-06 NOTE — Telephone Encounter (Signed)
Meds ordered this encounter  Medications  . flecainide (TAMBOCOR) 50 MG tablet    Sig: Take 1 tablet (50 mg total) by mouth 2 (two) times daily.    Dispense:  180 tablet    Refill:  3

## 2020-08-07 ENCOUNTER — Ambulatory Visit (HOSPITAL_COMMUNITY)
Admission: RE | Admit: 2020-08-07 | Discharge: 2020-08-07 | Disposition: A | Discharge status: Home / Self Care | Payer: 59 | Source: Ambulatory Visit | Attending: Cardiology | Admitting: Cardiology

## 2020-08-07 ENCOUNTER — Other Ambulatory Visit: Payer: Self-pay

## 2020-08-07 ENCOUNTER — Encounter (HOSPITAL_COMMUNITY): Payer: Self-pay | Admitting: Cardiology

## 2020-08-07 VITALS — BP 138/86 | HR 70 | Wt 230.0 lb

## 2020-08-07 DIAGNOSIS — I10 Essential (primary) hypertension: Secondary | ICD-10-CM | POA: Insufficient documentation

## 2020-08-07 DIAGNOSIS — Z6831 Body mass index (BMI) 31.0-31.9, adult: Secondary | ICD-10-CM | POA: Diagnosis not present

## 2020-08-07 DIAGNOSIS — E785 Hyperlipidemia, unspecified: Secondary | ICD-10-CM | POA: Insufficient documentation

## 2020-08-07 DIAGNOSIS — Z7984 Long term (current) use of oral hypoglycemic drugs: Secondary | ICD-10-CM | POA: Diagnosis not present

## 2020-08-07 DIAGNOSIS — Z8249 Family history of ischemic heart disease and other diseases of the circulatory system: Secondary | ICD-10-CM | POA: Insufficient documentation

## 2020-08-07 DIAGNOSIS — Z8701 Personal history of pneumonia (recurrent): Secondary | ICD-10-CM | POA: Insufficient documentation

## 2020-08-07 DIAGNOSIS — I480 Paroxysmal atrial fibrillation: Secondary | ICD-10-CM | POA: Diagnosis not present

## 2020-08-07 DIAGNOSIS — Z880 Allergy status to penicillin: Secondary | ICD-10-CM | POA: Diagnosis not present

## 2020-08-07 DIAGNOSIS — Z9989 Dependence on other enabling machines and devices: Secondary | ICD-10-CM | POA: Insufficient documentation

## 2020-08-07 DIAGNOSIS — Z79899 Other long term (current) drug therapy: Secondary | ICD-10-CM | POA: Diagnosis not present

## 2020-08-07 DIAGNOSIS — Z7722 Contact with and (suspected) exposure to environmental tobacco smoke (acute) (chronic): Secondary | ICD-10-CM | POA: Diagnosis not present

## 2020-08-07 DIAGNOSIS — G4733 Obstructive sleep apnea (adult) (pediatric): Secondary | ICD-10-CM | POA: Insufficient documentation

## 2020-08-07 DIAGNOSIS — E119 Type 2 diabetes mellitus without complications: Secondary | ICD-10-CM | POA: Insufficient documentation

## 2020-08-07 DIAGNOSIS — Z7901 Long term (current) use of anticoagulants: Secondary | ICD-10-CM | POA: Insufficient documentation

## 2020-08-07 DIAGNOSIS — Z833 Family history of diabetes mellitus: Secondary | ICD-10-CM | POA: Insufficient documentation

## 2020-08-07 DIAGNOSIS — Z8616 Personal history of COVID-19: Secondary | ICD-10-CM | POA: Diagnosis not present

## 2020-08-07 DIAGNOSIS — E669 Obesity, unspecified: Secondary | ICD-10-CM | POA: Insufficient documentation

## 2020-08-07 LAB — CBC
HCT: 41.9 % (ref 39.0–52.0)
Hemoglobin: 13.8 g/dL (ref 13.0–17.0)
MCH: 30 pg (ref 26.0–34.0)
MCHC: 32.9 g/dL (ref 30.0–36.0)
MCV: 91.1 fL (ref 80.0–100.0)
Platelets: 306 10*3/uL (ref 150–400)
RBC: 4.6 MIL/uL (ref 4.22–5.81)
RDW: 13.2 % (ref 11.5–15.5)
WBC: 7.4 10*3/uL (ref 4.0–10.5)
nRBC: 0 % (ref 0.0–0.2)

## 2020-08-07 LAB — BASIC METABOLIC PANEL
Anion gap: 12 (ref 5–15)
BUN: 13 mg/dL (ref 8–23)
CO2: 24 mmol/L (ref 22–32)
Calcium: 9.8 mg/dL (ref 8.9–10.3)
Chloride: 104 mmol/L (ref 98–111)
Creatinine, Ser: 0.84 mg/dL (ref 0.61–1.24)
GFR calc Af Amer: >60 mL/min (ref >60)
GFR calc non Af Amer: >60 mL/min (ref >60)
Glucose, Bld: 79 mg/dL (ref 70–99)
Potassium: 3.8 mmol/L (ref 3.5–5.1)
Sodium: 140 mmol/L (ref 135–145)

## 2020-08-07 LAB — LIPID PANEL
Cholesterol: 111 mg/dL (ref 0–200)
HDL: 42 mg/dL (ref >40)
LDL Cholesterol: 53 mg/dL (ref 0–99)
Total CHOL/HDL Ratio: 2.6 RATIO
Triglycerides: 79 mg/dL (ref <150)
VLDL: 16 mg/dL (ref 0–40)

## 2020-08-07 NOTE — Patient Instructions (Signed)
It was great to see you today! No medication changes are needed at this time.  Labs today We will only contact you if something comes back abnormal or we need to make some changes. Otherwise no news is good news!  Your physician recommends that you schedule a follow-up appointment in: 6 months with Dr Shirlee Latch  If you have any questions or concerns before your next appointment please send [name] a message through [facility] or call our office at [phone].    TO LEAVE A MESSAGE FOR THE NURSE SELECT OPTION 2, PLEASE LEAVE A MESSAGE INCLUDING: . YOUR NAME . DATE OF BIRTH . CALL BACK NUMBER . REASON FOR CALL**this is important as we prioritize the call backs  YOU WILL RECEIVE A CALL BACK THE SAME DAY AS LONG AS YOU CALL BEFORE 4:00 PM

## 2020-08-07 NOTE — Progress Notes (Signed)
Medication Samples have been provided to the patient.  Drug name: eliquis       Strength: 5 mg        Qty: 28  LOT: [id number]  Exp.Date: 06/2022  Dosing instructions: ONE TAB TWICE DAILY   The patient has been instructed regarding the correct time, dose, and frequency of taking this medication, including desired effects and most common side effects.   Theresia Bough 12:52 PM 08/07/2020

## 2020-08-09 NOTE — Progress Notes (Signed)
ID:  VERNA Carr, DOB [birth year], MRN [id number]   Provider location: [facility] Type of Visit: Established patient   PCP:  Loyal Jacobson, MD  Cardiologist: Dr. Shirlee Latch   History of Present Illness: George Carr is a 64 y.o. male who has a history of type II diabetes, HTN, and atrial fibrillation.  Atrial fibrillation was first noted in 2/15 when he went for a colonoscopy.  He was unaware of it, no symptoms.  He was started on sotalol and Xarelto, later switched to Eliquis.  He went back into NSR.  He denies exertional dyspnea or chest pain.  He has good exercise tolerance.  He tries to walk for exercise as much as he can.  He has OSA and is using CPAP.  Echo was done in 3/15, showing preserved LV systolic function.  Lexiscan Cardiolite was done in 3/15 showing no ischemia.   Patient was doing well until 07/25/14.  On that day, he had a "weak spell" while walking through his kitchen.  He did not pass out completely but felt weak and lightheaded and slumped down to the floor.  He did not feel chest pain or palpitations.  He felt back to normal after about 30 seconds.  He has not had any further episodes like this.  He was seen in this office in on 8/28 after the "spell."  He was noted to be in atrial fibrillation with rate control.  He did not feel the atrial fibrillation.  Presyncopal episode was thought to be possible related to postural hypotension.  HCTZ was stopped and he has had no further dizziness.    Due to breakthrough atrial fibrillation, he was transitioned from sotalol to flecainide.    In 12/20, he had COVID-19 PNA, was hospitalized for about a week.   He returns for followup of atrial fibrillation.  No palpitations, he is in NSR today.  Weight is down 2 lbs. He uses CPAP regularly.  No significant exertional dyspnea.  No chest pain.  No orthopnea/PND.     Labs (10/19): K 3.9, creatinine 0.72, hgb 13.5 Labs (1/20): K 4.3, creatinine  0.81 Labs (5/20): LDL 58, HDL 51  ECG (personally reviewed): NSR, normal  PMH: 1. Atrial fibrillation: Paroxysmal.  - ETT 3/19 was normal.  - Echo (5/18): EF 60%, RV normal.  2. HTN 3. Type 2 diabetes 4. Hyperlipidemia 5. COVID-19 PNA in 12/20.  6. OSA: Uses CPAP.   Current Outpatient Medications  Medication Sig Dispense Refill  . amLODipine (NORVASC) 10 MG tablet TAKE 1 TABLET(10 MG) BY MOUTH DAILY (Patient taking differently: Take 10 mg by mouth daily. ) 90 tablet 3  . atorvastatin (LIPITOR) 10 MG tablet TAKE 1/2 TABLET(5 MG) BY MOUTH DAILY (Patient taking differently: Take 5 mg by mouth daily at 6 PM. ) 45 tablet 3  . ELIQUIS 5 MG TABS tablet TAKE 1 TABLET(5 MG) BY MOUTH TWICE DAILY 60 tablet 5  . flecainide (TAMBOCOR) 50 MG tablet Take 1 tablet (50 mg total) by mouth 2 (two) times daily. 180 tablet 3  . lisinopril (ZESTRIL) 40 MG tablet TAKE 1 TABLET(40 MG) BY MOUTH DAILY 90 tablet 3  . loratadine (CLARITIN) 10 MG tablet Take 10 mg by mouth daily as needed for allergies.    . metFORMIN (GLUCOPHAGE-XR) 500 MG 24 hr tablet Take 1,000 mg by mouth 2 (two) times daily.     . metoprolol succinate (TOPROL-XL) 50 MG 24 hr tablet Take 1 tablet (  50 mg total) by mouth daily. Take with or immediately following a meal. 30 tablet 3  . ONE TOUCH ULTRA TEST test strip CHECK BLOOD SUGAR ONCE D  5  . Semaglutide,0.25 or 0.5MG /DOS, (OZEMPIC, 0.25 OR 0.5 MG/DOSE,) 2 MG/1.5ML SOPN Inject 1.8 mg into the skin every 14 (fourteen) days.     . sildenafil (REVATIO) 20 MG tablet Take 80 mg by mouth as needed (ED). Take as directed    . fluticasone (FLONASE) 50 MCG/ACT nasal spray Place 1 spray into the nose daily as needed.  (Patient not taking: Reported on 08/07/2020)     No current facility-administered medications for this encounter.    Allergies:   Penicillins   Social History:  The patient  reports that he is a non-smoker but has been exposed to tobacco smoke. He has never used smokeless tobacco. He  reports current alcohol use. He reports that he does not use drugs.   Family History:  The patient's family history includes Diabetes in his mother and another family member; Heart disease in his maternal aunt; Hypertension in his father; Other in his father; Stroke in his father.   ROS:  Please see the history of present illness.   All other systems are personally reviewed and negative.   Exam:  BP 138/86   Pulse 70   Wt 104.3 kg (230 lb)   SpO2 97%   BMI 31.19 kg/m  General: NAD Neck: No JVD, no thyromegaly or thyroid nodule.  Lungs: Clear to auscultation bilaterally with normal respiratory effort. CV: Nondisplaced PMI.  Heart regular S1/S2, no S3/S4, no murmur.  No peripheral edema.  No carotid bruit.  Normal pedal pulses.  Abdomen: Soft, nontender, no hepatosplenomegaly, no distention.  Skin: Intact without lesions or rashes.  Neurologic: Alert and oriented x 3.  Psych: Normal affect. Extremities: No clubbing or cyanosis.  HEENT: Normal.   Recent Labs: 11/11/2019: B Natriuretic Peptide 61.0 11/13/2019: Magnesium 1.7 11/15/2019: ALT 62 08/07/2020: BUN 13; Creatinine, Ser 0.84; Hemoglobin 13.8; Platelets 306; Potassium 3.8; Sodium 140  Personally reviewed   Wt Readings from Last 3 Encounters:  08/07/20 104.3 kg (230 lb)  11/10/19 104.3 kg (230 lb)  10/21/19 105.5 kg (232 lb 9.6 oz)     ASSESSMENT AND PLAN:  1. Paroxysmal atrial fibrillation:  Multiple atrial fibrillation risk factors including HTN, diabetes, and OSA.  No recent palpitations, he is in NSR today. Flecainide seems to be controlling atrial fibrillation better than sotalol.  ETT on flecainide in 3/19 looked ok.  - Continue flecainide.  - Continue Toprol XL 50 mg daily.  - Continue Eliquis, check CBC.  - If he develops breakthrough atrial fibrillation, ablation would be an option.    2. HTN: BP is controlled.  3. Hyperlipidemia: Check lipids today.    4. OSA: Continue to use CPAP.   5. Obesity: Continue to  work on weight loss.    Followup in 6 months.   Signed, Marca Ancona, MD  08/09/2020  [facility] [address] [facility] [place] [address] [phone] (office) [phone] (fax)

## 2020-08-16 ENCOUNTER — Other Ambulatory Visit (HOSPITAL_COMMUNITY): Payer: Self-pay | Admitting: Cardiology

## 2020-08-21 NOTE — Addendum Note (Signed)
Encounter addended by: Lielle Vandervort Q, CMA on: 08/21/2020 9:48 AM  Actions taken: Charge Capture section accepted

## 2020-09-02 IMAGING — DX DG CHEST 1V PORT
1 series · 1 of 1 positions shown · non-contrast
Comparison: Radiographs 11/10/2019.

CLINICAL DATA: Shortness of breath. 982SY-L3 infection.

EXAM:
PORTABLE CHEST 1 VIEW

[chest]
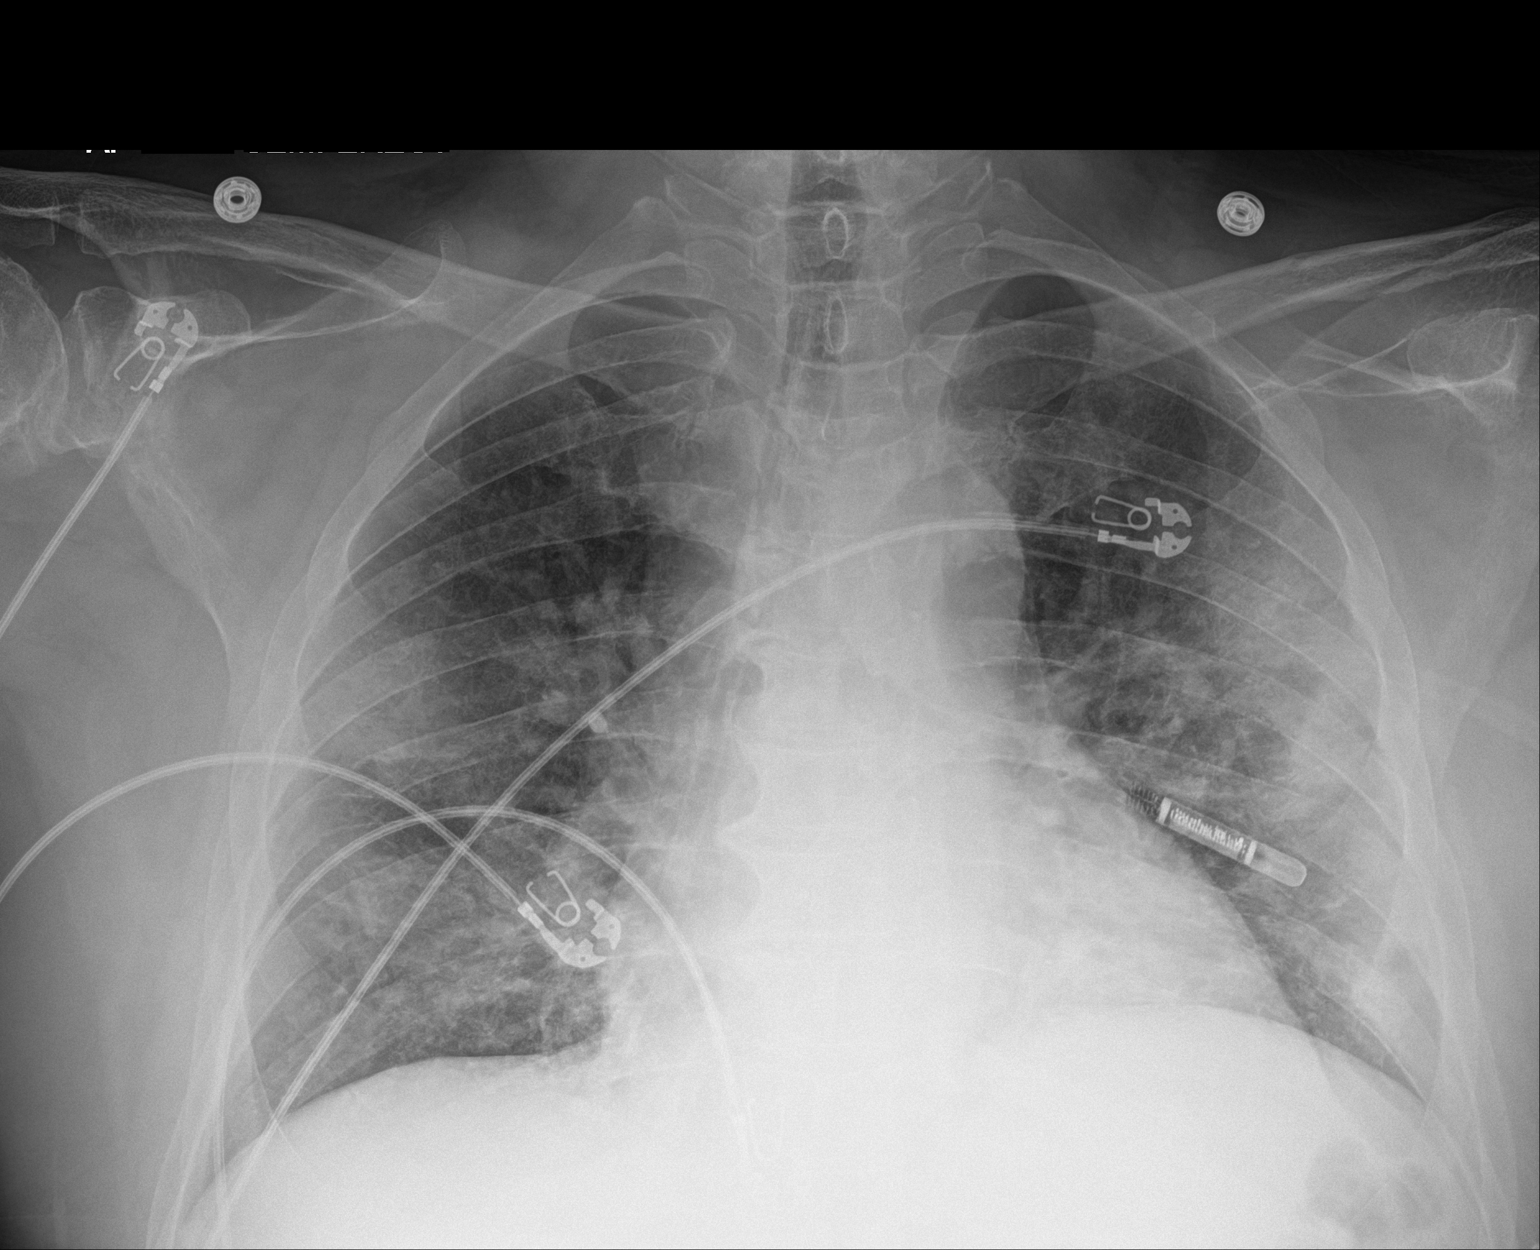

[1 of 1 positions shown; findings below may reference images not displayed]

FINDINGS: 9193 hours. The heart size and mediastinal contours are stable
without definite adenopathy. There are worsening peripheral
ground-glass opacities in both lungs, left greater than right. There
is no pneumothorax or significant pleural effusion. Probable loop
recorder overlies the lower left chest. No acute osseous findings.
IMPRESSION: Worsened peripheral ground-glass opacities in both lungs, left
greater than right, most compatible with worsening 982SY-L3
pneumonia.

## 2020-12-20 ENCOUNTER — Other Ambulatory Visit (HOSPITAL_COMMUNITY): Payer: Self-pay | Admitting: Cardiology

## 2021-04-12 ENCOUNTER — Other Ambulatory Visit (HOSPITAL_COMMUNITY): Payer: Self-pay

## 2021-04-12 DIAGNOSIS — I480 Paroxysmal atrial fibrillation: Secondary | ICD-10-CM

## 2021-04-12 MED ORDER — LISINOPRIL 40 MG PO TABS: ORAL_TABLET | ORAL | 4 refills | Status: DC

## 2021-05-02 ENCOUNTER — Other Ambulatory Visit (HOSPITAL_COMMUNITY): Payer: Self-pay | Admitting: Cardiology

## 2021-08-11 ENCOUNTER — Ambulatory Visit (HOSPITAL_COMMUNITY)
Admission: RE | Admit: 2021-08-11 | Discharge: 2021-08-11 | Disposition: A | Discharge status: Home / Self Care | Payer: 59 | Source: Ambulatory Visit | Attending: Cardiology | Admitting: Cardiology

## 2021-08-11 ENCOUNTER — Other Ambulatory Visit: Payer: Self-pay

## 2021-08-11 ENCOUNTER — Encounter (HOSPITAL_COMMUNITY): Payer: Self-pay | Admitting: Cardiology

## 2021-08-11 VITALS — BP 150/80 | HR 60 | Wt 227.4 lb

## 2021-08-11 DIAGNOSIS — Z09 Encounter for follow-up examination after completed treatment for conditions other than malignant neoplasm: Secondary | ICD-10-CM | POA: Insufficient documentation

## 2021-08-11 DIAGNOSIS — Z7901 Long term (current) use of anticoagulants: Secondary | ICD-10-CM | POA: Insufficient documentation

## 2021-08-11 DIAGNOSIS — Z6830 Body mass index (BMI) 30.0-30.9, adult: Secondary | ICD-10-CM | POA: Insufficient documentation

## 2021-08-11 DIAGNOSIS — Z7722 Contact with and (suspected) exposure to environmental tobacco smoke (acute) (chronic): Secondary | ICD-10-CM | POA: Diagnosis not present

## 2021-08-11 DIAGNOSIS — Z79899 Other long term (current) drug therapy: Secondary | ICD-10-CM | POA: Diagnosis not present

## 2021-08-11 DIAGNOSIS — E119 Type 2 diabetes mellitus without complications: Secondary | ICD-10-CM | POA: Diagnosis not present

## 2021-08-11 DIAGNOSIS — G4733 Obstructive sleep apnea (adult) (pediatric): Secondary | ICD-10-CM | POA: Insufficient documentation

## 2021-08-11 DIAGNOSIS — Z7984 Long term (current) use of oral hypoglycemic drugs: Secondary | ICD-10-CM | POA: Diagnosis not present

## 2021-08-11 DIAGNOSIS — Z8249 Family history of ischemic heart disease and other diseases of the circulatory system: Secondary | ICD-10-CM | POA: Diagnosis not present

## 2021-08-11 DIAGNOSIS — E785 Hyperlipidemia, unspecified: Secondary | ICD-10-CM | POA: Diagnosis not present

## 2021-08-11 DIAGNOSIS — E669 Obesity, unspecified: Secondary | ICD-10-CM | POA: Diagnosis not present

## 2021-08-11 DIAGNOSIS — I480 Paroxysmal atrial fibrillation: Secondary | ICD-10-CM

## 2021-08-11 DIAGNOSIS — I10 Essential (primary) hypertension: Secondary | ICD-10-CM | POA: Diagnosis not present

## 2021-08-11 DIAGNOSIS — Z8616 Personal history of COVID-19: Secondary | ICD-10-CM | POA: Diagnosis not present

## 2021-08-11 NOTE — Patient Instructions (Signed)
Check your blood pressure for 2 weeks. After 2 weeks, please send a My Chart message of the readings.    Your physician recommends that you schedule a follow-up appointment in:  6 months.  Our office will call you in closer to that time to schedule your appointment.  Please call office at [phone] option 2 if you have any questions or concerns.   At the [facility], you and your health needs are our priority. As part of our continuing mission to provide you with exceptional heart care, we have created designated Provider Care Teams. These Care Teams include your primary Cardiologist (physician) and Advanced Practice Providers (APPs- Physician Assistants and Nurse Practitioners) who all work together to provide you with the care you need, when you need it.   You may see any of the following providers on your designated Care Team at your next follow up: Dr Arvilla Meres Dr Marca Ancona Dr Brandon Melnick, NP Robbie Lis, [place] Mikki Santee Karle Plumber, PharmD   Please be sure to bring in all your medications bottles to every appointment.

## 2021-08-11 NOTE — Progress Notes (Signed)
ID:  George Carr, DOB 13-Jul-1956, MRN [id number]   Provider location: [facility] Type of Visit: Established patient   PCP:  Loyal Jacobson, MD  Cardiologist: Dr. Shirlee Latch   History of Present Illness: George Carr is a 65 y.o. male who has a history of type II diabetes, HTN, and atrial fibrillation.  Atrial fibrillation was first noted in 2/15 when he went for a colonoscopy.  He was unaware of it, no symptoms.  He was started on sotalol and Xarelto, later switched to Eliquis.  He went back into NSR.  He denies exertional dyspnea or chest pain.  He has good exercise tolerance.  He tries to walk for exercise as much as he can.  He has OSA and is using CPAP.  Echo was done in 3/15, showing preserved LV systolic function.  Lexiscan Cardiolite was done in 3/15 showing no ischemia.    Patient was doing well until 07/25/14.  On that day, he had a "weak spell" while walking through his kitchen.  He did not pass out completely but felt weak and lightheaded and slumped down to the floor.  He did not feel chest pain or palpitations.  He felt back to normal after about 30 seconds.  He has not had any further episodes like this.  He was seen in this office in on 8/28 after the "spell."  He was noted to be in atrial fibrillation with rate control.  He did not feel the atrial fibrillation.  Presyncopal episode was thought to be possible related to postural hypotension.  HCTZ was stopped and he has had no further dizziness.     Due to breakthrough atrial fibrillation, he was transitioned from sotalol to flecainide.    In 12/20, he had COVID-19 PNA, was hospitalized for about a week.   He returns for followup of atrial fibrillation.  No palpitations, he is in NSR today.  No BRBPR/melena, continues Eliquis.  BP elevated but says he was moving furniture at his office this morning before arrival.  Generally, SBP 120s when he checks at home.  Working full time. 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  No exertional dyspnea or chest pain.  Weight down 3 lbs.   Labs (10/19): K 3.9, creatinine 0.72, hgb 13.5 Labs (1/20): K 4.3, creatinine 0.81 Labs (5/20): LDL 58, HDL 51 Labs (8/22): LDL 58, hgb 13.6, K 4.1, creatinine 0.86  ECG (personally reviewed): NSR, LAFB, IVCD 120 msec  PMH: 1. Atrial fibrillation: Paroxysmal.  - ETT 3/19 was normal.  - Echo (5/18): EF 60%, RV normal.  2. HTN 3. Type 2 diabetes 4. Hyperlipidemia 5. COVID-19 PNA in 12/20.  6. OSA: Uses CPAP.   Current Outpatient Medications  Medication Sig Dispense Refill   amLODipine (NORVASC) 10 MG tablet TAKE 1 TABLET(10 MG) BY MOUTH DAILY 90 tablet 3   atorvastatin (LIPITOR) 10 MG tablet TAKE 1/2 TABLET(5 MG) BY MOUTH DAILY 45 tablet 3   ELIQUIS 5 MG TABS tablet TAKE 1 TABLET(5 MG) BY MOUTH TWICE DAILY 60 tablet 5   flecainide (TAMBOCOR) 50 MG tablet TAKE 1 TABLET(50 MG) BY MOUTH TWICE DAILY 180 tablet 3   fluticasone (FLONASE) 50 MCG/ACT nasal spray Place 1 spray into the nose daily as needed.     lisinopril (ZESTRIL) 40 MG tablet TAKE 1 TABLET(40 MG) BY MOUTH DAILY 60 tablet 4   loratadine (CLARITIN) 10 MG tablet Take 10 mg by mouth  daily as needed for allergies.     metFORMIN (GLUCOPHAGE) 500 MG tablet Take 500 mg by mouth 2 (two) times daily with a meal.     metoprolol succinate (TOPROL-XL) 50 MG 24 hr tablet TAKE 1 TABLET(50 MG) BY MOUTH DAILY WITH OR IMMEDIATELY FOLLOWING A MEAL 30 tablet 3   ONE TOUCH ULTRA TEST test strip CHECK BLOOD SUGAR ONCE D  5   sildenafil (REVATIO) 20 MG tablet Take 80 mg by mouth as needed (ED). Take as directed     No current facility-administered medications for this encounter.    Allergies:   Penicillins   Social History:  The patient  reports that he has never smoked. He has been exposed to tobacco smoke. He has never used smokeless tobacco. He reports current alcohol use. He reports that he does not use drugs.   Family History:  The  patient's family history includes Diabetes in his mother and another family member; Heart disease in his maternal aunt; Hypertension in his father; Other in his father; Stroke in his father.   ROS:  Please see the history of present illness.   All other systems are personally reviewed and negative.   Exam:  BP (!) 150/80   Pulse 60   Wt 103.1 kg (227 lb 6.4 oz)   SpO2 97%   BMI 30.84 kg/m  General: NAD Neck: No JVD, no thyromegaly or thyroid nodule.  Lungs: Clear to auscultation bilaterally with normal respiratory effort. CV: Nondisplaced PMI.  Heart regular S1/S2, no S3/S4, no murmur.  No peripheral edema.  No carotid bruit.  Normal pedal pulses.  Abdomen: Soft, nontender, no hepatosplenomegaly, no distention.  Skin: Intact without lesions or rashes.  Neurologic: Alert and oriented x 3.  Psych: Normal affect. Extremities: No clubbing or cyanosis.  HEENT: Normal.    Recent Labs: No results found for requested labs within last 8760 hours.  Personally reviewed   Wt Readings from Last 3 Encounters:  08/11/21 103.1 kg (227 lb 6.4 oz)  08/07/20 104.3 kg (230 lb)  11/10/19 104.3 kg (230 lb)     ASSESSMENT AND PLAN:  1. Paroxysmal atrial fibrillation:  Multiple atrial fibrillation risk factors including HTN, diabetes, and OSA.  No recent palpitations, he is in NSR today. Flecainide seems to be controlling atrial fibrillation better than sotalol.  ETT on flecainide in 3/19 looked ok.  - Continue flecainide.  - Continue Toprol XL 50 mg daily.  - Continue Eliquis, recent CBC was ok.  - If he develops breakthrough atrial fibrillation, ablation would be an option.    2. HTN: BP elevated today.   - I will have him check BP daily at home x 2 wks.  He will call in his numbers.  If BP elevated, will start chlorthalidone 12.5 daily.   - Continue amlodipine and lisinopril.  3. Hyperlipidemia: Good lipids in 8/22.     4. OSA: Continue to use CPAP.   5. Obesity: Continue to work on weight  loss.    Followup in 6 months.   Signed, Marca Ancona, MD  08/11/2021  [facility] [address] [facility] [place] [address] [phone] (office) [phone] (fax)

## 2021-08-15 ENCOUNTER — Other Ambulatory Visit (HOSPITAL_COMMUNITY): Payer: Self-pay | Admitting: Cardiology

## 2021-08-16 ENCOUNTER — Other Ambulatory Visit (HOSPITAL_COMMUNITY): Payer: Self-pay | Admitting: *Deleted

## 2021-08-16 DIAGNOSIS — I480 Paroxysmal atrial fibrillation: Secondary | ICD-10-CM

## 2021-08-16 MED ORDER — METOPROLOL SUCCINATE ER 50 MG PO TB24: ORAL_TABLET | ORAL | 3 refills | Status: DC

## 2021-08-16 MED ORDER — AMLODIPINE BESYLATE 10 MG PO TABS: ORAL_TABLET | ORAL | 3 refills | Status: DC

## 2021-08-16 MED ORDER — ATORVASTATIN CALCIUM 10 MG PO TABS: ORAL_TABLET | ORAL | 3 refills | Status: DC

## 2021-08-22 ENCOUNTER — Other Ambulatory Visit: Payer: Self-pay | Admitting: Internal Medicine

## 2021-08-23 NOTE — Telephone Encounter (Signed)
Eliquis 5 mg refill request received. Patient is 65 years old, weight-103.1 kg, Crea- 0.86  on 07/07/21, Diagnosis-PAF, and last seen by Dr. Shirlee Latch on 9*14/22. Dose is appropriate based on dosing criteria. Will send in refill to requested pharmacy.

## 2022-01-06 DIAGNOSIS — E538 Deficiency of other specified B group vitamins: Secondary | ICD-10-CM | POA: Diagnosis not present

## 2022-01-06 DIAGNOSIS — D509 Iron deficiency anemia, unspecified: Secondary | ICD-10-CM | POA: Diagnosis not present

## 2022-01-06 DIAGNOSIS — E113291 Type 2 diabetes mellitus with mild nonproliferative diabetic retinopathy without macular edema, right eye: Secondary | ICD-10-CM | POA: Diagnosis not present

## 2022-01-10 DIAGNOSIS — E538 Deficiency of other specified B group vitamins: Secondary | ICD-10-CM | POA: Diagnosis not present

## 2022-01-10 DIAGNOSIS — D509 Iron deficiency anemia, unspecified: Secondary | ICD-10-CM | POA: Diagnosis not present

## 2022-01-10 DIAGNOSIS — E113291 Type 2 diabetes mellitus with mild nonproliferative diabetic retinopathy without macular edema, right eye: Secondary | ICD-10-CM | POA: Diagnosis not present

## 2022-01-20 ENCOUNTER — Telehealth: Payer: Self-pay | Admitting: *Deleted

## 2022-01-20 DIAGNOSIS — Z86010 Personal history of colonic polyps: Secondary | ICD-10-CM | POA: Diagnosis not present

## 2022-01-20 DIAGNOSIS — D509 Iron deficiency anemia, unspecified: Secondary | ICD-10-CM | POA: Diagnosis not present

## 2022-01-20 NOTE — Telephone Encounter (Signed)
Per Dr. Shirlee Latch he has cleared the pt and is ok with hold time as requested for Eliquis x 2 days prior. Pt will ned to resume Eliquis once Dr. Loman Chroman feels it is safe. I will fax clearance to requesting office.

## 2022-01-20 NOTE — Telephone Encounter (Signed)
PT IS NOT FOLLOWED BY [facility]. PT'S PRIMARY CARDIOLOGIST IS DR. DALTON McLEAN WITH [facility]. I WILL PUT THIS CLEARANCE IN FOR DR. McLEAN AND FORWARD TO HIM, IF YOU HAVE ANY QUESTIONS PLEASE FOLLOW UP WITH DR. Shirlee Latch.     Pre-operative Risk Assessment    Patient Name: George Carr  DOB: 1956-11-10 MRN: [id number]      Request for Surgical Clearance    Procedure:   COLONOSCOPY  Date of Surgery:  Clearance TBD                                 Surgeon:  DR. Loman Chroman Surgeon's Group or Practice Name:  [facility] Phone number:  [phone] Fax number:  [phone]   Type of Clearance Requested:   - Medical  - Pharmacy:  Hold Apixaban (Eliquis) x 2 DAYS PRIOR   Type of Anesthesia:  Not Indicated (PROPOFOL?)   Additional requests/questions:    Elpidio Anis   01/20/2022, 9:32 AM

## 2022-02-11 ENCOUNTER — Telehealth (HOSPITAL_COMMUNITY): Payer: Self-pay

## 2022-02-11 NOTE — Telephone Encounter (Signed)
Cardiac clearance form faxed to [facility] on 02/11/2022 @ 11.20am ?

## 2022-03-16 DIAGNOSIS — H35371 Puckering of macula, right eye: Secondary | ICD-10-CM | POA: Diagnosis not present

## 2022-03-16 DIAGNOSIS — H2513 Age-related nuclear cataract, bilateral: Secondary | ICD-10-CM | POA: Diagnosis not present

## 2022-03-16 DIAGNOSIS — H35342 Macular cyst, hole, or pseudohole, left eye: Secondary | ICD-10-CM | POA: Diagnosis not present

## 2022-03-16 DIAGNOSIS — E113291 Type 2 diabetes mellitus with mild nonproliferative diabetic retinopathy without macular edema, right eye: Secondary | ICD-10-CM | POA: Diagnosis not present

## 2022-03-18 DIAGNOSIS — K317 Polyp of stomach and duodenum: Secondary | ICD-10-CM | POA: Diagnosis not present

## 2022-03-18 DIAGNOSIS — K640 First degree hemorrhoids: Secondary | ICD-10-CM | POA: Diagnosis not present

## 2022-03-18 DIAGNOSIS — D509 Iron deficiency anemia, unspecified: Secondary | ICD-10-CM | POA: Diagnosis not present

## 2022-03-18 DIAGNOSIS — K5730 Diverticulosis of large intestine without perforation or abscess without bleeding: Secondary | ICD-10-CM | POA: Diagnosis not present

## 2022-03-18 DIAGNOSIS — Z86010 Personal history of colonic polyps: Secondary | ICD-10-CM | POA: Diagnosis not present

## 2022-03-18 DIAGNOSIS — K31A19 Gastric intestinal metaplasia without dysplasia, unspecified site: Secondary | ICD-10-CM | POA: Diagnosis not present

## 2022-03-18 DIAGNOSIS — Z1211 Encounter for screening for malignant neoplasm of colon: Secondary | ICD-10-CM | POA: Diagnosis not present

## 2022-03-18 DIAGNOSIS — K2950 Unspecified chronic gastritis without bleeding: Secondary | ICD-10-CM | POA: Diagnosis not present

## 2022-03-18 DIAGNOSIS — K449 Diaphragmatic hernia without obstruction or gangrene: Secondary | ICD-10-CM | POA: Diagnosis not present

## 2022-03-18 DIAGNOSIS — K2270 Barrett's esophagus without dysplasia: Secondary | ICD-10-CM | POA: Diagnosis not present

## 2022-03-29 ENCOUNTER — Encounter (HOSPITAL_COMMUNITY): Payer: Self-pay | Admitting: Cardiology

## 2022-03-29 ENCOUNTER — Ambulatory Visit (HOSPITAL_COMMUNITY)
Admission: RE | Admit: 2022-03-29 | Discharge: 2022-03-29 | Disposition: A | Discharge status: Home / Self Care | Payer: Medicare HMO | Source: Ambulatory Visit | Attending: Cardiology | Admitting: Cardiology

## 2022-03-29 ENCOUNTER — Other Ambulatory Visit (HOSPITAL_COMMUNITY): Payer: Self-pay | Admitting: Cardiology

## 2022-03-29 VITALS — BP 138/72 | HR 50 | Wt 238.2 lb

## 2022-03-29 DIAGNOSIS — E785 Hyperlipidemia, unspecified: Secondary | ICD-10-CM | POA: Diagnosis not present

## 2022-03-29 DIAGNOSIS — I10 Essential (primary) hypertension: Secondary | ICD-10-CM | POA: Insufficient documentation

## 2022-03-29 DIAGNOSIS — Z7901 Long term (current) use of anticoagulants: Secondary | ICD-10-CM | POA: Insufficient documentation

## 2022-03-29 DIAGNOSIS — I493 Ventricular premature depolarization: Secondary | ICD-10-CM

## 2022-03-29 DIAGNOSIS — G4733 Obstructive sleep apnea (adult) (pediatric): Secondary | ICD-10-CM | POA: Diagnosis not present

## 2022-03-29 DIAGNOSIS — Z8616 Personal history of COVID-19: Secondary | ICD-10-CM | POA: Diagnosis not present

## 2022-03-29 DIAGNOSIS — Z6832 Body mass index (BMI) 32.0-32.9, adult: Secondary | ICD-10-CM | POA: Insufficient documentation

## 2022-03-29 DIAGNOSIS — E119 Type 2 diabetes mellitus without complications: Secondary | ICD-10-CM | POA: Diagnosis not present

## 2022-03-29 DIAGNOSIS — I480 Paroxysmal atrial fibrillation: Secondary | ICD-10-CM | POA: Insufficient documentation

## 2022-03-29 DIAGNOSIS — Z9989 Dependence on other enabling machines and devices: Secondary | ICD-10-CM | POA: Diagnosis not present

## 2022-03-29 DIAGNOSIS — Z79899 Other long term (current) drug therapy: Secondary | ICD-10-CM | POA: Insufficient documentation

## 2022-03-29 DIAGNOSIS — E669 Obesity, unspecified: Secondary | ICD-10-CM | POA: Diagnosis not present

## 2022-03-29 LAB — CBC
HCT: 41.5 % (ref 39.0–52.0)
Hemoglobin: 14.3 g/dL (ref 13.0–17.0)
MCH: 31.3 pg (ref 26.0–34.0)
MCHC: 34.5 g/dL (ref 30.0–36.0)
MCV: 90.8 fL (ref 80.0–100.0)
Platelets: 280 10*3/uL (ref 150–400)
RBC: 4.57 MIL/uL (ref 4.22–5.81)
RDW: 12.9 % (ref 11.5–15.5)
WBC: 6.7 10*3/uL (ref 4.0–10.5)
nRBC: 0 % (ref 0.0–0.2)

## 2022-03-29 LAB — LIPID PANEL
Cholesterol: 134 mg/dL (ref 0–200)
HDL: 42 mg/dL (ref >40)
LDL Cholesterol: 54 mg/dL (ref 0–99)
Total CHOL/HDL Ratio: 3.2 RATIO
Triglycerides: 188 mg/dL — ABNORMAL HIGH (ref <150)
VLDL: 38 mg/dL (ref 0–40)

## 2022-03-29 LAB — BASIC METABOLIC PANEL
Anion gap: 7 (ref 5–15)
BUN: 13 mg/dL (ref 8–23)
CO2: 30 mmol/L (ref 22–32)
Calcium: 9.8 mg/dL (ref 8.9–10.3)
Chloride: 106 mmol/L (ref 98–111)
Creatinine, Ser: 0.93 mg/dL (ref 0.61–1.24)
GFR, Estimated: >60 mL/min (ref >60)
Glucose, Bld: 192 mg/dL — ABNORMAL HIGH (ref 70–99)
Potassium: 4.2 mmol/L (ref 3.5–5.1)
Sodium: 143 mmol/L (ref 135–145)

## 2022-03-29 NOTE — Progress Notes (Signed)
Zio patch placed onto patient.  All instructions and information reviewed with patient, they verbalize understanding with no questions. 

## 2022-03-29 NOTE — Patient Instructions (Signed)
There has been no changes to your medications. ? ?Labs done today, your results will be available in MyChart, we will contact you for abnormal readings. ? ?Your provider has recommended that  you wear a Zio Patch for 7 days.  This monitor will record your heart rhythm for our review.  IF you have any symptoms while wearing the monitor please press the button.  If you have any issues with the patch or you notice a red or orange light on it please call the company at [phone].  Once you remove the patch please mail it back to the company as soon as possible so we can get the results. ? ?Your physician recommends that you schedule a follow-up appointment in: 6 months (November 2023)  ** please call the office in September to arrange your follow up appointment** ? ?If you have any questions or concerns before your next appointment please send [name] a message through [place] or call our office at [phone].   ? ?TO LEAVE A MESSAGE FOR THE NURSE SELECT OPTION 2, PLEASE LEAVE A MESSAGE INCLUDING: ?YOUR NAME ?DATE OF BIRTH ?CALL BACK NUMBER ?REASON FOR CALL**this is important as we prioritize the call backs ? ?YOU WILL RECEIVE A CALL BACK THE SAME DAY AS LONG AS YOU CALL BEFORE 4:00 PM ? ?At the [facility], you and your health needs are our priority. As part of our continuing mission to provide you with exceptional heart care, we have created designated Provider Care Teams. These Care Teams include your primary Cardiologist (physician) and Advanced Practice Providers (APPs- Physician Assistants and Nurse Practitioners) who all work together to provide you with the care you need, when you need it.  ? ?You may see any of the following providers on your designated Care Team at your next follow up: ?Dr Arvilla Meres ?Dr Marca Ancona ?Tonye Becket, NP ?Robbie Lis, PA ?Jessica Milford,NP ?Anna Genre, PA ?Karle Plumber, PharmD ? ? ?Please be sure to bring in all your medications bottles to every  appointment.  ? ? ?

## 2022-03-29 NOTE — Progress Notes (Signed)
?  ? ? ?ID:  George Carr, DOB 06/19/1956, MRN [id number]   ?Provider location: [facility] ?Type of Visit: Established patient  ? ?PCP:  Jefm Petty, MD  ?Cardiologist: Dr. Aundra Dubin ?  ?History of Present Illness: ?George Carr is a 66 y.o. male who has a history of type II diabetes, HTN, and atrial fibrillation.  Atrial fibrillation was first noted in 2/15 when he went for a colonoscopy.  He was unaware of it, no symptoms.  He was started on sotalol and Xarelto, later switched to Eliquis.  He went back into NSR.  He denies exertional dyspnea or chest pain.  He has good exercise tolerance.  He tries to walk for exercise as much as he can.  He has OSA and is using CPAP.  Echo was done in 3/15, showing preserved LV systolic function.  Lexiscan Cardiolite was done in 3/15 showing no ischemia.  ?  ?Patient was doing well until 07/25/14.  On that day, he had a "weak spell" while walking through his kitchen.  He did not pass out completely but felt weak and lightheaded and slumped down to the floor.  He did not feel chest pain or palpitations.  He felt back to normal after about 30 seconds.  He has not had any further episodes like this.  He was seen in this office in on 8/28 after the "spell."  He was noted to be in atrial fibrillation with rate control.  He did not feel the atrial fibrillation.  Presyncopal episode was thought to be possible related to postural hypotension.  HCTZ was stopped and he has had no further dizziness.   ?  ?Due to breakthrough atrial fibrillation, he was transitioned from sotalol to flecainide.   ? ?In 12/20, he had COVID-19 PNA, was hospitalized for about a week.  ? ?He returns for followup of atrial fibrillation.  No palpitations, he is in NSR today.  No BRBPR/melena, continues Eliquis.  BP has been generally controlled on current regimen. No significant exertional dyspnea, no chest pain.  No orthopnea/PND.  Weight has trended up.  ? ?Labs (10/19): K 3.9,  creatinine 0.72, hgb 13.5 ?Labs (1/20): K 4.3, creatinine 0.81 ?Labs (5/20): LDL 58, HDL 51 ?Labs (8/22): LDL 58, hgb 13.6, K 4.1, creatinine 0.86 ?Labs (2/23): K 4.2, creatinine 0.92 ? ?ECG (personally reviewed): NSR, LAFB, RBBB, PVCs ? ?PMH: ?1. Atrial fibrillation: Paroxysmal.  ?- ETT 3/19 was normal.  ?- Echo (5/18): EF 60%, RV normal.  ?2. HTN ?3. Type 2 diabetes ?4. Hyperlipidemia ?5. COVID-19 PNA in 12/20.  ?6. OSA: Uses CPAP.  ?7. GERD with esophagitis.  ? ?Current Outpatient Medications  ?Medication Sig Dispense Refill  ? amLODipine (NORVASC) 10 MG tablet TAKE 1 TABLET(10 MG) BY MOUTH DAILY 90 tablet 3  ? atorvastatin (LIPITOR) 10 MG tablet TAKE 1/2 TABLET(5 MG) BY MOUTH DAILY 45 tablet 3  ? cyanocobalamin 1000 MCG tablet Take 1 tablet by mouth daily.    ? ELIQUIS 5 MG TABS tablet TAKE 1 TABLET(5 MG) BY MOUTH TWICE DAILY 60 tablet 5  ? ferrous sulfate 325 (65 FE) MG tablet Take 1 tablet by mouth daily.    ? flecainide (TAMBOCOR) 50 MG tablet TAKE 1 TABLET(50 MG) BY MOUTH TWICE DAILY 180 tablet 3  ? fluticasone (FLONASE) 50 MCG/ACT nasal spray Place 1 spray into the nose daily as needed.    ? lisinopril (ZESTRIL) 40 MG tablet TAKE 1 TABLET(40 MG) BY MOUTH DAILY 60 tablet 4  ?  loratadine (CLARITIN) 10 MG tablet Take 10 mg by mouth daily as needed for allergies.    ? metFORMIN (GLUCOPHAGE) 500 MG tablet Take 500 mg by mouth 2 (two) times daily with a meal.    ? metoprolol succinate (TOPROL-XL) 50 MG 24 hr tablet TAKE 1 TABLET(50 MG) BY MOUTH DAILY WITH OR IMMEDIATELY FOLLOWING A MEAL 90 tablet 3  ? ONE TOUCH ULTRA TEST test strip CHECK BLOOD SUGAR ONCE D  5  ? pantoprazole (PROTONIX) 40 MG tablet Take 40 mg by mouth 2 (two) times daily.    ? sildenafil (REVATIO) 20 MG tablet Take 80 mg by mouth as needed (ED). Take as directed    ? ?No current facility-administered medications for this encounter.  ? ? ?Allergies:   Penicillins  ? ?Social History:  The patient  reports that he has never smoked. He has been  exposed to tobacco smoke. He has never used smokeless tobacco. He reports current alcohol use. He reports that he does not use drugs.  ? ?Family History:  The patient's family history includes Diabetes in his mother and another family member; Heart disease in his maternal aunt; Hypertension in his father; Other in his father; Stroke in his father.  ? ?ROS:  Please see the history of present illness.   All other systems are personally reviewed and negative.  ? ?Exam:  ?BP 138/72   Pulse (!) 50   Wt 108 kg (238 lb 3.2 oz)   SpO2 97%   BMI 32.31 kg/m?  ?General: NAD ?Neck: No JVD, no thyromegaly or thyroid nodule.  ?Lungs: Clear to auscultation bilaterally with normal respiratory effort. ?CV: Nondisplaced PMI.  Heart regular S1/S2, no S3/S4, no murmur.  No peripheral edema.  No carotid bruit.  Normal pedal pulses.  ?Abdomen: Soft, nontender, no hepatosplenomegaly, no distention.  ?Skin: Intact without lesions or rashes.  ?Neurologic: Alert and oriented x 3.  ?Psych: Normal affect. ?Extremities: No clubbing or cyanosis.  ?HEENT: Normal.  ? ?Recent Labs: ?03/29/2022: BUN 13; Creatinine, Ser 0.93; Hemoglobin 14.3; Platelets 280; Potassium 4.2; Sodium 143  ?Personally reviewed  ? ?Wt Readings from Last 3 Encounters:  ?03/29/22 108 kg (238 lb 3.2 oz)  ?08/11/21 103.1 kg (227 lb 6.4 oz)  ?08/07/20 104.3 kg (230 lb)  ?  ? ?ASSESSMENT AND PLAN: ? ?1. Paroxysmal atrial fibrillation:  Multiple atrial fibrillation risk factors including HTN, diabetes, and OSA.  No recent palpitations, he is in NSR today. Flecainide seems to be controlling atrial fibrillation better than sotalol.  ETT on flecainide in 3/19 looked ok.  ?- Continue flecainide.  ?- Continue Toprol XL 50 mg daily.  ?- Continue Eliquis, CBC today.   ?- If he develops breakthrough atrial fibrillation, ablation would be an option.    ?2. HTN: Generally controlled.  ?- Continue amlodipine and lisinopril.  ?3. Hyperlipidemia: Check lipids today.   ?4. OSA: Continue to  use CPAP.   ?5. Obesity: Continue to work on weight loss.   ?6. PVCs: PVCs noted on ECG today.  He does not feel PVCs but also does not feel atrial fibrillation.   ?- I will arrange for 7 day Zio monitor to quantify PVCs and to look out for more prolonged ventricular arrhythmias.  ? ?Followup in 6 months.  ? ?Signed, ?Loralie Champagne, MD  ?03/29/2022 ? ?[facility] ?[place] ?[address] ?[facility] ?[place] [address] ?[phone] (office) ?[phone]) ?

## 2022-04-01 ENCOUNTER — Encounter (HOSPITAL_COMMUNITY): Payer: 59 | Admitting: Cardiology

## 2022-04-12 DIAGNOSIS — I493 Ventricular premature depolarization: Secondary | ICD-10-CM | POA: Diagnosis not present

## 2022-04-18 ENCOUNTER — Telehealth (HOSPITAL_COMMUNITY): Payer: Self-pay | Admitting: Surgery

## 2022-04-18 NOTE — Telephone Encounter (Signed)
-----   Message from Laurey Morale, MD sent at 04/17/2022 10:02 PM EDT ----- No worrisome arrhythmias

## 2022-04-18 NOTE — Telephone Encounter (Signed)
I called patient to review results.  He has questions concerning his device and may like to have it explanted.  I let him know that I would forward his concern to the nurses at the that clinic and let them reach out to discuss his options.

## 2022-04-20 ENCOUNTER — Ambulatory Visit (INDEPENDENT_AMBULATORY_CARE_PROVIDER_SITE_OTHER): Payer: Medicare HMO | Admitting: Internal Medicine

## 2022-04-20 ENCOUNTER — Encounter: Payer: Self-pay | Admitting: Internal Medicine

## 2022-04-20 VITALS — BP 128/80 | HR 70 | Resp 96 | Ht 72.0 in | Wt 240.8 lb

## 2022-04-20 DIAGNOSIS — G4733 Obstructive sleep apnea (adult) (pediatric): Secondary | ICD-10-CM

## 2022-04-20 DIAGNOSIS — D6869 Other thrombophilia: Secondary | ICD-10-CM

## 2022-04-20 DIAGNOSIS — I480 Paroxysmal atrial fibrillation: Secondary | ICD-10-CM | POA: Diagnosis not present

## 2022-04-20 DIAGNOSIS — I10 Essential (primary) hypertension: Secondary | ICD-10-CM

## 2022-04-20 HISTORY — PX: OTHER SURGICAL HISTORY: SHX169

## 2022-04-20 NOTE — Patient Instructions (Signed)
Medication Instructions:  Your physician recommends that you continue on your current medications as directed. Please refer to the Current Medication list given to you today.  *If you need a refill on your cardiac medications before your next appointment, please call your pharmacy*   Lab Work: None ordered   Testing/Procedures: None ordered   Follow-Up: At [facility], you and your health needs are our priority.  As part of our continuing mission to provide you with exceptional heart care, we have created designated Provider Care Teams.  These Care Teams include your primary Cardiologist (physician) and Advanced Practice Providers (APPs -  Physician Assistants and Nurse Practitioners) who all work together to provide you with the care you need, when you need it.  Your next appointment:   as  scheduled  The format for your next appointment:   In Person  Provider:   Dr Aundra Dubin     Thank you for choosing [facility]!!   [phone]  Other Instructions   Important Information About Sugar        Implantable Loop Recorder Removal, Care After This sheet gives you information about how to care for yourself after your procedure. Your health care provider may also give you more specific instructions. If you have problems or questions, contact your health care provider. What can I expect after the procedure? After the procedure, it is common to have: Soreness or discomfort near the incision. Some swelling or bruising near the incision.  Follow these instructions at home: Incision care  Monitor your cardiac device site for redness, swelling, and drainage. Call the device clinic at [phone] if you experience these symptoms or fever/chills.  Keep the large square bandage on your site for 24 hours and then you may remove it yourself. Keep the steri-strips underneath in place.   You may shower after 24 hours  with the steri-strips in place. They will usually fall  off on their own, or may be removed after 10 days. Pat dry.   Avoid lotions, ointments, or perfumes over your incision until it is well-healed.  Please do not submerge in water until your site is completely healed.   If your wound site starts to bleed apply pressure.      If you have any questions/concerns please call the device clinic at [phone].  Activity  Return to your normal activities.  Contact a health care provider if: You have redness, swelling, or pain around your incision. You have a fever.

## 2022-04-20 NOTE — Progress Notes (Signed)
PCP: Loyal Jacobson, MD Primary Cardiologist: Dr Shirlee Latch Primary EP: Dr Cleotis Lema is a 66 y.o. male who presents today for routine electrophysiology followup.  Since last being seen in our clinic, the patient reports doing very well.  Today, he denies symptoms of palpitations, chest pain, shortness of breath,  lower extremity edema, dizziness, presyncope, or syncope.  The patient is otherwise without complaint today.   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)    Obesity, unspecified    Personal history of other specified diseases(V13.89)    Pure hyperglyceridemia    Type II or unspecified type diabetes mellitus without mention of complication, not stated as uncontrolled    Past Surgical History:  Procedure Laterality Date   cornea radial keratotomy Bilateral 2005   EP IMPLANTABLE DEVICE N/A 09/09/2016   Procedure: Loop Recorder Insertion;  Surgeon: Hillis Range, MD;  Location: [facility];  Service: Cardiovascular;  Laterality: N/A;   KNEE ARTHROTOMY Right 1978   NASAL SEPTOPLASTY W/ TURBINOPLASTY  1999    ROS- all systems are reviewed and negatives except as per HPI above  Current Outpatient Medications  Medication Sig Dispense Refill   amLODipine (NORVASC) 10 MG tablet TAKE 1 TABLET(10 MG) BY MOUTH DAILY 90 tablet 3   atorvastatin (LIPITOR) 10 MG tablet TAKE 1/2 TABLET(5 MG) BY MOUTH DAILY 45 tablet 3   cyanocobalamin 1000 MCG tablet Take 1 tablet by mouth daily.     ELIQUIS 5 MG TABS tablet TAKE 1 TABLET(5 MG) BY MOUTH TWICE DAILY 60 tablet 5   ferrous sulfate 325 (65 FE) MG tablet Take 1 tablet by mouth daily.     flecainide (TAMBOCOR) 50 MG tablet TAKE 1 TABLET(50 MG) BY MOUTH TWICE DAILY 180 tablet 3   fluticasone (FLONASE) 50 MCG/ACT nasal spray Place 1 spray into the nose daily as needed.     lisinopril (ZESTRIL) 40 MG tablet TAKE 1  TABLET(40 MG) BY MOUTH DAILY 60 tablet 4   loratadine (CLARITIN) 10 MG tablet Take 10 mg by mouth daily as needed for allergies.     metFORMIN (GLUCOPHAGE) 500 MG tablet Take 500 mg by mouth 2 (two) times daily with a meal.     metoprolol succinate (TOPROL-XL) 50 MG 24 hr tablet TAKE 1 TABLET(50 MG) BY MOUTH DAILY WITH OR IMMEDIATELY FOLLOWING A MEAL 90 tablet 3   ONE TOUCH ULTRA TEST test strip CHECK BLOOD SUGAR ONCE D  5   pantoprazole (PROTONIX) 40 MG tablet Take 40 mg by mouth 2 (two) times daily.     sildenafil (REVATIO) 20 MG tablet Take 80 mg by mouth as needed (ED). Take as directed     No current facility-administered medications for this visit.    Physical Exam: Vitals:   04/20/22 0827  BP: 128/80  Pulse: 70  Resp: (!) 96  Weight: 240 lb 12.8 oz (109.2 kg)  Height: 6' (1.829 m)    GEN- The patient is well appearing, alert and oriented x 3 today.   Head- normocephalic, atraumatic Eyes-  Sclera clear, conjunctiva pink Ears- hearing intact Oropharynx- clear Lungs- Clear to ausculation bilaterally, normal work of breathing Heart- Regular rate and rhythm, no murmurs, rubs or gallops, PMI not laterally displaced GI- soft, NT, ND, + BS Extremities- no clubbing, cyanosis, or edema  Wt Readings from Last 3 Encounters:  04/20/22 240 lb 12.8 oz (109.2 kg)  03/29/22 238 lb 3.2 oz (108 kg)  08/11/21 227 lb 6.4 oz (103.1 kg)      Assessment and Plan:  Paroxysmal atrial fibrillation Doing well currently with flecainide No eliquis for stroke prevention ILR is at RRT.  He wishes to have the device removed.  Risks and benefits to ILR removal were discussed at length with the patient who wishes to proceed.  2. HTN Stable No change required today  3. OSA Compliant with CPAP  4. PVC Event monitor reviewed Asymptomatic Rare No further workup planned  Follow-up with Dr Aundra Dubin as scheduled Will need close follow up on flecainide  Thompson Grayer MD, [facility] 04/20/2022 8:30  AM      PROCEDURES:   1. Implantable loop recorder explantation        DESCRIPTION OF PROCEDURE:  Informed written consent was obtained.  The patient required no sedation for the procedure today.   The patients left chest was therefore prepped and draped in the usual sterile fashion.  The skin overlying the ILR monitor was infiltrated with lidocaine for local analgesia.  A 0.5-cm incision was made over the site.  The previously implanted ILR was exposed and removed using a combination of sharp and blunt dissection.  Steri- Strips and a sterile dressing were then applied. EBL<67ml.  There were no early apparent complications.     CONCLUSIONS:   1. Successful explantation of a [employer] Reveal LINQ implantable loop recorder   2. No early apparent complications.        Thompson Grayer MD, [facility] 04/20/2022 8:52 AM

## 2022-05-01 ENCOUNTER — Other Ambulatory Visit (HOSPITAL_COMMUNITY): Payer: Self-pay | Admitting: Cardiology

## 2022-05-07 ENCOUNTER — Other Ambulatory Visit (HOSPITAL_COMMUNITY): Payer: Self-pay | Admitting: Cardiology

## 2022-05-07 DIAGNOSIS — I480 Paroxysmal atrial fibrillation: Secondary | ICD-10-CM

## 2022-06-02 DIAGNOSIS — K2270 Barrett's esophagus without dysplasia: Secondary | ICD-10-CM | POA: Diagnosis not present

## 2022-06-02 DIAGNOSIS — K208 Other esophagitis without bleeding: Secondary | ICD-10-CM | POA: Diagnosis not present

## 2022-06-02 DIAGNOSIS — K449 Diaphragmatic hernia without obstruction or gangrene: Secondary | ICD-10-CM | POA: Diagnosis not present

## 2022-06-02 DIAGNOSIS — K317 Polyp of stomach and duodenum: Secondary | ICD-10-CM | POA: Diagnosis not present

## 2022-06-02 DIAGNOSIS — D131 Benign neoplasm of stomach: Secondary | ICD-10-CM | POA: Diagnosis not present

## 2022-06-02 DIAGNOSIS — K319 Disease of stomach and duodenum, unspecified: Secondary | ICD-10-CM | POA: Diagnosis not present

## 2022-06-02 DIAGNOSIS — K209 Esophagitis, unspecified without bleeding: Secondary | ICD-10-CM | POA: Diagnosis not present

## 2022-07-12 DIAGNOSIS — Z125 Encounter for screening for malignant neoplasm of prostate: Secondary | ICD-10-CM | POA: Diagnosis not present

## 2022-07-12 DIAGNOSIS — Z0000 Encounter for general adult medical examination without abnormal findings: Secondary | ICD-10-CM | POA: Diagnosis not present

## 2022-07-12 DIAGNOSIS — Z130 Encounter for screening for diseases of the blood and blood-forming organs and certain disorders involving the immune mechanism: Secondary | ICD-10-CM | POA: Diagnosis not present

## 2022-07-12 DIAGNOSIS — Z13220 Encounter for screening for lipoid disorders: Secondary | ICD-10-CM | POA: Diagnosis not present

## 2022-07-12 DIAGNOSIS — R809 Proteinuria, unspecified: Secondary | ICD-10-CM | POA: Diagnosis not present

## 2022-07-12 DIAGNOSIS — Z1159 Encounter for screening for other viral diseases: Secondary | ICD-10-CM | POA: Diagnosis not present

## 2022-07-12 DIAGNOSIS — Z6832 Body mass index (BMI) 32.0-32.9, adult: Secondary | ICD-10-CM | POA: Diagnosis not present

## 2022-07-12 DIAGNOSIS — Z7984 Long term (current) use of oral hypoglycemic drugs: Secondary | ICD-10-CM | POA: Diagnosis not present

## 2022-07-12 DIAGNOSIS — E538 Deficiency of other specified B group vitamins: Secondary | ICD-10-CM | POA: Diagnosis not present

## 2022-07-12 DIAGNOSIS — Z1329 Encounter for screening for other suspected endocrine disorder: Secondary | ICD-10-CM | POA: Diagnosis not present

## 2022-07-12 DIAGNOSIS — I480 Paroxysmal atrial fibrillation: Secondary | ICD-10-CM | POA: Diagnosis not present

## 2022-07-12 DIAGNOSIS — N401 Enlarged prostate with lower urinary tract symptoms: Secondary | ICD-10-CM | POA: Diagnosis not present

## 2022-07-12 DIAGNOSIS — Z79899 Other long term (current) drug therapy: Secondary | ICD-10-CM | POA: Diagnosis not present

## 2022-07-12 DIAGNOSIS — E669 Obesity, unspecified: Secondary | ICD-10-CM | POA: Diagnosis not present

## 2022-07-12 DIAGNOSIS — D509 Iron deficiency anemia, unspecified: Secondary | ICD-10-CM | POA: Diagnosis not present

## 2022-07-12 DIAGNOSIS — E1129 Type 2 diabetes mellitus with other diabetic kidney complication: Secondary | ICD-10-CM | POA: Diagnosis not present

## 2022-07-12 DIAGNOSIS — E113291 Type 2 diabetes mellitus with mild nonproliferative diabetic retinopathy without macular edema, right eye: Secondary | ICD-10-CM | POA: Diagnosis not present

## 2022-07-12 DIAGNOSIS — R350 Frequency of micturition: Secondary | ICD-10-CM | POA: Diagnosis not present

## 2022-07-14 DIAGNOSIS — E1129 Type 2 diabetes mellitus with other diabetic kidney complication: Secondary | ICD-10-CM | POA: Diagnosis not present

## 2022-07-14 DIAGNOSIS — Z6832 Body mass index (BMI) 32.0-32.9, adult: Secondary | ICD-10-CM | POA: Diagnosis not present

## 2022-07-14 DIAGNOSIS — E113291 Type 2 diabetes mellitus with mild nonproliferative diabetic retinopathy without macular edema, right eye: Secondary | ICD-10-CM | POA: Diagnosis not present

## 2022-07-14 DIAGNOSIS — D509 Iron deficiency anemia, unspecified: Secondary | ICD-10-CM | POA: Diagnosis not present

## 2022-07-14 DIAGNOSIS — Z23 Encounter for immunization: Secondary | ICD-10-CM | POA: Diagnosis not present

## 2022-07-14 DIAGNOSIS — N401 Enlarged prostate with lower urinary tract symptoms: Secondary | ICD-10-CM | POA: Diagnosis not present

## 2022-07-14 DIAGNOSIS — R809 Proteinuria, unspecified: Secondary | ICD-10-CM | POA: Diagnosis not present

## 2022-07-14 DIAGNOSIS — R350 Frequency of micturition: Secondary | ICD-10-CM | POA: Diagnosis not present

## 2022-07-14 DIAGNOSIS — I480 Paroxysmal atrial fibrillation: Secondary | ICD-10-CM | POA: Diagnosis not present

## 2022-07-14 DIAGNOSIS — Z0000 Encounter for general adult medical examination without abnormal findings: Secondary | ICD-10-CM | POA: Diagnosis not present

## 2022-07-14 DIAGNOSIS — E538 Deficiency of other specified B group vitamins: Secondary | ICD-10-CM | POA: Diagnosis not present

## 2022-07-14 DIAGNOSIS — E669 Obesity, unspecified: Secondary | ICD-10-CM | POA: Diagnosis not present

## 2022-07-14 DIAGNOSIS — Z7984 Long term (current) use of oral hypoglycemic drugs: Secondary | ICD-10-CM | POA: Diagnosis not present

## 2022-07-14 DIAGNOSIS — Z1159 Encounter for screening for other viral diseases: Secondary | ICD-10-CM | POA: Diagnosis not present

## 2022-08-05 ENCOUNTER — Other Ambulatory Visit (HOSPITAL_COMMUNITY): Payer: Self-pay

## 2022-08-05 MED ORDER — ATORVASTATIN CALCIUM 10 MG PO TABS: ORAL_TABLET | ORAL | 3 refills | Status: DC

## 2022-08-11 ENCOUNTER — Other Ambulatory Visit (HOSPITAL_COMMUNITY): Payer: Self-pay

## 2022-08-11 MED ORDER — AMLODIPINE BESYLATE 10 MG PO TABS: ORAL_TABLET | ORAL | 3 refills | Status: DC

## 2022-08-15 DIAGNOSIS — L237 Allergic contact dermatitis due to plants, except food: Secondary | ICD-10-CM | POA: Diagnosis not present

## 2022-11-09 ENCOUNTER — Ambulatory Visit (HOSPITAL_COMMUNITY)
Admission: RE | Admit: 2022-11-09 | Discharge: 2022-11-09 | Disposition: A | Discharge status: Home / Self Care | Payer: Medicare HMO | Source: Ambulatory Visit | Attending: Cardiology | Admitting: Cardiology

## 2022-11-09 ENCOUNTER — Encounter (HOSPITAL_COMMUNITY): Payer: Self-pay | Admitting: Cardiology

## 2022-11-09 VITALS — BP 92/50 | HR 61 | Wt 245.0 lb

## 2022-11-09 DIAGNOSIS — E785 Hyperlipidemia, unspecified: Secondary | ICD-10-CM | POA: Diagnosis not present

## 2022-11-09 DIAGNOSIS — Z8616 Personal history of COVID-19: Secondary | ICD-10-CM | POA: Insufficient documentation

## 2022-11-09 DIAGNOSIS — E119 Type 2 diabetes mellitus without complications: Secondary | ICD-10-CM | POA: Insufficient documentation

## 2022-11-09 DIAGNOSIS — Z7901 Long term (current) use of anticoagulants: Secondary | ICD-10-CM | POA: Diagnosis not present

## 2022-11-09 DIAGNOSIS — G4733 Obstructive sleep apnea (adult) (pediatric): Secondary | ICD-10-CM | POA: Diagnosis not present

## 2022-11-09 DIAGNOSIS — Z7984 Long term (current) use of oral hypoglycemic drugs: Secondary | ICD-10-CM | POA: Insufficient documentation

## 2022-11-09 DIAGNOSIS — Z79899 Other long term (current) drug therapy: Secondary | ICD-10-CM | POA: Insufficient documentation

## 2022-11-09 DIAGNOSIS — E669 Obesity, unspecified: Secondary | ICD-10-CM | POA: Diagnosis not present

## 2022-11-09 DIAGNOSIS — I480 Paroxysmal atrial fibrillation: Secondary | ICD-10-CM | POA: Diagnosis not present

## 2022-11-09 DIAGNOSIS — Z6833 Body mass index (BMI) 33.0-33.9, adult: Secondary | ICD-10-CM | POA: Diagnosis not present

## 2022-11-09 DIAGNOSIS — I10 Essential (primary) hypertension: Secondary | ICD-10-CM | POA: Insufficient documentation

## 2022-11-09 LAB — CBC
HCT: 43.4 % (ref 39.0–52.0)
Hemoglobin: 14.5 g/dL (ref 13.0–17.0)
MCH: 31 pg (ref 26.0–34.0)
MCHC: 33.4 g/dL (ref 30.0–36.0)
MCV: 92.9 fL (ref 80.0–100.0)
Platelets: 269 10*3/uL (ref 150–400)
RBC: 4.67 MIL/uL (ref 4.22–5.81)
RDW: 13 % (ref 11.5–15.5)
WBC: 7.2 10*3/uL (ref 4.0–10.5)
nRBC: 0 % (ref 0.0–0.2)

## 2022-11-09 LAB — BASIC METABOLIC PANEL
Anion gap: 10 (ref 5–15)
BUN: 12 mg/dL (ref 8–23)
CO2: 26 mmol/L (ref 22–32)
Calcium: 9.6 mg/dL (ref 8.9–10.3)
Chloride: 103 mmol/L (ref 98–111)
Creatinine, Ser: 0.97 mg/dL (ref 0.61–1.24)
GFR, Estimated: >60 mL/min (ref >60)
Glucose, Bld: 124 mg/dL — ABNORMAL HIGH (ref 70–99)
Potassium: 4.3 mmol/L (ref 3.5–5.1)
Sodium: 139 mmol/L (ref 135–145)

## 2022-11-09 NOTE — Progress Notes (Signed)
ID:  JEREMY DITULLIO, DOB [birth year], MRN [id number]   Provider location: [facility] Type of Visit: Established patient   PCP:  Loyal Jacobson, MD  Cardiologist: Dr. Shirlee Latch   History of Present Illness: George Carr is a 66 y.o. male who has a history of type II diabetes, HTN, and atrial fibrillation.  Atrial fibrillation was first noted in 2/15 when he went for a colonoscopy.  He was unaware of it, no symptoms.  He was started on sotalol and Xarelto, later switched to Eliquis.  He went back into NSR.  He denies exertional dyspnea or chest pain.  He has good exercise tolerance.  He tries to walk for exercise as much as he can.  He has OSA and is using CPAP.  Echo was done in 3/15, showing preserved LV systolic function.  Lexiscan Cardiolite was done in 3/15 showing no ischemia.    Patient was doing well until 07/25/14.  On that day, he had a "weak spell" while walking through his kitchen.  He did not pass out completely but felt weak and lightheaded and slumped down to the floor.  He did not feel chest pain or palpitations.  He felt back to normal after about 30 seconds.  He has not had any further episodes like this.  He was seen in this office in on 8/28 after the "spell."  He was noted to be in atrial fibrillation with rate control.  He did not feel the atrial fibrillation.  Presyncopal episode was thought to be possible related to postural hypotension.  HCTZ was stopped and he has had no further dizziness.     Due to breakthrough atrial fibrillation, he was transitioned from sotalol to flecainide.    In 12/20, he had COVID-19 PNA, was hospitalized for about a week.   He returns for followup of atrial fibrillation.  He is in NSR today. No palpitations.  No lightheadedness.  No BRBPR/melena. No exertional dyspnea or chest pain. Weight up 7 lbs. No BRBPR/melena. He walks for exercise => walks up an 8 level parking deck at his work.   Labs (10/19): K 3.9,  creatinine 0.72, hgb 13.5 Labs (1/20): K 4.3, creatinine 0.81 Labs (5/20): LDL 58, HDL 51 Labs (8/22): LDL 58, hgb 13.6, K 4.1, creatinine 0.86 Labs (2/23): K 4.2, creatinine 0.92 Labs (8/23): K 3.9, creatinine 0.87, hgb 13.7, LDL 63  ECG (personally reviewed): NSR, LAFB, RBBB  PMH: 1. Atrial fibrillation: Paroxysmal.  - ETT 3/19 was normal.  - Echo (5/18): EF 60%, RV normal.  2. HTN 3. Type 2 diabetes 4. Hyperlipidemia 5. COVID-19 PNA in 12/20.  6. OSA: Uses CPAP.  7. GERD with esophagitis.  8. PVCs: 7 day Zio in 5/23 with occasional PVCs, no AF.   Current Outpatient Medications  Medication Sig Dispense Refill   amLODipine (NORVASC) 10 MG tablet TAKE 1 TABLET(10 MG) BY MOUTH DAILY 90 tablet 3   atorvastatin (LIPITOR) 10 MG tablet TAKE 1/2 TABLET(5 MG) BY MOUTH DAILY 45 tablet 3   cyanocobalamin 1000 MCG tablet Take 1 tablet by mouth daily.     ELIQUIS 5 MG TABS tablet TAKE 1 TABLET(5 MG) BY MOUTH TWICE DAILY 60 tablet 5   ferrous sulfate 325 (65 FE) MG tablet Take 1 tablet by mouth daily.     flecainide (TAMBOCOR) 50 MG tablet TAKE 1 TABLET(50 MG) BY MOUTH TWICE DAILY 180 tablet 3   fluticasone (FLONASE) 50 MCG/ACT nasal spray Place 1  spray into the nose daily as needed.     lisinopril (ZESTRIL) 40 MG tablet TAKE 1 TABLET(40 MG) BY MOUTH DAILY 60 tablet 4   loratadine (CLARITIN) 10 MG tablet Take 10 mg by mouth daily as needed for allergies.     metFORMIN (GLUCOPHAGE) 500 MG tablet Take 500 mg by mouth 2 (two) times daily with a meal.     metoprolol succinate (TOPROL-XL) 50 MG 24 hr tablet TAKE 1 TABLET(50 MG) BY MOUTH DAILY WITH OR IMMEDIATELY FOLLOWING A MEAL 90 tablet 3   ONE TOUCH ULTRA TEST test strip CHECK BLOOD SUGAR ONCE D  5   pantoprazole (PROTONIX) 40 MG tablet Take 40 mg by mouth 2 (two) times daily.     sildenafil (REVATIO) 20 MG tablet Take 80 mg by mouth as needed (ED). Take as directed     No current facility-administered medications for this encounter.     Allergies:   Penicillins   Social History:  The patient  reports that he has never smoked. He has been exposed to tobacco smoke. He has never used smokeless tobacco. He reports current alcohol use. He reports that he does not use drugs.   Family History:  The patient's family history includes Diabetes in his mother and another family member; Heart disease in his maternal aunt; Hypertension in his father; Other in his father; Stroke in his father.   ROS:  Please see the history of present illness.   All other systems are personally reviewed and negative.   Exam:  BP (!) 92/50   Pulse 61   Wt 111.1 kg (245 lb)   SpO2 98%   BMI 33.23 kg/m  General: NAD Neck: No JVD, no thyromegaly or thyroid nodule.  Lungs: Clear to auscultation bilaterally with normal respiratory effort. CV: Nondisplaced PMI.  Heart regular S1/S2, no S3/S4, no murmur.  No peripheral edema.  No carotid bruit.  Normal pedal pulses.  Abdomen: Soft, nontender, no hepatosplenomegaly, no distention.  Skin: Intact without lesions or rashes.  Neurologic: Alert and oriented x 3.  Psych: Normal affect. Extremities: No clubbing or cyanosis.  HEENT: Normal. \  Recent Labs: 11/09/2022: BUN 12; Creatinine, Ser 0.97; Hemoglobin 14.5; Platelets 269; Potassium 4.3; Sodium 139  Personally reviewed   Wt Readings from Last 3 Encounters:  11/09/22 111.1 kg (245 lb)  04/20/22 109.2 kg (240 lb 12.8 oz)  03/29/22 108 kg (238 lb 3.2 oz)     ASSESSMENT AND PLAN:  1. Paroxysmal atrial fibrillation:  Multiple atrial fibrillation risk factors including HTN, diabetes, and OSA.  No recent palpitations, he is in NSR today. Flecainide seems to be controlling atrial fibrillation better than sotalol.  ETT on flecainide in 3/19 looked ok.  - Continue flecainide.  - Continue Toprol XL 50 mg daily.  - Continue Eliquis, CBC today.   - If he develops breakthrough atrial fibrillation, ablation would be an option.    2. HTN: Controlled.  -  Continue amlodipine and lisinopril. BMET today.  3. Hyperlipidemia: Good lipids in 8/23.  - Continue atorvastatin.   4. OSA: Continue to use CPAP.   5. Obesity: Continue to work on weight loss.   6. PVCs: No palpitations.  Zio monitor in 5/23 showed infrequent PVCs.   Followup in 6 months.   Signed, Loralie Champagne, MD  11/09/2022  [facility] [address] [facility] [place] [address] [phone] (office) [phone] (fax)

## 2022-11-09 NOTE — Patient Instructions (Signed)
There has been no changes to your medications.  Labs done today, your results will be available in MyChart, we will contact you for abnormal readings.  Your physician recommends that you schedule a follow-up appointment in: 6 months ( June 2024)  ** please call the office in March to arrange your follow up appointment **  If you have any questions or concerns before your next appointment please send us a message through mychart or call our office at [phone].    TO LEAVE A MESSAGE FOR THE NURSE SELECT OPTION 2, PLEASE LEAVE A MESSAGE INCLUDING: YOUR NAME DATE OF BIRTH CALL BACK NUMBER REASON FOR CALL**this is important as we prioritize the call backs  YOU WILL RECEIVE A CALL BACK THE SAME DAY AS LONG AS YOU CALL BEFORE 4:00 PM  At the [facility], you and your health needs are our priority. As part of our continuing mission to provide you with exceptional heart care, we have created designated Provider Care Teams. These Care Teams include your primary Cardiologist (physician) and Advanced Practice Providers (APPs- Physician Assistants and Nurse Practitioners) who all work together to provide you with the care you need, when you need it.   You may see any of the following providers on your designated Care Team at your next follow up: Dr Daniel Bensimhon Dr Dalton McLean Dr. Aditya Sabharwal Amy Clegg, NP Brittainy Simmons, PA Jessica Milford,NP Lindsay Finch, PA Alma Diaz, NP Lauren Kemp, PharmD   Please be sure to bring in all your medications bottles to every appointment.    

## 2023-01-12 DIAGNOSIS — E1129 Type 2 diabetes mellitus with other diabetic kidney complication: Secondary | ICD-10-CM | POA: Diagnosis not present

## 2023-01-12 DIAGNOSIS — E538 Deficiency of other specified B group vitamins: Secondary | ICD-10-CM | POA: Diagnosis not present

## 2023-01-12 DIAGNOSIS — D509 Iron deficiency anemia, unspecified: Secondary | ICD-10-CM | POA: Diagnosis not present

## 2023-01-12 DIAGNOSIS — R809 Proteinuria, unspecified: Secondary | ICD-10-CM | POA: Diagnosis not present

## 2023-01-16 ENCOUNTER — Other Ambulatory Visit (HOSPITAL_COMMUNITY): Payer: Self-pay

## 2023-01-16 ENCOUNTER — Telehealth: Payer: Self-pay | Admitting: Family Medicine

## 2023-01-16 DIAGNOSIS — I480 Paroxysmal atrial fibrillation: Secondary | ICD-10-CM

## 2023-01-16 MED ORDER — METOPROLOL SUCCINATE ER 50 MG PO TB24: ORAL_TABLET | ORAL | 3 refills | Status: DC

## 2023-01-16 MED ORDER — LISINOPRIL 40 MG PO TABS: 40.0000 mg | ORAL_TABLET | Freq: Every day | ORAL | 3 refills | Status: DC

## 2023-01-16 MED ORDER — AMLODIPINE BESYLATE 10 MG PO TABS: ORAL_TABLET | ORAL | 3 refills | Status: DC

## 2023-01-16 MED ORDER — FLECAINIDE ACETATE 50 MG PO TABS: ORAL_TABLET | ORAL | 3 refills | Status: DC

## 2023-01-16 MED ORDER — APIXABAN 5 MG PO TABS
5.0000 mg | ORAL_TABLET | Freq: Two times a day (BID) | ORAL | 3 refills | Status: DC
  Filled 2023-12-14: qty 180, 90d supply, fill #0

## 2023-01-16 MED ORDER — ATORVASTATIN CALCIUM 10 MG PO TABS: ORAL_TABLET | ORAL | 3 refills | Status: DC

## 2023-01-16 NOTE — Telephone Encounter (Signed)
That's fine

## 2023-01-16 NOTE — Telephone Encounter (Signed)
Mr. Boltz' neighbor, Katheren Puller, is a patient of Dr. Irene Limbo and recommended he give [name] a call to see if he can become a patient. He was advised that provider is not accepting new patients of all ages so will have to inquire. Please advise if okay to accept him as new patient.

## 2023-01-31 ENCOUNTER — Other Ambulatory Visit (HOSPITAL_BASED_OUTPATIENT_CLINIC_OR_DEPARTMENT_OTHER): Payer: Self-pay

## 2023-01-31 ENCOUNTER — Encounter: Payer: Self-pay | Admitting: Family Medicine

## 2023-01-31 ENCOUNTER — Ambulatory Visit (INDEPENDENT_AMBULATORY_CARE_PROVIDER_SITE_OTHER): Payer: Medicare HMO | Admitting: Family Medicine

## 2023-01-31 VITALS — BP 130/82 | HR 78 | Temp 98.5°F | Ht 72.0 in | Wt 243.4 lb

## 2023-01-31 DIAGNOSIS — R051 Acute cough: Secondary | ICD-10-CM

## 2023-01-31 DIAGNOSIS — K219 Gastro-esophageal reflux disease without esophagitis: Secondary | ICD-10-CM | POA: Diagnosis not present

## 2023-01-31 DIAGNOSIS — E1165 Type 2 diabetes mellitus with hyperglycemia: Secondary | ICD-10-CM | POA: Diagnosis not present

## 2023-01-31 DIAGNOSIS — E7800 Pure hypercholesterolemia, unspecified: Secondary | ICD-10-CM | POA: Diagnosis not present

## 2023-01-31 MED ORDER — FLUTICASONE PROPIONATE 50 MCG/ACT NA SUSP
2.0000 | Freq: Every day | NASAL | 2 refills | Status: DC
  Filled 2023-01-31: qty 16, 30d supply, fill #0

## 2023-01-31 MED ORDER — PROMETHAZINE-DM 6.25-15 MG/5ML PO SYRP
5.0000 mL | ORAL_SOLUTION | Freq: Four times a day (QID) | ORAL | 0 refills | Status: DC | PRN
  Filled 2023-01-31: qty 118, 6d supply, fill #0

## 2023-01-31 MED ORDER — TIRZEPATIDE 2.5 MG/0.5ML ~~LOC~~ SOAJ
2.5000 mg | SUBCUTANEOUS | 0 refills | Status: AC
  Filled 2023-01-31: qty 2, 28d supply, fill #0

## 2023-01-31 MED ORDER — TIRZEPATIDE 7.5 MG/0.5ML ~~LOC~~ SOAJ
7.5000 mg | SUBCUTANEOUS | 0 refills | Status: DC
  Filled 2023-01-31: qty 2, 28d supply, fill #0

## 2023-01-31 MED ORDER — TIRZEPATIDE 5 MG/0.5ML ~~LOC~~ SOAJ
5.0000 mg | SUBCUTANEOUS | 0 refills | Status: DC
  Filled 2023-01-31 – 2023-03-16 (×2): qty 2, 28d supply, fill #0

## 2023-01-31 NOTE — Progress Notes (Signed)
Chief Complaint  Patient presents with   New Patient (Initial Visit)    Discuss Metformin Cough at night       New Patient Visit SUBJECTIVE: HPI: George Carr is an 67 y.o.male who is being seen for establishing care.  The patient was previously seen at [facility].  DM- last A1c was 7.5 on 2/15. Sugars running low-mid 100's. Diet is OK. Exercise is limited. No low's. No CP or SOB. Taking Lipitor daily.  He is taking metformin 500 mg twice daily.  He was on Ozempic previously.  He would like to lose weight and is interested in some sort of injection that might help.  GERD Taking Protonix 40 mg/d. Reports compliance, no AE's. S/s's controlled. No bleeding, unintentional wt loss, difficulty swallowing. No known triggers, never had any s/s's.  He had an upper endoscopy and was placed on Protonix by his GI doc.  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)    Obesity, unspecified    Type II or unspecified type diabetes mellitus without mention of complication, not stated as uncontrolled    Past Surgical History:  Procedure Laterality Date   cornea radial keratotomy Bilateral 11/29/2003   EP IMPLANTABLE DEVICE N/A 09/09/2016   Procedure: Loop Recorder Insertion;  Surgeon: Thompson Grayer, MD;  Location: [facility];  Service: Cardiovascular;  Laterality: N/A;   implantable loop recorder removal  04/20/2022   MDT Reveal LINQ removed for battery at RRT   KNEE ARTHROTOMY Right 11/28/1976   NASAL SEPTOPLASTY W/ TURBINOPLASTY  11/28/1997   Family History  Problem Relation Age of Onset   Hypertension Father    Other Father        pat/mat uncles, malignant neoplasm of the prostate gland   Stroke Father    Diabetes Mother    Heart disease Maternal Aunt    Diabetes Other    Allergies  Allergen Reactions   Penicillins Hives    Has patient had a PCN reaction causing immediate  rash, facial/tongue/throat swelling, SOB or lightheadedness with hypotension: Yes Has patient had a PCN reaction causing severe rash involving mucus membranes or skin necrosis: No Has patient had a PCN reaction that required hospitalization No Has patient had a PCN reaction occurring within the last 10 years: No If all of the above answers are "NO", then may proceed with Cephalosporin use.     Current Outpatient Medications:    amLODipine (NORVASC) 10 MG tablet, TAKE 1 TABLET(10 MG) BY MOUTH DAILY, Disp: 90 tablet, Rfl: 3   apixaban (ELIQUIS) 5 MG TABS tablet, TAKE 1 TABLET(5 MG) BY MOUTH TWICE DAILY, Disp: 180 tablet, Rfl: 3   atorvastatin (LIPITOR) 10 MG tablet, TAKE 1/2 TABLET(5 MG) BY MOUTH DAILY, Disp: 45 tablet, Rfl: 3   cyanocobalamin 1000 MCG tablet, Take 1 tablet by mouth daily., Disp: , Rfl:    ferrous sulfate 325 (65 FE) MG tablet, Take 1 tablet by mouth daily., Disp: , Rfl:    flecainide (TAMBOCOR) 50 MG tablet, TAKE 1 TABLET(50 MG) BY MOUTH TWICE DAILY, Disp: 180 tablet, Rfl: 3   fluticasone (FLONASE) 50 MCG/ACT nasal spray, Place 2 sprays into both nostrils daily., Disp: 16 g, Rfl: 2   lisinopril (ZESTRIL) 40 MG tablet, Take 1 tablet (40 mg total) by mouth daily., Disp: 90 tablet, Rfl: 3   loratadine (CLARITIN) 10 MG tablet, Take 10 mg by mouth daily as needed for  allergies., Disp: , Rfl:    metFORMIN (GLUCOPHAGE) 500 MG tablet, Take 500 mg by mouth 2 (two) times daily with a meal., Disp: , Rfl:    metoprolol succinate (TOPROL-XL) 50 MG 24 hr tablet, TAKE 1 TABLET(50 MG) BY MOUTH DAILY WITH OR IMMEDIATELY FOLLOWING A MEAL, Disp: 90 tablet, Rfl: 3   ONE TOUCH ULTRA TEST test strip, CHECK BLOOD SUGAR ONCE D, Disp: , Rfl: 5   pantoprazole (PROTONIX) 40 MG tablet, Take 40 mg by mouth 2 (two) times daily., Disp: , Rfl:    promethazine-dextromethorphan (PROMETHAZINE-DM) 6.25-15 MG/5ML syrup, Take 5 mLs by mouth 4 (four) times daily as needed for cough., Disp: 118 mL, Rfl: 0    tirzepatide (MOUNJARO) 2.5 MG/0.5ML Pen, Inject 2.5 mg into the skin once a week for 28 days., Disp: 2 mL, Rfl: 0   [START ON 02/28/2023] tirzepatide (MOUNJARO) 5 MG/0.5ML Pen, Inject 5 mg into the skin once a week for 28 days., Disp: 2 mL, Rfl: 0   [START ON 03/28/2023] tirzepatide (MOUNJARO) 7.5 MG/0.5ML Pen, Inject 7.5 mg into the skin once a week., Disp: 2 mL, Rfl: 0  OBJECTIVE: BP 130/82 (BP Location: Left Arm, Patient Position: Sitting, Cuff Size: Large)   Pulse 78   Temp 98.5 F (36.9 C) (Oral)   Ht 6' (1.829 m)   Wt 243 lb 6 oz (110.4 kg)   SpO2 97%   BMI 33.01 kg/m  General:  well developed, well nourished, in no apparent distress Skin:  no significant moles, warts, or growths Lungs:  clear to auscultation, breath sounds equal bilaterally, no respiratory distress Cardio:  regular rate and rhythm, no LE edema or bruits Neuro:  gait normal Psych: well oriented with normal range of affect and appropriate judgment/insight  ASSESSMENT/PLAN: Type 2 diabetes mellitus with hyperglycemia, without long-term current use of insulin (HCC) - Plan: tirzepatide (MOUNJARO) 7.5 MG/0.5ML Pen, tirzepatide (MOUNJARO) 5 MG/0.5ML Pen, tirzepatide (MOUNJARO) 2.5 MG/0.5ML Pen  Acute cough  Gastroesophageal reflux disease, unspecified whether esophagitis present  Pure hypercholesterolemia  Chronic, technically controlled.  Hopefully we can stop metformin and start Mounjaro at 2.5 mg weekly and slowly uptitrate.  Counseled on diet and exercise.  Continue to monitor sugars at home. Likely due to upper airway cough syndrome, Flonase prescribed.  Cough syrup as needed for the first couple days.  Warned about drowsiness. Chronic, stable.  Continue Protonix 40 mg daily. Chronic, probably stable.  Continue Lipitor 10 mg daily. Patient should return in 5.5 mo for CPE. The patient voiced understanding and agreement to the plan.   [place], DO 01/31/23  2:31 PM

## 2023-01-31 NOTE — Patient Instructions (Addendum)
Keep the diet clean and stay active.  Check your sugars around 2-3 times per week. Alternate checking in the morning before you eat, in the afternoon and before bed. Write them down and bring it to your next appointment.   Let me know if there are cost issues with the new injection.   Aim to do some physical exertion for 150 minutes per week. This is typically divided into 5 days per week, 30 minutes per day. The activity should be enough to get your heart rate up. Anything is better than nothing if you have time constraints.  Let us know if you need anything.

## 2023-02-08 ENCOUNTER — Other Ambulatory Visit: Payer: Self-pay | Admitting: Family Medicine

## 2023-02-08 ENCOUNTER — Other Ambulatory Visit (HOSPITAL_BASED_OUTPATIENT_CLINIC_OR_DEPARTMENT_OTHER): Payer: Self-pay

## 2023-02-08 MED ORDER — PROMETHAZINE-DM 6.25-15 MG/5ML PO SYRP
5.0000 mL | ORAL_SOLUTION | Freq: Four times a day (QID) | ORAL | 0 refills | Status: DC | PRN
  Filled 2023-02-08: qty 118, 6d supply, fill #0

## 2023-03-08 ENCOUNTER — Telehealth: Payer: Self-pay | Admitting: Family Medicine

## 2023-03-08 NOTE — Telephone Encounter (Signed)
Contacted George Carr to schedule their annual wellness visit. Appointment made for 03/16/2023.  Verlee Rossetti; Care Guide Ambulatory Clinical Support [place] [facility] Direct Dial: [phone]

## 2023-03-16 ENCOUNTER — Ambulatory Visit (INDEPENDENT_AMBULATORY_CARE_PROVIDER_SITE_OTHER): Payer: Medicare HMO | Admitting: *Deleted

## 2023-03-16 ENCOUNTER — Other Ambulatory Visit (HOSPITAL_BASED_OUTPATIENT_CLINIC_OR_DEPARTMENT_OTHER): Payer: Self-pay

## 2023-03-16 VITALS — Ht 72.0 in | Wt 237.0 lb

## 2023-03-16 DIAGNOSIS — Z0000 Encounter for general adult medical examination without abnormal findings: Secondary | ICD-10-CM

## 2023-03-16 NOTE — Progress Notes (Signed)
Subjective:  Pt completed ADLs, Fall risk, and SDOH during e-check in on 03/09/23.  Answers verified with pt.    George Carr is a 67 y.o. male who presents for an Initial Medicare Annual Wellness Visit.  I connected with  George Carr on 03/16/23 by a audio enabled telemedicine application and verified that I am speaking with the correct person using two identifiers.  Patient Location: Home  Provider Location: Office/Clinic  I discussed the limitations of evaluation and management by telemedicine. The patient expressed understanding and agreed to proceed.   Review of Systems     Cardiac Risk Factors include: advanced age (>97men, >66 women);male gender;diabetes mellitus;dyslipidemia;obesity (BMI >30kg/m2);hypertension     Objective:    Today's Vitals   03/16/23 6213  Weight: 237 lb (107.5 kg)  Height: 6' (1.829 m)   Body mass index is 32.14 kg/m.     03/16/2023    8:22 AM 11/11/2019   12:00 PM 11/10/2019   12:40 PM 09/09/2016    7:08 AM  Advanced Directives  Does Patient Have a Medical Advance Directive? Yes Yes Yes Yes  Type of Estate agent of [place];Living will Living will  Healthcare Power of [place];Living will  Does patient want to make changes to medical advance directive?  No - Patient declined    Copy of Healthcare Power of Attorney in Chart? No - copy requested   No - copy requested    Current Medications (verified) Outpatient Encounter Medications as of 03/16/2023  Medication Sig   amLODipine (NORVASC) 10 MG tablet TAKE 1 TABLET(10 MG) BY MOUTH DAILY   apixaban (ELIQUIS) 5 MG TABS tablet TAKE 1 TABLET(5 MG) BY MOUTH TWICE DAILY   atorvastatin (LIPITOR) 10 MG tablet TAKE 1/2 TABLET(5 MG) BY MOUTH DAILY   cyanocobalamin 1000 MCG tablet Take 1 tablet by mouth daily.   ferrous sulfate 325 (65 FE) MG tablet Take 1 tablet by mouth daily.   flecainide (TAMBOCOR) 50 MG tablet TAKE 1 TABLET(50 MG) BY MOUTH TWICE DAILY    fluticasone (FLONASE) 50 MCG/ACT nasal spray Place 2 sprays into both nostrils daily.   lisinopril (ZESTRIL) 40 MG tablet Take 1 tablet (40 mg total) by mouth daily.   loratadine (CLARITIN) 10 MG tablet Take 10 mg by mouth daily as needed for allergies.   metFORMIN (GLUCOPHAGE) 500 MG tablet Take 500 mg by mouth 2 (two) times daily with a meal.   metoprolol succinate (TOPROL-XL) 50 MG 24 hr tablet TAKE 1 TABLET(50 MG) BY MOUTH DAILY WITH OR IMMEDIATELY FOLLOWING A MEAL   ONE TOUCH ULTRA TEST test strip CHECK BLOOD SUGAR ONCE D   pantoprazole (PROTONIX) 40 MG tablet Take 40 mg by mouth 2 (two) times daily.   promethazine-dextromethorphan (PROMETHAZINE-DM) 6.25-15 MG/5ML syrup Take 5 mLs by mouth 4 (four) times daily as needed for cough.   tirzepatide [facility]) 5 MG/0.5ML Pen Inject 5 mg into the skin once a week for 28 days.   [START ON 03/28/2023] tirzepatide (MOUNJARO) 7.5 MG/0.5ML Pen Inject 7.5 mg into the skin once a week.   No facility-administered encounter medications on file as of 03/16/2023.    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)    Obesity, unspecified    Sleep apnea    Type II or unspecified type diabetes mellitus without mention of complication, not stated as  uncontrolled    Past Surgical History:  Procedure Laterality Date   cornea radial keratotomy Bilateral 11/29/2003   EP IMPLANTABLE DEVICE N/A 09/09/2016   Procedure: Loop Recorder Insertion;  Surgeon: Hillis Range, MD;  Location: [facility];  Service: Cardiovascular;  Laterality: N/A;   implantable loop recorder removal  04/20/2022   MDT Reveal LINQ removed for battery at [facility]   KNEE ARTHROTOMY Right 11/28/1976   NASAL SEPTOPLASTY W/ TURBINOPLASTY  11/28/1997   Family History  Problem Relation Age of Onset   Hypertension Father    Other  Father        pat/mat uncles, malignant neoplasm of the prostate gland   Stroke Father    Diabetes Mother    Heart disease Maternal Aunt    Diabetes Other    Cancer Brother    Social History   Socioeconomic History   Marital status: Married    Spouse name: Not on file   Number of children: Not on file   Years of education: Not on file   Highest education level: Not on file  Occupational History   Not on file  Tobacco Use   Smoking status: Never    Passive exposure: Yes   Smokeless tobacco: Never  Vaping Use   Vaping Use: Never used  Substance and Sexual Activity   Alcohol use: Yes    Comment: seldom   Drug use: No   Sexual activity: Yes    Partners: Female  Other Topics Concern   Not on file  Social History Narrative   Not on file   Social Determinants of Health   Financial Resource Strain: Low Risk  (03/09/2023)   Overall Financial Resource Strain (CARDIA)    Difficulty of Paying Living Expenses: Not hard at all  Food Insecurity: No Food Insecurity (03/09/2023)   Hunger Vital Sign    Worried About Running Out of Food in the Last Year: Never true    Ran Out of Food in the Last Year: Never true  Transportation Needs: No Transportation Needs (03/09/2023)   PRAPARE - Administrator, Civil Service (Medical): No    Lack of Transportation (Non-Medical): No  Physical Activity: Insufficiently Active (03/09/2023)   Exercise Vital Sign    Days of Exercise per Week: 2 days    Minutes of Exercise per Session: 20 min  Stress: No Stress Concern Present (03/09/2023)   [employer] of Occupational Health - Occupational Stress Questionnaire    Feeling of Stress : Not at all  Social Connections: Unknown (03/09/2023)   Social Connection and Isolation Panel [NHANES]    Frequency of Communication with Friends and Family: More than three times a week    Frequency of Social Gatherings with Friends and Family: Once a week    Attends Religious Services: Not on Environmental health practitioner or Organizations: Yes    Attends Banker Meetings: 1 to 4 times per year    Marital Status: Married    Tobacco Counseling Counseling given: Not Answered   Clinical Intake:  Pre-visit preparation completed: Yes  Pain : No/denies pain  BMI - recorded: 32.14 Nutritional Status: BMI > 30  Obese Nutritional Risks: None Diabetes: Yes CBG done?: No Did pt. bring in CBG monitor from home?: No  How often do you need to have someone help you when you read instructions, pamphlets, or other written materials from your doctor or pharmacy?: 1 - Never   Activities of Daily  Living    03/09/2023    6:33 PM  In your present state of health, do you have any difficulty performing the following activities:  Hearing? 0  Vision? 0  Difficulty concentrating or making decisions? 0  Walking or climbing stairs? 0  Dressing or bathing? 0  Doing errands, shopping? 0  Preparing Food and eating ? N  Using the Toilet? N  In the past six months, have you accidently leaked urine? Y  Do you have problems with loss of bowel control? N  Managing your Medications? N  Managing your Finances? N  Housekeeping or managing your Housekeeping? N    Patient Care Team: Sharlene Dory, DO as PCP - General (Family Medicine)  Indicate any recent Medical Services you may have received from other than [facility] providers in the past year (date may be approximate).     Assessment:   This is a routine wellness examination for [place].  Hearing/Vision screen No results found.  Dietary issues and exercise activities discussed: Current Exercise Habits: The patient has a physically strenuous job, but has no regular exercise apart from work. (works in [employer]), Exercise limited by: None identified   Goals Addressed   None    Depression Screen    03/16/2023    8:24 AM 01/31/2023    1:39 PM  PHQ 2/9 Scores  PHQ - 2 Score 0 0  PHQ- 9 Score  0    Fall Risk     03/09/2023    6:33 PM 01/31/2023    1:51 PM 01/31/2023    1:39 PM  Fall Risk   Falls in the past year? 0 0 0  Number falls in past yr: 0 0 0  Injury with Fall? 0 0   Risk for fall due to : No Fall Risks  No Fall Risks  Follow up Falls evaluation completed  Falls evaluation completed    FALL RISK PREVENTION PERTAINING TO THE HOME:  Any stairs in or around the home? No  Home free of loose throw rugs in walkways, pet beds, electrical cords, etc? Yes  Adequate lighting in your home to reduce risk of falls? Yes   ASSISTIVE DEVICES UTILIZED TO PREVENT FALLS:  Life alert? No  Use of a cane, walker or w/c? No  Grab bars in the bathroom? No  Shower chair or bench in shower?  Built in seat Elevated toilet seat or a handicapped toilet?  Comfort height  TIMED UP AND GO:  Was the test performed?  No, audio visit .   Cognitive Function:        03/16/2023    8:30 AM  6CIT Screen  What Year? 0 points  What month? 0 points  What time? 0 points  Count back from 20 0 points  Months in reverse 0 points  Repeat phrase 0 points  Total Score 0 points    Immunizations Immunization History  Administered Date(s) Administered   Influenza,inj,Quad PF,6+ Mos 10/07/2017, 10/08/2018, 10/08/2018, 09/11/2019, 09/11/2019   Influenza-Unspecified 10/07/2017, 10/08/2018, 09/11/2019, 09/26/2022   PFIZER Comirnaty(Gray Top)Covid-19 Tri-Sucrose Vaccine 09/20/2020   PFIZER(Purple Top)SARS-COV-2 Vaccination 02/12/2020, 03/04/2020   PNEUMOCOCCAL CONJUGATE-20 07/14/2022   Pneumococcal Polysaccharide-23 04/16/2010   Td 09/10/1999   Td (Adult),5 Lf Tetanus Toxid, Preservative Free 07/09/2021   Tdap 04/16/2010   Zoster Recombinat (Shingrix) 06/17/2020, 08/17/2020    TDAP status: Up to date  Flu Vaccine status: Up to date  Pneumococcal vaccine status: Up to date  Covid-19 vaccine status: Information provided on how to  obtain vaccines.   Qualifies for Shingles Vaccine? Yes   Zostavax completed No    Shingrix Completed?: Yes  Screening Tests Health Maintenance  Topic Date Due   OPHTHALMOLOGY EXAM  02/26/2023   Diabetic kidney evaluation - Urine ACR  07/13/2023 (Originally 09/11/1974)   COVID-19 Vaccine (4 - 2023-24 season) 08/03/2023 (Originally 07/29/2022)   FOOT EXAM  06/02/2023   INFLUENZA VACCINE  06/29/2023   HEMOGLOBIN A1C  07/13/2023   Medicare Annual Wellness (AWV)  07/15/2023   Diabetic kidney evaluation - eGFR measurement  11/10/2023   DTaP/Tdap/Td (4 - Td or Tdap) 07/10/2031   COLONOSCOPY (Pts 45-78yrs Insurance coverage will need to be confirmed)  01/27/2032   Pneumonia Vaccine 82+ Years old  Completed   Hepatitis C Screening  Completed   Zoster Vaccines- Shingrix  Completed   HPV VACCINES  Aged Out    Health Maintenance  Health Maintenance Due  Topic Date Due   OPHTHALMOLOGY EXAM  02/26/2023    Colorectal cancer screening: Type of screening: Colonoscopy. Completed 01/26/22. Repeat every 10 years  Lung Cancer Screening: (Low Dose CT Chest recommended if Age 65-80 years, 30 pack-year currently smoking OR have quit w/in 15years.) does not qualify.   Additional Screening:  Hepatitis C Screening: does qualify; Completed 07/12/22  Vision Screening: Recommended annual ophthalmology exams for early detection of glaucoma and other disorders of the eye. Is the patient up to date with their annual eye exam?  Yes  Who is the provider or what is the name of the office in which the patient attends annual eye exams? [facility] If pt is not established with a provider, would they like to be referred to a provider to establish care? No .   Dental Screening: Recommended annual dental exams for proper oral hygiene  Community Resource Referral / Chronic Care Management: CRR required this visit?  No   CCM required this visit?  No      Plan:     I have personally reviewed and noted the following in the patient's chart:   Medical and social history Use  of alcohol, tobacco or illicit drugs  Current medications and supplements including opioid prescriptions. Patient is not currently taking opioid prescriptions. Functional ability and status Nutritional status Physical activity Advanced directives List of other physicians Hospitalizations, surgeries, and ER visits in previous 12 months Vitals Screenings to include cognitive, depression, and falls Referrals and appointments  In addition, I have reviewed and discussed with patient certain preventive protocols, quality metrics, and best practice recommendations. A written personalized care plan for preventive services as well as general preventive health recommendations were provided to patient.   Due to this being a telephonic visit, the after visit summary with patients personalized plan was offered to patient via mail or my-chart. Patient would like to access on my-chart.   Donne Anon, [place]   03/16/2023   Nurse Notes: None

## 2023-03-16 NOTE — Patient Instructions (Signed)
[name] , Thank you for taking time to come for your Medicare Wellness Visit. I appreciate your ongoing commitment to your health goals. Please review the following plan we discussed and let me know if I can assist you in the future.   These are the goals we discussed:  Goals   None     This is a list of the screening recommended for you and due dates:  Health Maintenance  Topic Date Due   Eye exam for diabetics  02/26/2023   Yearly kidney health urinalysis for diabetes  07/13/2023*   COVID-19 Vaccine (4 - 2023-24 season) 08/03/2023*   Complete foot exam   06/02/2023   Flu Shot  06/29/2023   Hemoglobin A1C  07/13/2023   Yearly kidney function blood test for diabetes  11/10/2023   Medicare Annual Wellness Visit  03/15/2024   DTaP/Tdap/Td vaccine (4 - Td or Tdap) 07/10/2031   Colon Cancer Screening  01/27/2032   Pneumonia Vaccine  Completed   Hepatitis C Screening: USPSTF Recommendation to screen - Ages 16-79 yo.  Completed   Zoster (Shingles) Vaccine  Completed   HPV Vaccine  Aged Out  *Topic was postponed. The date shown is not the original due date.     Next appointment: Follow up in one year for your annual wellness visit.   Preventive Care 67 Years and Older, Male Preventive care refers to lifestyle choices and visits with your health care provider that can promote health and wellness. What does preventive care include? A yearly physical exam. This is also called an annual well check. Dental exams once or twice a year. Routine eye exams. Ask your health care provider how often you should have your eyes checked. Personal lifestyle choices, including: Daily care of your teeth and gums. Regular physical activity. Eating a healthy diet. Avoiding tobacco and drug use. Limiting alcohol use. Practicing safe sex. Taking low doses of aspirin every day. Taking vitamin and mineral supplements as recommended by your health care provider. What happens during an annual well  check? The services and screenings done by your health care provider during your annual well check will depend on your age, overall health, lifestyle risk factors, and family history of disease. Counseling  Your health care provider may ask you questions about your: Alcohol use. Tobacco use. Drug use. Emotional well-being. Home and relationship well-being. Sexual activity. Eating habits. History of falls. Memory and ability to understand (cognition). Work and work Astronomer. Screening  You may have the following tests or measurements: Height, weight, and BMI. Blood pressure. Lipid and cholesterol levels. These may be checked every 5 years, or more frequently if you are over 42 years old. Skin check. Lung cancer screening. You may have this screening every year starting at age 80 if you have a 30-pack-year history of smoking and currently smoke or have quit within the past 15 years. Fecal occult blood test (FOBT) of the stool. You may have this test every year starting at age 24. Flexible sigmoidoscopy or colonoscopy. You may have a sigmoidoscopy every 5 years or a colonoscopy every 10 years starting at age 67. Prostate cancer screening. Recommendations will vary depending on your family history and other risks. Hepatitis C blood test. Hepatitis B blood test. Sexually transmitted disease (STD) testing. Diabetes screening. This is done by checking your blood sugar (glucose) after you have not eaten for a while (fasting). You may have this done every 1-3 years. Abdominal aortic aneurysm (AAA) screening. You may need this if  you are a current or former smoker. Osteoporosis. You may be screened starting at age 50 if you are at high risk. Talk with your health care provider about your test results, treatment options, and if necessary, the need for more tests. Vaccines  Your health care provider may recommend certain vaccines, such as: Influenza vaccine. This is recommended every  year. Tetanus, diphtheria, and acellular pertussis (Tdap, Td) vaccine. You may need a Td booster every 10 years. Zoster vaccine. You may need this after age 61. Pneumococcal 13-valent conjugate (PCV13) vaccine. One dose is recommended after age 71. Pneumococcal polysaccharide (PPSV23) vaccine. One dose is recommended after age 22. Talk to your health care provider about which screenings and vaccines you need and how often you need them. This information is not intended to replace advice given to you by your health care provider. Make sure you discuss any questions you have with your health care provider. Document Released: 12/11/2015 Document Revised: 08/03/2016 Document Reviewed: 09/15/2015 Elsevier Interactive Patient Education  2017 [place] Prevention in the Home Falls can cause injuries. They can happen to people of all ages. There are many things you can do to make your home safe and to help prevent falls. What can I do on the outside of my home? Regularly fix the edges of walkways and driveways and fix any cracks. Remove anything that might make you trip as you walk through a door, such as a raised step or threshold. Trim any bushes or trees on the path to your home. Use bright outdoor lighting. Clear any walking paths of anything that might make someone trip, such as rocks or tools. Regularly check to see if handrails are loose or broken. Make sure that both sides of any steps have handrails. Any raised decks and porches should have guardrails on the edges. Have any leaves, snow, or ice cleared regularly. Use sand or salt on walking paths during winter. Clean up any spills in your garage right away. This includes oil or grease spills. What can I do in the bathroom? Use night lights. Install grab bars by the toilet and in the tub and shower. Do not use towel bars as grab bars. Use non-skid mats or decals in the tub or shower. If you need to sit down in the shower, use a  plastic, non-slip stool. Keep the floor dry. Clean up any water that spills on the floor as soon as it happens. Remove soap buildup in the tub or shower regularly. Attach bath mats securely with double-sided non-slip rug tape. Do not have throw rugs and other things on the floor that can make you trip. What can I do in the bedroom? Use night lights. Make sure that you have a light by your bed that is easy to reach. Do not use any sheets or blankets that are too big for your bed. They should not hang down onto the floor. Have a firm chair that has side arms. You can use this for support while you get dressed. Do not have throw rugs and other things on the floor that can make you trip. What can I do in the kitchen? Clean up any spills right away. Avoid walking on wet floors. Keep items that you use a lot in easy-to-reach places. If you need to reach something above you, use a strong step stool that has a grab bar. Keep electrical cords out of the way. Do not use floor polish or wax that makes floors slippery. If  you must use wax, use non-skid floor wax. Do not have throw rugs and other things on the floor that can make you trip. What can I do with my stairs? Do not leave any items on the stairs. Make sure that there are handrails on both sides of the stairs and use them. Fix handrails that are broken or loose. Make sure that handrails are as long as the stairways. Check any carpeting to make sure that it is firmly attached to the stairs. Fix any carpet that is loose or worn. Avoid having throw rugs at the top or bottom of the stairs. If you do have throw rugs, attach them to the floor with carpet tape. Make sure that you have a light switch at the top of the stairs and the bottom of the stairs. If you do not have them, ask someone to add them for you. What else can I do to help prevent falls? Wear shoes that: Do not have high heels. Have rubber bottoms. Are comfortable and fit you  well. Are closed at the toe. Do not wear sandals. If you use a stepladder: Make sure that it is fully opened. Do not climb a closed stepladder. Make sure that both sides of the stepladder are locked into place. Ask someone to hold it for you, if possible. Clearly mark and make sure that you can see: Any grab bars or handrails. First and last steps. Where the edge of each step is. Use tools that help you move around (mobility aids) if they are needed. These include: Canes. Walkers. Scooters. Crutches. Turn on the lights when you go into a dark area. Replace any light bulbs as soon as they burn out. Set up your furniture so you have a clear path. Avoid moving your furniture around. If any of your floors are uneven, fix them. If there are any pets around you, be aware of where they are. Review your medicines with your doctor. Some medicines can make you feel dizzy. This can increase your chance of falling. Ask your doctor what other things that you can do to help prevent falls. This information is not intended to replace advice given to you by your health care provider. Make sure you discuss any questions you have with your health care provider. Document Released: 09/10/2009 Document Revised: 04/21/2016 Document Reviewed: 12/19/2014 Elsevier Interactive Patient Education  2017 [employer].

## 2023-03-21 ENCOUNTER — Telehealth: Payer: Self-pay | Admitting: Family Medicine

## 2023-03-21 ENCOUNTER — Other Ambulatory Visit: Payer: Self-pay | Admitting: Family Medicine

## 2023-03-21 ENCOUNTER — Other Ambulatory Visit: Payer: Self-pay

## 2023-03-21 DIAGNOSIS — E1165 Type 2 diabetes mellitus with hyperglycemia: Secondary | ICD-10-CM

## 2023-03-21 MED ORDER — TIRZEPATIDE 5 MG/0.5ML ~~LOC~~ SOAJ: 5.0000 mg | SUBCUTANEOUS | 0 refills | Status: DC

## 2023-03-21 NOTE — Telephone Encounter (Signed)
Huntley Dec With [employer] called to advise that Patient is requesting Mounjaro 5 mg prescription to be sent to [facility]. Huntley Dec said it will be sent out to him as  a 28 day supply. . Their fax # [phone]; phone number is [phone]

## 2023-03-21 NOTE — Telephone Encounter (Signed)
sent 

## 2023-03-21 NOTE — Addendum Note (Signed)
Addended by: Scharlene Gloss B on: 03/21/2023 01:05 PM   Modules accepted: Orders

## 2023-03-22 DIAGNOSIS — H35371 Puckering of macula, right eye: Secondary | ICD-10-CM | POA: Diagnosis not present

## 2023-03-22 DIAGNOSIS — H35342 Macular cyst, hole, or pseudohole, left eye: Secondary | ICD-10-CM | POA: Diagnosis not present

## 2023-03-22 DIAGNOSIS — E113291 Type 2 diabetes mellitus with mild nonproliferative diabetic retinopathy without macular edema, right eye: Secondary | ICD-10-CM | POA: Diagnosis not present

## 2023-03-22 DIAGNOSIS — H35033 Hypertensive retinopathy, bilateral: Secondary | ICD-10-CM | POA: Diagnosis not present

## 2023-03-22 MED ORDER — TIRZEPATIDE 5 MG/0.5ML ~~LOC~~ SOAJ: 5.0000 mg | SUBCUTANEOUS | 0 refills | Status: DC

## 2023-03-23 ENCOUNTER — Other Ambulatory Visit (HOSPITAL_BASED_OUTPATIENT_CLINIC_OR_DEPARTMENT_OTHER): Payer: Self-pay

## 2023-03-24 ENCOUNTER — Other Ambulatory Visit (HOSPITAL_BASED_OUTPATIENT_CLINIC_OR_DEPARTMENT_OTHER): Payer: Self-pay

## 2023-03-24 ENCOUNTER — Other Ambulatory Visit: Payer: Self-pay | Admitting: Family Medicine

## 2023-03-24 DIAGNOSIS — E1165 Type 2 diabetes mellitus with hyperglycemia: Secondary | ICD-10-CM

## 2023-03-24 MED ORDER — TIRZEPATIDE 7.5 MG/0.5ML ~~LOC~~ SOAJ: 7.5000 mg | SUBCUTANEOUS | 0 refills | Status: DC

## 2023-03-27 ENCOUNTER — Encounter: Payer: Self-pay | Admitting: Family Medicine

## 2023-03-27 ENCOUNTER — Telehealth: Payer: Self-pay | Admitting: Family Medicine

## 2023-03-27 ENCOUNTER — Other Ambulatory Visit (HOSPITAL_BASED_OUTPATIENT_CLINIC_OR_DEPARTMENT_OTHER): Payer: Self-pay

## 2023-03-27 MED ORDER — TIRZEPATIDE 10 MG/0.5ML ~~LOC~~ SOAJ
10.0000 mg | SUBCUTANEOUS | 0 refills | Status: DC
  Filled 2023-03-27: qty 2, 28d supply, fill #0

## 2023-03-27 MED ORDER — TIRZEPATIDE 5 MG/0.5ML ~~LOC~~ SOAJ
5.0000 mg | SUBCUTANEOUS | 0 refills | Status: AC
  Filled 2023-03-27: qty 2, 28d supply, fill #0

## 2023-03-27 MED ORDER — TIRZEPATIDE 7.5 MG/0.5ML ~~LOC~~ SOAJ
7.5000 mg | SUBCUTANEOUS | 0 refills | Status: DC
  Filled 2023-03-27 – 2023-05-11 (×2): qty 2, 28d supply, fill #0

## 2023-03-27 NOTE — Telephone Encounter (Signed)
Called informed of refill sent in. He stated that he just checked with the [facility] last week and they said it would be the middle of May before they get it He is going to check downstairs and if they do not have it will send PCP a my chart message to see what to do next.

## 2023-03-27 NOTE — Telephone Encounter (Signed)
Going to send 5 mg downstairs and the subsequent dosages down as well.

## 2023-03-27 NOTE — Telephone Encounter (Signed)
Patient called stating he's been trying to get Mounjaro 5.0 and he cannot find it at any other pharmacies. He stated the higher dose is also on backorder and he is unsure on what to do. Please advise.

## 2023-03-29 ENCOUNTER — Other Ambulatory Visit (HOSPITAL_BASED_OUTPATIENT_CLINIC_OR_DEPARTMENT_OTHER): Payer: Self-pay

## 2023-04-03 DIAGNOSIS — Z0100 Encounter for examination of eyes and vision without abnormal findings: Secondary | ICD-10-CM | POA: Diagnosis not present

## 2023-04-19 DIAGNOSIS — Z86010 Personal history of colonic polyps: Secondary | ICD-10-CM | POA: Diagnosis not present

## 2023-04-19 DIAGNOSIS — K2270 Barrett's esophagus without dysplasia: Secondary | ICD-10-CM | POA: Diagnosis not present

## 2023-04-19 DIAGNOSIS — K2970 Gastritis, unspecified, without bleeding: Secondary | ICD-10-CM | POA: Diagnosis not present

## 2023-05-11 ENCOUNTER — Telehealth: Payer: Self-pay | Admitting: Family Medicine

## 2023-05-11 ENCOUNTER — Other Ambulatory Visit (HOSPITAL_BASED_OUTPATIENT_CLINIC_OR_DEPARTMENT_OTHER): Payer: Self-pay

## 2023-05-11 NOTE — Telephone Encounter (Signed)
Pt called stating that the [facility] he is currently taking is no longer financially viable as his last dose was ~$286 and the pharmacy told him that his next dose would be ~$1000. Pt would like to look into going bacjk on the metformin or if there are any alternatives Dr. Carmelia Roller would recommend, he would be open to that as well. Pt stated if there is any other meds Dr. Carmelia Roller would recommend, to send him a MyChart message with the names so he can check coverage with his insurance.

## 2023-05-12 NOTE — Telephone Encounter (Signed)
Ozempic, Trulicity, [place], [place]. If none of those are covered, we can certainly return to metformin. Ty.

## 2023-05-12 NOTE — Telephone Encounter (Signed)
Advise of PCP instructions. He is going to call his insurance. Sent 4 names to check Will call back of none will work

## 2023-05-16 ENCOUNTER — Other Ambulatory Visit: Payer: Self-pay | Admitting: Family Medicine

## 2023-05-16 ENCOUNTER — Other Ambulatory Visit (HOSPITAL_BASED_OUTPATIENT_CLINIC_OR_DEPARTMENT_OTHER): Payer: Self-pay

## 2023-05-16 MED ORDER — METFORMIN HCL ER 500 MG PO TB24: 1000.0000 mg | ORAL_TABLET | Freq: Every day | ORAL | 2 refills | Status: DC

## 2023-06-14 DIAGNOSIS — M5137 Other intervertebral disc degeneration, lumbosacral region: Secondary | ICD-10-CM | POA: Diagnosis not present

## 2023-06-14 DIAGNOSIS — M9905 Segmental and somatic dysfunction of pelvic region: Secondary | ICD-10-CM | POA: Diagnosis not present

## 2023-06-14 DIAGNOSIS — M25551 Pain in right hip: Secondary | ICD-10-CM | POA: Diagnosis not present

## 2023-06-14 DIAGNOSIS — M9903 Segmental and somatic dysfunction of lumbar region: Secondary | ICD-10-CM | POA: Diagnosis not present

## 2023-06-15 DIAGNOSIS — M5137 Other intervertebral disc degeneration, lumbosacral region: Secondary | ICD-10-CM | POA: Diagnosis not present

## 2023-06-15 DIAGNOSIS — M25551 Pain in right hip: Secondary | ICD-10-CM | POA: Diagnosis not present

## 2023-06-15 DIAGNOSIS — M9903 Segmental and somatic dysfunction of lumbar region: Secondary | ICD-10-CM | POA: Diagnosis not present

## 2023-06-15 DIAGNOSIS — M9905 Segmental and somatic dysfunction of pelvic region: Secondary | ICD-10-CM | POA: Diagnosis not present

## 2023-06-19 DIAGNOSIS — M9905 Segmental and somatic dysfunction of pelvic region: Secondary | ICD-10-CM | POA: Diagnosis not present

## 2023-06-19 DIAGNOSIS — M25551 Pain in right hip: Secondary | ICD-10-CM | POA: Diagnosis not present

## 2023-06-19 DIAGNOSIS — M5137 Other intervertebral disc degeneration, lumbosacral region: Secondary | ICD-10-CM | POA: Diagnosis not present

## 2023-06-19 DIAGNOSIS — M9903 Segmental and somatic dysfunction of lumbar region: Secondary | ICD-10-CM | POA: Diagnosis not present

## 2023-06-21 DIAGNOSIS — M25551 Pain in right hip: Secondary | ICD-10-CM | POA: Diagnosis not present

## 2023-06-21 DIAGNOSIS — M9903 Segmental and somatic dysfunction of lumbar region: Secondary | ICD-10-CM | POA: Diagnosis not present

## 2023-06-21 DIAGNOSIS — M9905 Segmental and somatic dysfunction of pelvic region: Secondary | ICD-10-CM | POA: Diagnosis not present

## 2023-06-21 DIAGNOSIS — M5137 Other intervertebral disc degeneration, lumbosacral region: Secondary | ICD-10-CM | POA: Diagnosis not present

## 2023-07-05 ENCOUNTER — Telehealth: Payer: Self-pay | Admitting: Family Medicine

## 2023-07-05 NOTE — Telephone Encounter (Signed)
Pt called stating that he had tested COVID+ 8.7.24 and was wondering if Dr. Carmelia Roller had any recommendations other than prescribing Paxlovid. Pt has the following symptoms that started 8.6.24:  -Fever -Vomiting -Coughing

## 2023-07-05 NOTE — Telephone Encounter (Signed)
He needs an appointment

## 2023-07-06 ENCOUNTER — Encounter: Payer: Self-pay | Admitting: Physician Assistant

## 2023-07-06 ENCOUNTER — Telehealth (INDEPENDENT_AMBULATORY_CARE_PROVIDER_SITE_OTHER): Payer: Medicare HMO | Admitting: Physician Assistant

## 2023-07-06 DIAGNOSIS — U071 COVID-19: Secondary | ICD-10-CM | POA: Diagnosis not present

## 2023-07-06 DIAGNOSIS — R051 Acute cough: Secondary | ICD-10-CM | POA: Diagnosis not present

## 2023-07-06 MED ORDER — HYDROCOD POLI-CHLORPHE POLI ER 10-8 MG/5ML PO SUER: 5.0000 mL | Freq: Two times a day (BID) | ORAL | 0 refills | Status: DC | PRN

## 2023-07-06 NOTE — Progress Notes (Signed)
MyChart Video Visit    Virtual Visit via Video Note   This format is felt to be most appropriate for this patient at this time. Physical exam was limited by quality of the video and audio technology used for the visit.   Patient location: home Provider location: [facility]  I discussed the limitations of evaluation and management by telemedicine and the availability of in person appointments. The patient expressed understanding and agreed to proceed.  Patient: George Carr   DOB: [birth year]   67 y.o. Male  MRN: [id number] Visit Date: 07/06/2023  Today's healthcare provider: Alfredia Ferguson, PA-C   Cc. Cough, covid +  Subjective    HPI  Pt reports a cough, fever, nasal congestion, fatigue x 3 days. Reports testing positive for COVID at home yesterday 07/05/23.  Reports tmax 100.1, today 38F.  Denies SOB, wheezing. Medications: Outpatient Medications Prior to Visit  Medication Sig   amLODipine (NORVASC) 10 MG tablet TAKE 1 TABLET(10 MG) BY MOUTH DAILY   apixaban (ELIQUIS) 5 MG TABS tablet TAKE 1 TABLET(5 MG) BY MOUTH TWICE DAILY   atorvastatin (LIPITOR) 10 MG tablet TAKE 1/2 TABLET(5 MG) BY MOUTH DAILY   cyanocobalamin 1000 MCG tablet Take 1 tablet by mouth daily.   ferrous sulfate 325 (65 FE) MG tablet Take 1 tablet by mouth daily.   flecainide (TAMBOCOR) 50 MG tablet TAKE 1 TABLET(50 MG) BY MOUTH TWICE DAILY   fluticasone (FLONASE) 50 MCG/ACT nasal spray Place 2 sprays into both nostrils daily.   lisinopril (ZESTRIL) 40 MG tablet Take 1 tablet (40 mg total) by mouth daily.   loratadine (CLARITIN) 10 MG tablet Take 10 mg by mouth daily as needed for allergies.   metFORMIN (GLUCOPHAGE-XR) 500 MG 24 hr tablet Take 2 tablets (1,000 mg total) by mouth daily with breakfast.   metoprolol succinate (TOPROL-XL) 50 MG 24 hr tablet TAKE 1 TABLET(50 MG) BY MOUTH DAILY WITH OR IMMEDIATELY FOLLOWING A MEAL   ONE TOUCH ULTRA TEST test strip CHECK BLOOD SUGAR ONCE D   pantoprazole  (PROTONIX) 40 MG tablet Take 40 mg by mouth 2 (two) times daily.   [DISCONTINUED] promethazine-dextromethorphan (PROMETHAZINE-DM) 6.25-15 MG/5ML syrup Take 5 mLs by mouth 4 (four) times daily as needed for cough.   No facility-administered medications prior to visit.    Review of Systems  Constitutional:  Positive for fatigue. Negative for fever.  HENT:  Positive for congestion.   Respiratory:  Positive for cough. Negative for shortness of breath.   Cardiovascular:  Negative for chest pain, palpitations and leg swelling.  Neurological:  Negative for dizziness and headaches.       Objective    There were no vitals taken for this visit.   Physical Exam Constitutional:      Appearance: Normal appearance. He is not ill-appearing.  Neurological:     Mental Status: He is oriented to person, place, and time.  Psychiatric:        Mood and Affect: Mood normal.        Behavior: Behavior normal.        Assessment & Plan     1. COVID-19 2. Acute cough Recommending hydration, rest, antihistamines, tx tussionex for cough. D/t med interactions, unable to take paxlovid, and molnupiravir is not covered. Symptoms are mild regardless.  If fever returns, sob, or wheezing, weakness, please contact office.  - chlorpheniramine-HYDROcodone (TUSSIONEX) 10-8 MG/5ML; Take 5 mLs by mouth every 12 (twelve) hours as needed for cough.  Dispense: 115 mL;  Refill: 0   Return if symptoms worsen or fail to improve.     I discussed the assessment and treatment plan with the patient. The patient was provided an opportunity to ask questions and all were answered. The patient agreed with the plan and demonstrated an understanding of the instructions.   The patient was advised to call back or seek an in-person evaluation if the symptoms worsen or if the condition fails to improve as anticipated.  I provided 7 minutes of non-face-to-face time during this encounter.  I, Alfredia Ferguson, PA-C have reviewed  all documentation for this visit. The documentation on  07/06/23   for the exam, diagnosis, procedures, and orders are all accurate and complete.   Alfredia Ferguson, PA-C [facility] [phone] (phone) [phone] (fax)  [facility]

## 2023-07-11 ENCOUNTER — Encounter (HOSPITAL_COMMUNITY): Payer: Self-pay | Admitting: Cardiology

## 2023-07-11 ENCOUNTER — Ambulatory Visit (HOSPITAL_COMMUNITY)
Admission: RE | Admit: 2023-07-11 | Discharge: 2023-07-11 | Disposition: A | Discharge status: Home / Self Care | Payer: Medicare HMO | Source: Ambulatory Visit | Attending: Cardiology | Admitting: Cardiology

## 2023-07-11 VITALS — Wt 240.8 lb

## 2023-07-11 DIAGNOSIS — I493 Ventricular premature depolarization: Secondary | ICD-10-CM | POA: Insufficient documentation

## 2023-07-11 DIAGNOSIS — G4733 Obstructive sleep apnea (adult) (pediatric): Secondary | ICD-10-CM | POA: Insufficient documentation

## 2023-07-11 DIAGNOSIS — Z6832 Body mass index (BMI) 32.0-32.9, adult: Secondary | ICD-10-CM | POA: Insufficient documentation

## 2023-07-11 DIAGNOSIS — Z79899 Other long term (current) drug therapy: Secondary | ICD-10-CM | POA: Insufficient documentation

## 2023-07-11 DIAGNOSIS — Z8616 Personal history of COVID-19: Secondary | ICD-10-CM | POA: Diagnosis not present

## 2023-07-11 DIAGNOSIS — Z7722 Contact with and (suspected) exposure to environmental tobacco smoke (acute) (chronic): Secondary | ICD-10-CM | POA: Insufficient documentation

## 2023-07-11 DIAGNOSIS — E669 Obesity, unspecified: Secondary | ICD-10-CM | POA: Insufficient documentation

## 2023-07-11 DIAGNOSIS — Z7901 Long term (current) use of anticoagulants: Secondary | ICD-10-CM | POA: Diagnosis not present

## 2023-07-11 DIAGNOSIS — Z7984 Long term (current) use of oral hypoglycemic drugs: Secondary | ICD-10-CM | POA: Diagnosis not present

## 2023-07-11 DIAGNOSIS — R0602 Shortness of breath: Secondary | ICD-10-CM | POA: Diagnosis not present

## 2023-07-11 DIAGNOSIS — I10 Essential (primary) hypertension: Secondary | ICD-10-CM | POA: Diagnosis not present

## 2023-07-11 DIAGNOSIS — I480 Paroxysmal atrial fibrillation: Secondary | ICD-10-CM | POA: Diagnosis not present

## 2023-07-11 DIAGNOSIS — E119 Type 2 diabetes mellitus without complications: Secondary | ICD-10-CM | POA: Diagnosis not present

## 2023-07-11 DIAGNOSIS — E785 Hyperlipidemia, unspecified: Secondary | ICD-10-CM | POA: Insufficient documentation

## 2023-07-11 LAB — BASIC METABOLIC PANEL
Anion gap: 12 (ref 5–15)
BUN: 15 mg/dL (ref 8–23)
CO2: 26 mmol/L (ref 22–32)
Calcium: 9.2 mg/dL (ref 8.9–10.3)
Chloride: 98 mmol/L (ref 98–111)
Creatinine, Ser: 0.9 mg/dL (ref 0.61–1.24)
GFR, Estimated: >60 mL/min (ref >60)
Glucose, Bld: 252 mg/dL — ABNORMAL HIGH (ref 70–99)
Potassium: 4 mmol/L (ref 3.5–5.1)
Sodium: 136 mmol/L (ref 135–145)

## 2023-07-11 LAB — LIPID PANEL
Cholesterol: 100 mg/dL (ref 0–200)
HDL: 28 mg/dL — ABNORMAL LOW (ref >40)
LDL Cholesterol: 44 mg/dL (ref 0–99)
Total CHOL/HDL Ratio: 3.6 RATIO
Triglycerides: 140 mg/dL (ref <150)
VLDL: 28 mg/dL (ref 0–40)

## 2023-07-11 LAB — BRAIN NATRIURETIC PEPTIDE: B Natriuretic Peptide: 40.1 pg/mL (ref 0.0–100.0)

## 2023-07-11 NOTE — Patient Instructions (Signed)
 There has been no changes to your medications.  Labs done today, your results will be available in MyChart, we will contact you for abnormal readings.  Your physician recommends that you schedule a follow-up appointment in: 6 months ( February 2025) ** PLEASE CALL THE OFFICE IN NOVEMBER TO ARRANGE YOUR FOLLOW UP APPOINTMENT. **   If you have any questions or concerns before your next appointment please send [name] a message through [place] or call our office at [phone].    TO LEAVE A MESSAGE FOR THE NURSE SELECT OPTION 2, PLEASE LEAVE A MESSAGE INCLUDING: YOUR NAME DATE OF BIRTH CALL BACK NUMBER REASON FOR CALL**this is important as we prioritize the call backs  YOU WILL RECEIVE A CALL BACK THE SAME DAY AS LONG AS YOU CALL BEFORE 4:00 PM  At the [facility], you and your health needs are our priority. As part of our continuing mission to provide you with exceptional heart care, we have created designated Provider Care Teams. These Care Teams include your primary Cardiologist (physician) and Advanced Practice Providers (APPs- Physician Assistants and Nurse Practitioners) who all work together to provide you with the care you need, when you need it.   You may see any of the following providers on your designated Care Team at your next follow up: Dr Arvilla Meres Dr Marca Ancona Dr. Marcos Eke, NP Robbie Lis, [place] [facility] [place], [place] Brynda Peon, NP Karle Plumber, PharmD   Please be sure to bring in all your medications bottles to every appointment.    Thank you for choosing [facility]

## 2023-07-11 NOTE — Progress Notes (Signed)
ID:  George Carr, DOB 08-13-1956, MRN [id number]   Provider location: [facility] Type of Visit: Established patient   PCP:  Sharlene Dory, DO  Cardiologist: Dr. Shirlee Latch   History of Present Illness: George Carr is a 67 y.o. male who has a history of type II diabetes, HTN, and atrial fibrillation.  Atrial fibrillation was first noted in 2/15 when he went for a colonoscopy.  He was unaware of it, no symptoms.  He was started on sotalol and Xarelto, later switched to Eliquis.  He went back into NSR.  He denies exertional dyspnea or chest pain.  He has good exercise tolerance.  He tries to walk for exercise as much as he can.  He has OSA and is using CPAP.  Echo was done in 3/15, showing preserved LV systolic function.  Lexiscan Cardiolite was done in 3/15 showing no ischemia.    Patient was doing well until 07/25/14.  On that day, he had a "weak spell" while walking through his kitchen.  He did not pass out completely but felt weak and lightheaded and slumped down to the floor.  He did not feel chest pain or palpitations.  He felt back to normal after about 30 seconds.  He has not had any further episodes like this.  He was seen in this office in on 8/28 after the "spell."  He was noted to be in atrial fibrillation with rate control.  He did not feel the atrial fibrillation.  Presyncopal episode was thought to be possible related to postural hypotension.  HCTZ was stopped and he has had no further dizziness.     Due to breakthrough atrial fibrillation, he was transitioned from sotalol to flecainide.    In 12/20, he had COVID-19 PNA, was hospitalized for about a week.   He returns for followup of atrial fibrillation.  He is in NSR today.  Weight down 5 lbs.  Had COVID-19 infection recently (picked up on a trip to [place]).  No atrial fibrillation noted on his Apple Watch and no palpitations.  No exertional dyspnea or chest pain.  BP controlled.    Labs  (10/19): K 3.9, creatinine 0.72, hgb 13.5 Labs (1/20): K 4.3, creatinine 0.81 Labs (5/20): LDL 58, HDL 51 Labs (8/22): LDL 58, hgb 13.6, K 4.1, creatinine 0.86 Labs (2/23): K 4.2, creatinine 0.92 Labs (8/23): K 3.9, creatinine 0.87, hgb 13.7, LDL 63 Labs (2/24): hgb 14.3  ECG (personally reviewed): NSR, RBBB, LAFB  PMH: 1. Atrial fibrillation: Paroxysmal.  - ETT 3/19 was normal.  - Echo (5/18): EF 60%, RV normal.  2. HTN 3. Type 2 diabetes 4. Hyperlipidemia 5. COVID-19 PNA in 12/20.  6. OSA: Uses CPAP.  7. GERD with esophagitis.  8. PVCs: 7 day Zio in 5/23 with occasional PVCs, no AF.   Current Outpatient Medications  Medication Sig Dispense Refill   amLODipine (NORVASC) 10 MG tablet TAKE 1 TABLET(10 MG) BY MOUTH DAILY 90 tablet 3   apixaban (ELIQUIS) 5 MG TABS tablet TAKE 1 TABLET(5 MG) BY MOUTH TWICE DAILY 180 tablet 3   atorvastatin (LIPITOR) 10 MG tablet TAKE 1/2 TABLET(5 MG) BY MOUTH DAILY 45 tablet 3   chlorpheniramine-HYDROcodone (TUSSIONEX) 10-8 MG/5ML Take 5 mLs by mouth every 12 (twelve) hours as needed for cough. 115 mL 0   cyanocobalamin 1000 MCG tablet Take 1 tablet by mouth daily.     ferrous sulfate 325 (65 FE) MG tablet Take 1 tablet  by mouth daily.     flecainide (TAMBOCOR) 50 MG tablet TAKE 1 TABLET(50 MG) BY MOUTH TWICE DAILY 180 tablet 3   fluticasone (FLONASE) 50 MCG/ACT nasal spray Place 2 sprays into both nostrils daily. 16 g 2   lisinopril (ZESTRIL) 40 MG tablet Take 1 tablet (40 mg total) by mouth daily. 90 tablet 3   loratadine (CLARITIN) 10 MG tablet Take 10 mg by mouth daily as needed for allergies.     metFORMIN (GLUCOPHAGE-XR) 500 MG 24 hr tablet Take 2 tablets (1,000 mg total) by mouth daily with breakfast. 180 tablet 2   metoprolol succinate (TOPROL-XL) 50 MG 24 hr tablet TAKE 1 TABLET(50 MG) BY MOUTH DAILY WITH OR IMMEDIATELY FOLLOWING A MEAL 90 tablet 3   ONE TOUCH ULTRA TEST test strip CHECK BLOOD SUGAR ONCE D  5   pantoprazole (PROTONIX) 40 MG  tablet Take 40 mg by mouth 2 (two) times daily.     No current facility-administered medications for this encounter.    Allergies:   Penicillins   Social History:  The patient  reports that he has never smoked. He has been exposed to tobacco smoke. He has never used smokeless tobacco. He reports current alcohol use. He reports that he does not use drugs.   Family History:  The patient's family history includes Cancer in his brother; Diabetes in his mother and another family member; Heart disease in his maternal aunt; Hypertension in his father; Other in his father; Stroke in his father.   ROS:  Please see the history of present illness.   All other systems are personally reviewed and negative.   Exam:  Wt 109.2 kg (240 lb 12.8 oz)   BMI 32.66 kg/m  General: NAD Neck: No JVD, no thyromegaly or thyroid nodule.  Lungs: Clear to auscultation bilaterally with normal respiratory effort. CV: Nondisplaced PMI.  Heart regular S1/S2, no S3/S4, no murmur.  No peripheral edema.  No carotid bruit.  Normal pedal pulses.  Abdomen: Soft, nontender, no hepatosplenomegaly, no distention.  Skin: Intact without lesions or rashes.  Neurologic: Alert and oriented x 3.  Psych: Normal affect. Extremities: No clubbing or cyanosis.  HEENT: Normal.   Recent Labs: 11/09/2022: Hemoglobin 14.5; Platelets 269 07/11/2023: B Natriuretic Peptide 40.1; BUN 15; Creatinine, Ser 0.90; Potassium 4.0; Sodium 136  Personally reviewed   Wt Readings from Last 3 Encounters:  07/11/23 109.2 kg (240 lb 12.8 oz)  03/16/23 107.5 kg (237 lb)  01/31/23 110.4 kg (243 lb 6 oz)     ASSESSMENT AND PLAN:  1. Paroxysmal atrial fibrillation:  Multiple atrial fibrillation risk factors including HTN, diabetes, and OSA.  No recent palpitations, he is in NSR today. Flecainide seems to be controlling atrial fibrillation better than sotalol.  ETT on flecainide in 3/19 looked ok.  - Continue flecainide.  - Continue Toprol XL 50 mg daily.   - Continue Eliquis, CBC today.   - If he develops breakthrough atrial fibrillation, ablation would be an option.    2. HTN: Controlled.  - Continue amlodipine and lisinopril. BMET today.   3. Hyperlipidemia:  - Continue atorvastatin.   - Check lipids today.  4. OSA: Continue to use CPAP.   5. Obesity: Continue to work on weight loss.   6. PVCs: No palpitations.  Zio monitor in 5/23 showed infrequent PVCs.   Followup in 6 months.   Signed, Marca Ancona, MD  07/11/2023  [facility] [address] [facility] [place]  [place] [address] [phone] (office) [phone] (fax)

## 2023-07-13 ENCOUNTER — Encounter (INDEPENDENT_AMBULATORY_CARE_PROVIDER_SITE_OTHER): Payer: Self-pay

## 2023-07-23 DIAGNOSIS — E119 Type 2 diabetes mellitus without complications: Secondary | ICD-10-CM | POA: Diagnosis not present

## 2023-07-23 DIAGNOSIS — E669 Obesity, unspecified: Secondary | ICD-10-CM | POA: Diagnosis not present

## 2023-07-23 DIAGNOSIS — Z8249 Family history of ischemic heart disease and other diseases of the circulatory system: Secondary | ICD-10-CM | POA: Diagnosis not present

## 2023-07-23 DIAGNOSIS — Z6831 Body mass index (BMI) 31.0-31.9, adult: Secondary | ICD-10-CM | POA: Diagnosis not present

## 2023-07-23 DIAGNOSIS — D6869 Other thrombophilia: Secondary | ICD-10-CM | POA: Diagnosis not present

## 2023-07-23 DIAGNOSIS — G4733 Obstructive sleep apnea (adult) (pediatric): Secondary | ICD-10-CM | POA: Diagnosis not present

## 2023-07-23 DIAGNOSIS — I10 Essential (primary) hypertension: Secondary | ICD-10-CM | POA: Diagnosis not present

## 2023-07-23 DIAGNOSIS — I4891 Unspecified atrial fibrillation: Secondary | ICD-10-CM | POA: Diagnosis not present

## 2023-07-23 DIAGNOSIS — Z7901 Long term (current) use of anticoagulants: Secondary | ICD-10-CM | POA: Diagnosis not present

## 2023-07-23 DIAGNOSIS — Z809 Family history of malignant neoplasm, unspecified: Secondary | ICD-10-CM | POA: Diagnosis not present

## 2023-07-23 DIAGNOSIS — E785 Hyperlipidemia, unspecified: Secondary | ICD-10-CM | POA: Diagnosis not present

## 2023-07-23 DIAGNOSIS — Z7984 Long term (current) use of oral hypoglycemic drugs: Secondary | ICD-10-CM | POA: Diagnosis not present

## 2023-08-08 ENCOUNTER — Ambulatory Visit (INDEPENDENT_AMBULATORY_CARE_PROVIDER_SITE_OTHER): Payer: Medicare HMO | Admitting: Family Medicine

## 2023-08-08 ENCOUNTER — Encounter: Payer: Self-pay | Admitting: Family Medicine

## 2023-08-08 ENCOUNTER — Other Ambulatory Visit: Payer: Self-pay | Admitting: Family Medicine

## 2023-08-08 VITALS — BP 121/80 | HR 58 | Temp 98.5°F | Ht 72.0 in | Wt 241.0 lb

## 2023-08-08 DIAGNOSIS — L570 Actinic keratosis: Secondary | ICD-10-CM

## 2023-08-08 DIAGNOSIS — Z7984 Long term (current) use of oral hypoglycemic drugs: Secondary | ICD-10-CM

## 2023-08-08 DIAGNOSIS — Z0001 Encounter for general adult medical examination with abnormal findings: Secondary | ICD-10-CM | POA: Diagnosis not present

## 2023-08-08 DIAGNOSIS — B353 Tinea pedis: Secondary | ICD-10-CM | POA: Diagnosis not present

## 2023-08-08 DIAGNOSIS — Z0000 Encounter for general adult medical examination without abnormal findings: Secondary | ICD-10-CM

## 2023-08-08 DIAGNOSIS — E1165 Type 2 diabetes mellitus with hyperglycemia: Secondary | ICD-10-CM | POA: Insufficient documentation

## 2023-08-08 LAB — COMPREHENSIVE METABOLIC PANEL
ALT: 34 U/L (ref 0–53)
AST: 19 U/L (ref 0–37)
Albumin: 4.3 g/dL (ref 3.5–5.2)
Alkaline Phosphatase: 93 U/L (ref 39–117)
BUN: 17 mg/dL (ref 6–23)
CO2: 30 meq/L (ref 19–32)
Calcium: 9.6 mg/dL (ref 8.4–10.5)
Chloride: 100 meq/L (ref 96–112)
Creatinine, Ser: 0.84 mg/dL (ref 0.40–1.50)
GFR: 90.62 mL/min (ref >60.00)
Glucose, Bld: 219 mg/dL — ABNORMAL HIGH (ref 70–99)
Potassium: 4 meq/L (ref 3.5–5.1)
Sodium: 139 meq/L (ref 135–145)
Total Bilirubin: 0.7 mg/dL (ref 0.2–1.2)
Total Protein: 7 g/dL (ref 6.0–8.3)

## 2023-08-08 LAB — CBC
HCT: 43.4 % (ref 39.0–52.0)
Hemoglobin: 14.1 g/dL (ref 13.0–17.0)
MCHC: 32.6 g/dL (ref 30.0–36.0)
MCV: 94.6 fl (ref 78.0–100.0)
Platelets: 248 10*3/uL (ref 150.0–400.0)
RBC: 4.59 Mil/uL (ref 4.22–5.81)
RDW: 13.8 % (ref 11.5–15.5)
WBC: 6.5 10*3/uL (ref 4.0–10.5)

## 2023-08-08 LAB — LIPID PANEL
Cholesterol: 108 mg/dL (ref 0–200)
HDL: 42.8 mg/dL (ref >39.00)
LDL Cholesterol: 41 mg/dL (ref 0–99)
NonHDL: 65.25
Total CHOL/HDL Ratio: 3
Triglycerides: 122 mg/dL (ref 0.0–149.0)
VLDL: 24.4 mg/dL (ref 0.0–40.0)

## 2023-08-08 LAB — HEMOGLOBIN A1C: Hgb A1c MFr Bld: 8.6 % — ABNORMAL HIGH (ref 4.6–6.5)

## 2023-08-08 LAB — MICROALBUMIN / CREATININE URINE RATIO
Creatinine,U: 164.9 mg/dL
Microalb Creat Ratio: 7 mg/g (ref 0.0–30.0)
Microalb, Ur: 11.6 mg/dL — ABNORMAL HIGH (ref 0.0–1.9)

## 2023-08-08 MED ORDER — ONETOUCH ULTRA TEST VI STRP: ORAL_STRIP | 3 refills | Status: DC

## 2023-08-08 MED ORDER — METFORMIN HCL ER 500 MG PO TB24: 1000.0000 mg | ORAL_TABLET | Freq: Two times a day (BID) | ORAL | Status: DC

## 2023-08-08 MED ORDER — KETOCONAZOLE 2 % EX CREA: 1.0000 | TOPICAL_CREAM | Freq: Every day | CUTANEOUS | 0 refills | Status: AC

## 2023-08-08 NOTE — Progress Notes (Signed)
Chief Complaint  Patient presents with   Annual Exam    Well Male George Carr is here for a complete physical.   His last physical was >1 year ago.  Current diet: in general, diet is fair.   Current exercise: walking Weight trend: stable Fatigue out of ordinary? No. Seat belt? Yes.   Advanced directive? Yes  Health maintenance Shingrix- Yes Colonoscopy- Yes Tetanus- Yes Hep C- Yes Pneumonia vaccine- Yes  Skin lesion Over the past 6 months, the patient has had a scaly and itchy spot on the left side of his face.  No new lotions, soaps, topicals, or detergents.  There is no pain, changing, drainage.  He has not tried anything at home so far.  He was outside a lot when he was younger and did not always wear sunscreen.  He does not follow with a dermatologist.  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)    Obesity, unspecified    Sleep apnea    Type II or unspecified type diabetes mellitus without mention of complication, not stated as uncontrolled      Past Surgical History:  Procedure Laterality Date   cornea radial keratotomy Bilateral 11/29/2003   EP IMPLANTABLE DEVICE N/A 09/09/2016   Procedure: Loop Recorder Insertion;  Surgeon: Hillis Range, MD;  Location: [facility];  Service: Cardiovascular;  Laterality: N/A;   implantable loop recorder removal  04/20/2022   MDT Reveal LINQ removed for battery at [facility]   KNEE ARTHROTOMY Right 11/28/1976   NASAL SEPTOPLASTY W/ TURBINOPLASTY  11/28/1997    Medications  Current Outpatient Medications on File Prior to Visit  Medication Sig Dispense Refill   amLODipine (NORVASC) 10 MG tablet TAKE 1 TABLET(10 MG) BY MOUTH DAILY 90 tablet 3   apixaban (ELIQUIS) 5 MG TABS tablet TAKE 1 TABLET(5 MG) BY MOUTH TWICE DAILY 180 tablet 3   atorvastatin (LIPITOR) 10 MG tablet TAKE 1/2 TABLET(5 MG) BY MOUTH  DAILY 45 tablet 3   chlorpheniramine-HYDROcodone (TUSSIONEX) 10-8 MG/5ML Take 5 mLs by mouth every 12 (twelve) hours as needed for cough. 115 mL 0   cyanocobalamin 1000 MCG tablet Take 1 tablet by mouth daily.     ferrous sulfate 325 (65 FE) MG tablet Take 1 tablet by mouth daily.     flecainide (TAMBOCOR) 50 MG tablet TAKE 1 TABLET(50 MG) BY MOUTH TWICE DAILY 180 tablet 3   fluticasone (FLONASE) 50 MCG/ACT nasal spray Place 2 sprays into both nostrils daily. 16 g 2   lisinopril (ZESTRIL) 40 MG tablet Take 1 tablet (40 mg total) by mouth daily. 90 tablet 3   loratadine (CLARITIN) 10 MG tablet Take 10 mg by mouth daily as needed for allergies.     metFORMIN (GLUCOPHAGE-XR) 500 MG 24 hr tablet Take 2 tablets (1,000 mg total) by mouth daily with breakfast. 180 tablet 2   metoprolol succinate (TOPROL-XL) 50 MG 24 hr tablet TAKE 1 TABLET(50 MG) BY MOUTH DAILY WITH OR IMMEDIATELY FOLLOWING A MEAL 90 tablet 3   ONE TOUCH ULTRA TEST test strip CHECK BLOOD SUGAR ONCE D  5   pantoprazole (PROTONIX) 40 MG tablet Take 40 mg by mouth 2 (two) times daily.      Allergies Allergies  Allergen Reactions   Penicillins Hives    Has patient had a PCN reaction causing immediate rash, facial/tongue/throat swelling, SOB or lightheadedness with hypotension:  Yes Has patient had a PCN reaction causing severe rash involving mucus membranes or skin necrosis: No Has patient had a PCN reaction that required hospitalization No Has patient had a PCN reaction occurring within the last 10 years: No If all of the above answers are "NO", then may proceed with Cephalosporin use.     Family History Family History  Problem Relation Age of Onset   Hypertension Father    Other Father        pat/mat uncles, malignant neoplasm of the prostate gland   Stroke Father    Diabetes Mother    Heart disease Maternal Aunt    Diabetes Other    Cancer Brother     Review of Systems: Constitutional:  no fevers Eye:  no recent  significant change in vision Ears:  No changes in hearing Nose/Mouth/Throat:  no complaints of nasal congestion, no sore throat Cardiovascular: no chest pain Respiratory:  No shortness of breath Gastrointestinal:  No change in bowel habits GU:  No frequency Integumentary: As noted in the HPI Neurologic:  no headaches Endocrine:  denies unexplained weight changes  Exam BP 121/80 (BP Location: Left Arm, Patient Position: Sitting, Cuff Size: Large)   Pulse (!) 58   Temp 98.5 F (36.9 C) (Oral)   Ht 6' (1.829 m)   Wt 241 lb (109.3 kg)   SpO2 97%   BMI 32.69 kg/m  General:  well developed, well nourished, in no apparent distress Skin: Scaly patches x 2 over the left zygomatic region with a pink base; macerated tissue between 2/3, 3/4, 4/5 digits on the feet bilaterally; otherwise no significant moles, warts, or growths on exposed skin Head:  no masses, lesions, or tenderness Eyes:  pupils equal and round, sclera anicteric without injection Ears:  canals without lesions, TMs shiny without retraction, no obvious effusion, no erythema Nose:  nares patent, mucosa normal Throat/Pharynx:  lips and gingiva without lesion; tongue and uvula midline; non-inflamed pharynx; no exudates or postnasal drainage Lungs:  clear to auscultation, breath sounds equal bilaterally, no respiratory distress Cardio: Bradycardic, regular rhythm, no LE edema or bruits Rectal: Deferred GI: BS+, S, NT, ND, no masses or organomegaly Musculoskeletal:  symmetrical muscle groups noted without atrophy or deformity Neuro: Sensation intact to pinprick bilaterally, gait normal; deep tendon reflexes normal and symmetric Psych: well oriented with normal range of affect and appropriate judgment/insight  Procedure note: cryotherapy Verbal consent obtained 1 large area/lesion treated Liquid nitrogen was applied via a thin spray creating an ice ball with 1-2 mm corona surrounding the lesion The patient tolerated the procedure  well There were no immediate complications noted  Assessment and Plan  Well adult exam  Type 2 diabetes mellitus with hyperglycemia, without long-term current use of insulin (HCC) - Plan: CBC, Comprehensive metabolic panel, Microalbumin / creatinine urine ratio, Lipid panel, Hemoglobin A1c  Tinea pedis of both feet - Plan: ketoconazole (NIZORAL) 2 % cream  Actinic keratoses - Plan: Ambulatory referral to Dermatology, PR DESTRUCTION PREMALIGNANT LESION 1ST   Well 67 y.o. male. Counseled on diet and exercise. DM: Home sugars have not been good. Will increase metformin to 1000 mg bid from 1000 mg/d. In 2025, will be able to get injectables again.  Advanced directive form requested today. Actinic keratoses: 2 on face, cryo today. Discussed possible extra charge with this. He was OK to proceed. Refer derm for general skin checks. Discussed what to expect over next several days.   Other orders as above. Follow up in 6  mo.  The patient voiced understanding and agreement to the plan.  Jilda Roche [place], DO 08/08/23 8:09 AM

## 2023-08-08 NOTE — Patient Instructions (Addendum)
Give [name] 2-3 business days to get the results of your labs back.   Keep the diet clean and stay active.  Please consider adding some weight resistance exercise to your routine. Consider yoga as well.   I recommend getting the flu shot in mid October. This suggestion would change if the CDC comes out with a different recommendation.   Please get me a copy of your advanced directive form at your convenience.   Let us know if you need anything.

## 2023-08-21 DIAGNOSIS — D2261 Melanocytic nevi of right upper limb, including shoulder: Secondary | ICD-10-CM | POA: Diagnosis not present

## 2023-08-21 DIAGNOSIS — D2372 Other benign neoplasm of skin of left lower limb, including hip: Secondary | ICD-10-CM | POA: Diagnosis not present

## 2023-08-21 DIAGNOSIS — D2262 Melanocytic nevi of left upper limb, including shoulder: Secondary | ICD-10-CM | POA: Diagnosis not present

## 2023-08-21 DIAGNOSIS — L570 Actinic keratosis: Secondary | ICD-10-CM | POA: Diagnosis not present

## 2023-08-21 DIAGNOSIS — L821 Other seborrheic keratosis: Secondary | ICD-10-CM | POA: Diagnosis not present

## 2023-09-18 ENCOUNTER — Telehealth: Payer: Self-pay | Admitting: Family Medicine

## 2023-09-18 MED ORDER — METFORMIN HCL ER 500 MG PO TB24
1000.0000 mg | ORAL_TABLET | Freq: Two times a day (BID) | ORAL | 3 refills | Status: DC
  Filled 2023-12-14: qty 360, 90d supply, fill #0

## 2023-09-18 NOTE — Telephone Encounter (Signed)
Prescription Request  09/18/2023  Is this a "Controlled Substance" medicine? No  LOV: 08/08/2023  What is the name of the medication or equipment?   metFORMIN (GLUCOPHAGE-XR) 500 MG 24 hr tablet [[id number]]  Have you contacted your pharmacy to request a refill? No   Which pharmacy would you like this sent to?   [facility] - [place], [place] - [address] [facility] AT Portal to Registered [facility] [address] [place] [address] Phone: [phone] Fax: [phone]    Patient notified that their request is being sent to the clinical staff for review and that they should receive a response within 2 business days.   Please advise at Mobile [phone] (mobile)

## 2023-09-18 NOTE — Addendum Note (Signed)
Addended by: Scharlene Gloss B on: 09/18/2023 09:29 AM   Modules accepted: Orders

## 2023-09-18 NOTE — Telephone Encounter (Signed)
Sent as requested Patient called and informed sent in.

## 2023-11-14 ENCOUNTER — Encounter: Payer: Self-pay | Admitting: Family Medicine

## 2023-11-14 ENCOUNTER — Ambulatory Visit (INDEPENDENT_AMBULATORY_CARE_PROVIDER_SITE_OTHER): Payer: Medicare HMO | Admitting: Family Medicine

## 2023-11-14 VITALS — BP 122/76 | HR 56 | Temp 98.0°F | Resp 16 | Ht 74.0 in | Wt 241.6 lb

## 2023-11-14 DIAGNOSIS — Z7984 Long term (current) use of oral hypoglycemic drugs: Secondary | ICD-10-CM | POA: Diagnosis not present

## 2023-11-14 DIAGNOSIS — Z7985 Long-term (current) use of injectable non-insulin antidiabetic drugs: Secondary | ICD-10-CM | POA: Diagnosis not present

## 2023-11-14 DIAGNOSIS — E1165 Type 2 diabetes mellitus with hyperglycemia: Secondary | ICD-10-CM

## 2023-11-14 LAB — HEMOGLOBIN A1C: Hgb A1c MFr Bld: 7.7 % — ABNORMAL HIGH (ref 4.6–6.5)

## 2023-11-14 NOTE — Progress Notes (Signed)
Subjective:   Chief Complaint  Patient presents with   Follow-up    Follow up    George Carr is a 67 y.o. male here for follow-up of diabetes.   George Carr's self monitored glucose range is mid 100's.  Patient denies hypoglycemic reactions. He checks his glucose levels 2 time(s) per day. Patient does not require insulin.   Medications include: metformin XR 1000 mg bid Diet is OK- improving.  Exercise: walking, wt lifting No CP or SOB.  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)    Obesity, unspecified    Sleep apnea    Type II or unspecified type diabetes mellitus without mention of complication, not stated as uncontrolled      Related testing: Retinal exam: Done Pneumovax: done  Objective:  BP 122/76   Pulse (!) 56   Temp 98 F (36.7 C) (Oral)   Resp 16   Ht 6\' 2"  (1.88 m)   Wt 241 lb 9.6 oz (109.6 kg)   SpO2 95%   BMI 31.02 kg/m  General:  Well developed, well nourished, in no apparent distress Lungs:  CTAB, no access msc use Cardio:  RRR, no bruits, no LE edema Psych: Age appropriate judgment and insight  Assessment:   Type 2 diabetes mellitus with hyperglycemia, without long-term current use of insulin (HCC) - Plan: Hemoglobin A1c   Plan:   Chronic, uncontrolled. Expect A1c to be high 7's. Will cont metformin XR 1000 mg bid. He has new ins starting on 11/30/23. He will message me at that time and we will send in Mounjaro 2.5 mg/week and titrate to highest tolerable dosage. Counseled on diet and exercise. F/u in 3 mo. The patient voiced understanding and agreement to the plan.  George Roche [place], DO 11/14/23 8:02 AM

## 2023-11-14 NOTE — Patient Instructions (Addendum)
Give [name] 2-3 business days to get the results of your labs back.   Keep the diet clean and stay active.  Let me know when 2025 rolls around and you want me to send in the weekly injectable to the pharmacy.   Let us know if you need anything.

## 2023-11-30 ENCOUNTER — Encounter: Payer: Self-pay | Admitting: Family Medicine

## 2023-11-30 ENCOUNTER — Other Ambulatory Visit (HOSPITAL_COMMUNITY): Payer: Self-pay

## 2023-11-30 ENCOUNTER — Other Ambulatory Visit: Payer: Self-pay | Admitting: Family Medicine

## 2023-11-30 MED ORDER — TIRZEPATIDE 5 MG/0.5ML ~~LOC~~ SOAJ
5.0000 mg | SUBCUTANEOUS | 0 refills | Status: DC
  Filled 2023-11-30 – 2024-01-22 (×2): qty 2, 28d supply, fill #0

## 2023-11-30 MED ORDER — TIRZEPATIDE 15 MG/0.5ML ~~LOC~~ SOAJ
15.0000 mg | SUBCUTANEOUS | 2 refills | Status: AC
  Filled 2023-11-30 – 2024-06-09 (×2): qty 6, 84d supply, fill #0
  Filled 2024-08-30: qty 6, 84d supply, fill #1
  Filled 2024-11-17: qty 6, 84d supply, fill #2

## 2023-11-30 MED ORDER — TIRZEPATIDE 12.5 MG/0.5ML ~~LOC~~ SOAJ
12.5000 mg | SUBCUTANEOUS | 0 refills | Status: DC
  Filled 2023-11-30 – 2024-04-14 (×2): qty 2, 28d supply, fill #0

## 2023-11-30 MED ORDER — TIRZEPATIDE 10 MG/0.5ML ~~LOC~~ SOAJ
10.0000 mg | SUBCUTANEOUS | 0 refills | Status: AC
  Filled 2023-11-30 – 2024-03-17 (×2): qty 2, 28d supply, fill #0

## 2023-11-30 MED ORDER — TIRZEPATIDE 7.5 MG/0.5ML ~~LOC~~ SOAJ
7.5000 mg | SUBCUTANEOUS | 0 refills | Status: DC
  Filled 2023-11-30: qty 2, 28d supply, fill #0

## 2023-11-30 MED ORDER — TIRZEPATIDE 2.5 MG/0.5ML ~~LOC~~ SOAJ
2.5000 mg | SUBCUTANEOUS | 0 refills | Status: AC
  Filled 2023-11-30 – 2023-12-01 (×2): qty 2, 28d supply, fill #0

## 2023-12-01 ENCOUNTER — Other Ambulatory Visit (HOSPITAL_COMMUNITY): Payer: Self-pay

## 2023-12-12 ENCOUNTER — Other Ambulatory Visit (HOSPITAL_COMMUNITY): Payer: Self-pay

## 2023-12-13 ENCOUNTER — Other Ambulatory Visit (HOSPITAL_COMMUNITY): Payer: Self-pay

## 2023-12-14 ENCOUNTER — Telehealth: Payer: Self-pay

## 2023-12-14 ENCOUNTER — Other Ambulatory Visit (HOSPITAL_COMMUNITY): Payer: Self-pay

## 2023-12-14 MED ORDER — MOUNJARO 5 MG/0.5ML ~~LOC~~ SOAJ
5.0000 mg | SUBCUTANEOUS | 0 refills | Status: DC
  Filled 2023-12-20: qty 2, 28d supply, fill #0

## 2023-12-14 MED ORDER — PANTOPRAZOLE SODIUM 40 MG PO TBEC
40.0000 mg | DELAYED_RELEASE_TABLET | Freq: Two times a day (BID) | ORAL | 3 refills | Status: DC
  Filled 2023-12-14: qty 180, 90d supply, fill #0
  Filled 2024-03-12: qty 180, 90d supply, fill #1

## 2023-12-14 NOTE — Telephone Encounter (Signed)
PA approved. Effective 12/14/23 to 12/13/24

## 2023-12-14 NOTE — Telephone Encounter (Signed)
Tried initiating PA via covermymeds- informed that there is already a PA in process. Called [facility] at [phone] to see if they have received OV notes and A1c.   Spoke w/ Rissish- information for A1c and diagnosis given. Determination will be faxed in 2-3 business days

## 2023-12-14 NOTE — Telephone Encounter (Signed)
Copied from CRM [id number]. Topic: Clinical - Prescription Issue >> Dec 14, 2023  1:30 PM Isabell A wrote: Reason for CRM: Lokesh from [employer] prior authorization team needs additional information like the patients diagnosis & A1C levels for Mounjaro.  Callback number: [phone]

## 2023-12-14 NOTE — Telephone Encounter (Signed)
Will need updated ins card, message sent to Pt asking for copy of front and back of card.

## 2023-12-15 ENCOUNTER — Other Ambulatory Visit (HOSPITAL_COMMUNITY): Payer: Self-pay

## 2023-12-15 NOTE — Telephone Encounter (Signed)
Message sent to pt.

## 2023-12-18 MED ORDER — ONETOUCH ULTRA TEST VI STRP: ORAL_STRIP | 3 refills | Status: DC

## 2023-12-18 NOTE — Addendum Note (Signed)
Addended by: Conrad Crossnore D on: 12/18/2023 08:05 AM   Modules accepted: Orders

## 2023-12-18 NOTE — Addendum Note (Signed)
Addended by: Conrad Arroyo [place]ondo D on: 12/18/2023 08:06 AM   Modules accepted: Orders

## 2023-12-20 ENCOUNTER — Other Ambulatory Visit (HOSPITAL_COMMUNITY): Payer: Self-pay

## 2024-01-03 ENCOUNTER — Other Ambulatory Visit (HOSPITAL_COMMUNITY): Payer: Self-pay

## 2024-01-03 DIAGNOSIS — I480 Paroxysmal atrial fibrillation: Secondary | ICD-10-CM

## 2024-01-03 MED ORDER — ATORVASTATIN CALCIUM 10 MG PO TABS: ORAL_TABLET | ORAL | 3 refills | Status: DC

## 2024-01-03 MED ORDER — LISINOPRIL 40 MG PO TABS: 40.0000 mg | ORAL_TABLET | Freq: Every day | ORAL | 3 refills | Status: DC

## 2024-01-03 MED ORDER — AMLODIPINE BESYLATE 10 MG PO TABS: ORAL_TABLET | ORAL | 3 refills | Status: DC

## 2024-01-03 MED ORDER — METOPROLOL SUCCINATE ER 50 MG PO TB24: ORAL_TABLET | ORAL | 3 refills | Status: DC

## 2024-01-03 MED ORDER — FLECAINIDE ACETATE 50 MG PO TABS: ORAL_TABLET | ORAL | 3 refills | Status: DC

## 2024-01-21 ENCOUNTER — Encounter (HOSPITAL_COMMUNITY): Payer: Self-pay

## 2024-01-22 ENCOUNTER — Other Ambulatory Visit (HOSPITAL_COMMUNITY): Payer: Self-pay

## 2024-01-23 ENCOUNTER — Encounter (HOSPITAL_COMMUNITY): Payer: Self-pay

## 2024-01-23 DIAGNOSIS — I480 Paroxysmal atrial fibrillation: Secondary | ICD-10-CM

## 2024-01-24 ENCOUNTER — Other Ambulatory Visit: Payer: Self-pay

## 2024-01-24 ENCOUNTER — Other Ambulatory Visit (HOSPITAL_COMMUNITY): Payer: Self-pay

## 2024-01-24 MED ORDER — AMLODIPINE BESYLATE 10 MG PO TABS
10.0000 mg | ORAL_TABLET | Freq: Every day | ORAL | 3 refills | Status: AC
  Filled 2024-01-24: qty 90, 90d supply, fill #0
  Filled 2024-04-28: qty 90, 90d supply, fill #1
  Filled 2024-07-28: qty 90, 90d supply, fill #2
  Filled 2024-10-27: qty 90, 90d supply, fill #3

## 2024-01-24 MED ORDER — ATORVASTATIN CALCIUM 10 MG PO TABS
5.0000 mg | ORAL_TABLET | Freq: Every day | ORAL | 3 refills | Status: DC
  Filled 2024-01-24: qty 45, 90d supply, fill #0
  Filled 2024-04-28: qty 45, 90d supply, fill #1
  Filled 2024-07-28: qty 45, 90d supply, fill #2

## 2024-01-24 MED ORDER — FLECAINIDE ACETATE 50 MG PO TABS
50.0000 mg | ORAL_TABLET | Freq: Two times a day (BID) | ORAL | 3 refills | Status: AC
  Filled 2024-01-24: qty 180, 90d supply, fill #0
  Filled 2024-04-28: qty 180, 90d supply, fill #1
  Filled 2024-07-28: qty 180, 90d supply, fill #2
  Filled 2024-10-27: qty 180, 90d supply, fill #3

## 2024-01-24 MED ORDER — METOPROLOL SUCCINATE ER 50 MG PO TB24
50.0000 mg | ORAL_TABLET | Freq: Every day | ORAL | 3 refills | Status: AC
  Filled 2024-01-24: qty 90, 90d supply, fill #0
  Filled 2024-04-28: qty 90, 90d supply, fill #1
  Filled 2024-07-28: qty 90, 90d supply, fill #2
  Filled 2024-10-27: qty 90, 90d supply, fill #3

## 2024-01-24 MED ORDER — LISINOPRIL 40 MG PO TABS
40.0000 mg | ORAL_TABLET | Freq: Every day | ORAL | 3 refills | Status: AC
  Filled 2024-01-24: qty 90, 90d supply, fill #0
  Filled 2024-04-28: qty 90, 90d supply, fill #1
  Filled 2024-07-28: qty 90, 90d supply, fill #2
  Filled 2024-10-27: qty 90, 90d supply, fill #3

## 2024-02-14 ENCOUNTER — Encounter: Payer: Self-pay | Admitting: Family Medicine

## 2024-02-14 ENCOUNTER — Ambulatory Visit (INDEPENDENT_AMBULATORY_CARE_PROVIDER_SITE_OTHER): Payer: Self-pay | Admitting: Family Medicine

## 2024-02-14 ENCOUNTER — Other Ambulatory Visit (HOSPITAL_COMMUNITY): Payer: Self-pay

## 2024-02-14 VITALS — BP 132/72 | HR 74 | Temp 98.2°F | Resp 18 | Ht 74.0 in | Wt 236.0 lb

## 2024-02-14 DIAGNOSIS — Z7985 Long-term (current) use of injectable non-insulin antidiabetic drugs: Secondary | ICD-10-CM | POA: Diagnosis not present

## 2024-02-14 DIAGNOSIS — E7800 Pure hypercholesterolemia, unspecified: Secondary | ICD-10-CM

## 2024-02-14 DIAGNOSIS — E1165 Type 2 diabetes mellitus with hyperglycemia: Secondary | ICD-10-CM | POA: Diagnosis not present

## 2024-02-14 LAB — COMPREHENSIVE METABOLIC PANEL
ALT: 28 U/L (ref 0–53)
AST: 16 U/L (ref 0–37)
Albumin: 4.5 g/dL (ref 3.5–5.2)
Alkaline Phosphatase: 67 U/L (ref 39–117)
BUN: 15 mg/dL (ref 6–23)
CO2: 31 meq/L (ref 19–32)
Calcium: 9.8 mg/dL (ref 8.4–10.5)
Chloride: 101 meq/L (ref 96–112)
Creatinine, Ser: 0.84 mg/dL (ref 0.40–1.50)
GFR: 90.29 mL/min (ref >60.00)
Glucose, Bld: 117 mg/dL — ABNORMAL HIGH (ref 70–99)
Potassium: 3.8 meq/L (ref 3.5–5.1)
Sodium: 140 meq/L (ref 135–145)
Total Bilirubin: 0.7 mg/dL (ref 0.2–1.2)
Total Protein: 6.8 g/dL (ref 6.0–8.3)

## 2024-02-14 LAB — LIPID PANEL
Cholesterol: 97 mg/dL (ref 0–200)
HDL: 37.7 mg/dL — ABNORMAL LOW (ref >39.00)
LDL Cholesterol: 41 mg/dL (ref 0–99)
NonHDL: 59.31
Total CHOL/HDL Ratio: 3
Triglycerides: 90 mg/dL (ref 0.0–149.0)
VLDL: 18 mg/dL (ref 0.0–40.0)

## 2024-02-14 LAB — HEMOGLOBIN A1C: Hgb A1c MFr Bld: 6.5 % (ref 4.6–6.5)

## 2024-02-14 MED ORDER — TIRZEPATIDE 7.5 MG/0.5ML ~~LOC~~ SOAJ
7.5000 mg | SUBCUTANEOUS | 0 refills | Status: AC
  Filled 2024-02-14: qty 2, 28d supply, fill #0

## 2024-02-14 NOTE — Patient Instructions (Signed)
 Give [name] 2-3 business days to get the results of your labs back.   Keep the diet clean and stay active.  Please consider adding some weight resistance exercise to your routine. Consider yoga as well.   Let us know if you need anything.

## 2024-02-14 NOTE — Progress Notes (Signed)
 Subjective:   Chief Complaint  Patient presents with   Follow-up    George Carr is a 68 y.o. male here for follow-up of diabetes.   George Carr's self monitored glucose range is low-mid 100's.  Patient denies hypoglycemic reactions. He checks his glucose levels 2 time(s) per day. Patient does not require insulin.   Medications include: Mounjaro 5 mg/week Diet is healthy.  Exercise:   Hyperlipidemia Patient presents for dyslipidemia follow up. Currently being treated with Lipitor 10 mg/d and compliance with treatment thus far has been good. He denies myalgias. Diet/exercise as above.  The patient is not known to have coexisting coronary artery disease.  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)    Obesity, unspecified    Sleep apnea    Type II or unspecified type diabetes mellitus without mention of complication, not stated as uncontrolled      Related testing: Retinal exam: Done Pneumovax: done  Objective:  BP 132/72   Pulse 74   Temp 98.2 F (36.8 C)   Resp 18   Ht 6\' 2"  (1.88 m)   Wt 236 lb (107 kg)   SpO2 100%   BMI 30.30 kg/m  General:  Well developed, well nourished, in no apparent distress Lungs:  CTAB, no access msc use Cardio:  RRR, no bruits, 1+ pitting b/l LE edema tapering at prox 1/3 of tibia Psych: Age appropriate judgment and insight  Assessment:   Type 2 diabetes mellitus with hyperglycemia, without long-term current use of insulin (HCC) - Plan: Comprehensive metabolic panel, Lipid panel, Hemoglobin A1c  Pure hypercholesterolemia   Plan:   Chronic, hopefully stable. Cont dose escalation of the Mounjaro, soon to start the 7.5 mg/week dosage. Counseled on diet and exercise. Chronic, stable. Cont Lipitor 10 mg/d.  F/u in 6 mo. The patient voiced understanding and agreement to the plan.  Jilda Roche [place],  DO 02/14/24 7:35 AM

## 2024-02-15 ENCOUNTER — Encounter (HOSPITAL_COMMUNITY): Payer: Self-pay | Admitting: Cardiology

## 2024-02-21 ENCOUNTER — Ambulatory Visit (HOSPITAL_COMMUNITY)
Admission: RE | Admit: 2024-02-21 | Discharge: 2024-02-21 | Disposition: A | Discharge status: Home / Self Care | Payer: Self-pay | Source: Ambulatory Visit | Attending: Cardiology | Admitting: Cardiology

## 2024-02-21 ENCOUNTER — Encounter (HOSPITAL_COMMUNITY): Payer: Self-pay | Admitting: Cardiology

## 2024-02-21 VITALS — BP 122/80 | HR 72 | Wt 236.4 lb

## 2024-02-21 DIAGNOSIS — E785 Hyperlipidemia, unspecified: Secondary | ICD-10-CM | POA: Insufficient documentation

## 2024-02-21 DIAGNOSIS — Z7901 Long term (current) use of anticoagulants: Secondary | ICD-10-CM | POA: Insufficient documentation

## 2024-02-21 DIAGNOSIS — G4733 Obstructive sleep apnea (adult) (pediatric): Secondary | ICD-10-CM | POA: Insufficient documentation

## 2024-02-21 DIAGNOSIS — E669 Obesity, unspecified: Secondary | ICD-10-CM | POA: Insufficient documentation

## 2024-02-21 DIAGNOSIS — Z7985 Long-term (current) use of injectable non-insulin antidiabetic drugs: Secondary | ICD-10-CM | POA: Diagnosis not present

## 2024-02-21 DIAGNOSIS — I10 Essential (primary) hypertension: Secondary | ICD-10-CM | POA: Diagnosis not present

## 2024-02-21 DIAGNOSIS — Z8249 Family history of ischemic heart disease and other diseases of the circulatory system: Secondary | ICD-10-CM | POA: Insufficient documentation

## 2024-02-21 DIAGNOSIS — I493 Ventricular premature depolarization: Secondary | ICD-10-CM | POA: Insufficient documentation

## 2024-02-21 DIAGNOSIS — Z6830 Body mass index (BMI) 30.0-30.9, adult: Secondary | ICD-10-CM | POA: Insufficient documentation

## 2024-02-21 DIAGNOSIS — I480 Paroxysmal atrial fibrillation: Secondary | ICD-10-CM | POA: Diagnosis not present

## 2024-02-21 DIAGNOSIS — E119 Type 2 diabetes mellitus without complications: Secondary | ICD-10-CM | POA: Diagnosis not present

## 2024-02-21 DIAGNOSIS — Z833 Family history of diabetes mellitus: Secondary | ICD-10-CM | POA: Diagnosis not present

## 2024-02-21 DIAGNOSIS — Z8616 Personal history of COVID-19: Secondary | ICD-10-CM | POA: Diagnosis not present

## 2024-02-21 DIAGNOSIS — Z79899 Other long term (current) drug therapy: Secondary | ICD-10-CM | POA: Insufficient documentation

## 2024-02-21 NOTE — Patient Instructions (Signed)
 There has been no changes to your medications.  Your physician recommends that you schedule a follow-up appointment in: 6 months ( September) ** PLEASE CALL THE OFFICE IN [place] TO ARRANGE YOUR FOLLOW UP APPOINTMENT.**  If you have any questions or concerns before your next appointment please send [name] a message through [place] or call our office at [phone].    TO LEAVE A MESSAGE FOR THE NURSE SELECT OPTION 2, PLEASE LEAVE A MESSAGE INCLUDING: YOUR NAME DATE OF BIRTH CALL BACK NUMBER REASON FOR CALL**this is important as we prioritize the call backs  YOU WILL RECEIVE A CALL BACK THE SAME DAY AS LONG AS YOU CALL BEFORE 4:00 PM  At the [facility], you and your health needs are our priority. As part of our continuing mission to provide you with exceptional heart care, we have created designated Provider Care Teams. These Care Teams include your primary Cardiologist (physician) and Advanced Practice Providers (APPs- Physician Assistants and Nurse Practitioners) who all work together to provide you with the care you need, when you need it.   You may see any of the following providers on your designated Care Team at your next follow up: Dr Arvilla Meres Dr Marca Ancona Dr. Dorthula Nettles Dr. Clearnce Hasten Amy Filbert Schilder, NP Robbie Lis, [place] [facility] [place], [place] Brynda Peon, NP [place] Lee, NP Clarisa Kindred, NP Karle Plumber, PharmD Enos Fling, PharmD   Please be sure to bring in all your medications bottles to every appointment.    Thank you for choosing [facility]

## 2024-02-22 NOTE — Progress Notes (Signed)
 ID:  George Carr, DOB 11/08/1956, MRN [id number]   Provider location: [place] [facility] Type of Visit: Established patient   PCP:  Sharlene Dory, DO  Cardiologist: Dr. Shirlee Latch  Chief complaint: Atrial fibrillation followup   History of Present Illness: George Carr is a 68 y.o. male who has a history of type II diabetes, HTN, and atrial fibrillation.  Atrial fibrillation was first noted in 2/15 when he went for a colonoscopy.  He was unaware of it, no symptoms.  He was started on sotalol and Xarelto, later switched to Eliquis.  He went back into NSR.  He denies exertional dyspnea or chest pain.  He has good exercise tolerance.  He tries to walk for exercise as much as he can.  He has OSA and is using CPAP.  Echo was done in 3/15, showing preserved LV systolic function.  Lexiscan Cardiolite was done in 3/15 showing no ischemia.    Patient was doing well until 07/25/14.  On that day, he had a "weak spell" while walking through his kitchen.  He did not pass out completely but felt weak and lightheaded and slumped down to the floor.  He did not feel chest pain or palpitations.  He felt back to normal after about 30 seconds.  He has not had any further episodes like this.  He was seen in this office in on 8/28 after the "spell."  He was noted to be in atrial fibrillation with rate control.  He did not feel the atrial fibrillation.  Presyncopal episode was thought to be possible related to postural hypotension.  HCTZ was stopped and he has had no further dizziness.     Due to breakthrough atrial fibrillation, he was transitioned from sotalol to flecainide.    In 12/20, he had COVID-19 PNA, was hospitalized for about a week.   He returns for followup of atrial fibrillation.  He is in NSR today.  Weight down 4 lbs.  No atrial fibrillation noted on his Apple Watch and no palpitations.  No chest pain or exertional dyspnea. Getting more exercise recently.  Working  full time.  No BRBPR/melena.     Labs (10/19): K 3.9, creatinine 0.72, hgb 13.5 Labs (1/20): K 4.3, creatinine 0.81 Labs (5/20): LDL 58, HDL 51 Labs (8/22): LDL 58, hgb 13.6, K 4.1, creatinine 0.86 Labs (2/23): K 4.2, creatinine 0.92 Labs (8/23): K 3.9, creatinine 0.87, hgb 13.7, LDL 63 Labs (2/24): hgb 14.3 Labs (3/25): LDL 41, K 3.8, creatinine 4.09  ECG (personally reviewed): NSR, RBBB, LAFB  PMH: 1. Atrial fibrillation: Paroxysmal.  - ETT 3/19 was normal.  - Echo (5/18): EF 60%, RV normal.  2. HTN 3. Type 2 diabetes 4. Hyperlipidemia 5. COVID-19 PNA in 12/20.  6. OSA: Uses CPAP.  7. GERD with esophagitis.  8. PVCs: 7 day Zio in 5/23 with occasional PVCs, no AF.   Current Outpatient Medications  Medication Sig Dispense Refill   amLODipine (NORVASC) 10 MG tablet Take 1 tablet (10 mg total) by mouth daily. 90 tablet 3   apixaban (ELIQUIS) 5 MG TABS tablet Take 1 tablet (5 mg total) by mouth 2 (two) times daily. 180 tablet 3   atorvastatin (LIPITOR) 10 MG tablet Take 0.5 tablets (5 mg total) by mouth daily. 45 tablet 3   cyanocobalamin 1000 MCG tablet Take 1 tablet by mouth daily.     ferrous sulfate 325 (65 FE) MG tablet Take 1 tablet by mouth daily.  flecainide (TAMBOCOR) 50 MG tablet Take 1 tablet (50 mg total) by mouth 2 (two) times daily. 180 tablet 3   fluticasone (FLONASE) 50 MCG/ACT nasal spray Place 2 sprays into both nostrils as needed for allergies or rhinitis.     glucose blood (ONETOUCH ULTRA TEST) test strip Use twice daily to check blood sugar.   DX E11.69 200 each 3   lisinopril (ZESTRIL) 40 MG tablet Take 1 tablet (40 mg total) by mouth daily. 90 tablet 3   loratadine (CLARITIN) 10 MG tablet Take 10 mg by mouth daily as needed for allergies.     metoprolol succinate (TOPROL-XL) 50 MG 24 hr tablet Take 1 tablet (50 mg total) by mouth daily with or immediately following a meal. 90 tablet 3   pantoprazole (PROTONIX) 40 MG tablet Take 1 tablet (40 mg total) by  mouth 2 (two) times daily in the morning and evening before meals 180 tablet 3   tirzepatide (MOUNJARO) 7.5 MG/0.5ML Pen Inject 7.5 mg into the skin once a week for 28 days. 2 mL 0   tirzepatide (MOUNJARO) 10 MG/0.5ML Pen Inject 10 mg into the skin once a week for 28 days. (Patient not taking: Reported on 02/21/2024) 2 mL 0   [START ON 03/21/2024] tirzepatide (MOUNJARO) 12.5 MG/0.5ML Pen Inject 12.5 mg into the skin once a week for 28 days. (Patient not taking: Reported on 02/21/2024) 2 mL 0   [START ON 04/18/2024] tirzepatide (MOUNJARO) 15 MG/0.5ML Pen Inject 15 mg into the skin once a week. (Patient not taking: Reported on 02/21/2024) 6 mL 2   No current facility-administered medications for this encounter.    Allergies:   Penicillins   Social History:  The patient  reports that he has never smoked. He has been exposed to tobacco smoke. He has never used smokeless tobacco. He reports current alcohol use. He reports that he does not use drugs.   Family History:  The patient's family history includes Cancer in his brother; Diabetes in his mother and another family member; Heart disease in his maternal aunt; Hypertension in his father; Other in his father; Stroke in his father.   ROS:  Please see the history of present illness.   All other systems are personally reviewed and negative.   Exam:  BP 122/80   Pulse 72   Wt 107.2 kg (236 lb 6.4 oz)   SpO2 98%   BMI 30.35 kg/m  General: NAD Neck: No JVD, no thyromegaly or thyroid nodule.  Lungs: Clear to auscultation bilaterally with normal respiratory effort. CV: Nondisplaced PMI.  Heart regular S1/S2, no S3/S4, no murmur.  No peripheral edema.  No carotid bruit.  Normal pedal pulses.  Abdomen: Soft, nontender, no hepatosplenomegaly, no distention.  Skin: Intact without lesions or rashes.  Neurologic: Alert and oriented x 3.  Psych: Normal affect. Extremities: No clubbing or cyanosis.  HEENT: Normal.   Recent Labs: 07/11/2023: B Natriuretic  Peptide 40.1 08/08/2023: Hemoglobin 14.1; Platelets 248.0 02/14/2024: ALT 28; BUN 15; Creatinine, Ser 0.84; Potassium 3.8; Sodium 140  Personally reviewed   Wt Readings from Last 3 Encounters:  02/21/24 107.2 kg (236 lb 6.4 oz)  02/14/24 107 kg (236 lb)  11/14/23 109.6 kg (241 lb 9.6 oz)     ASSESSMENT AND PLAN:  1. Paroxysmal atrial fibrillation:  Multiple atrial fibrillation risk factors including HTN, diabetes, and OSA.  No recent palpitations, he is in NSR today. Flecainide seems to be controlling atrial fibrillation better than sotalol.  ETT on flecainide in  3/19 looked ok.  - Continue flecainide.  - Continue Toprol XL 50 mg daily.  - Continue Eliquis - If he develops breakthrough atrial fibrillation, ablation would be an option.    2. HTN: Controlled.  - Continue amlodipine and lisinopril. Stable BMET recently.  3. Hyperlipidemia:  - Continue atorvastatin.   - Good lipids in 3/25.  4. OSA: Continue to use CPAP.   5. Obesity: Continue to work on weight loss.   6. PVCs: No palpitations.  Zio monitor in 5/23 showed infrequent PVCs.   Followup in 6 months.   I spent 21 minutes reviewing records, interviewing/examining patient, and managing orders.   Signed, Marca Ancona, MD  02/22/2024  [facility] [address] [facility] [place] [address] [phone] (office) [phone] (fax)

## 2024-03-12 ENCOUNTER — Other Ambulatory Visit (HOSPITAL_COMMUNITY): Payer: Self-pay

## 2024-03-18 ENCOUNTER — Other Ambulatory Visit (HOSPITAL_COMMUNITY): Payer: Self-pay

## 2024-03-26 DIAGNOSIS — H524 Presbyopia: Secondary | ICD-10-CM | POA: Diagnosis not present

## 2024-03-26 DIAGNOSIS — E113291 Type 2 diabetes mellitus with mild nonproliferative diabetic retinopathy without macular edema, right eye: Secondary | ICD-10-CM | POA: Diagnosis not present

## 2024-03-26 DIAGNOSIS — H2513 Age-related nuclear cataract, bilateral: Secondary | ICD-10-CM | POA: Diagnosis not present

## 2024-03-26 DIAGNOSIS — H52222 Regular astigmatism, left eye: Secondary | ICD-10-CM | POA: Diagnosis not present

## 2024-03-26 DIAGNOSIS — H35033 Hypertensive retinopathy, bilateral: Secondary | ICD-10-CM | POA: Diagnosis not present

## 2024-03-26 DIAGNOSIS — H35371 Puckering of macula, right eye: Secondary | ICD-10-CM | POA: Diagnosis not present

## 2024-03-26 LAB — HM DIABETES EYE EXAM

## 2024-03-28 ENCOUNTER — Ambulatory Visit: Payer: Self-pay

## 2024-04-14 ENCOUNTER — Other Ambulatory Visit (HOSPITAL_COMMUNITY): Payer: Self-pay | Admitting: Cardiology

## 2024-04-14 ENCOUNTER — Other Ambulatory Visit (HOSPITAL_COMMUNITY): Payer: Self-pay

## 2024-04-15 ENCOUNTER — Other Ambulatory Visit (HOSPITAL_COMMUNITY): Payer: Self-pay

## 2024-04-15 ENCOUNTER — Other Ambulatory Visit: Payer: Self-pay

## 2024-04-15 MED ORDER — APIXABAN 5 MG PO TABS
5.0000 mg | ORAL_TABLET | Freq: Two times a day (BID) | ORAL | 3 refills | Status: AC
  Filled 2024-04-15: qty 180, 90d supply, fill #0
  Filled 2024-10-04: qty 180, 90d supply, fill #1

## 2024-04-16 ENCOUNTER — Ambulatory Visit (INDEPENDENT_AMBULATORY_CARE_PROVIDER_SITE_OTHER)

## 2024-04-16 VITALS — Ht 74.0 in | Wt 236.0 lb

## 2024-04-16 DIAGNOSIS — Z0000 Encounter for general adult medical examination without abnormal findings: Secondary | ICD-10-CM

## 2024-04-16 NOTE — Progress Notes (Signed)
 Subjective:   George Carr is a 68 y.o. who presents for a Medicare Wellness preventive visit.  As a reminder, Annual Wellness Visits don't include a physical exam, and some assessments may be limited, especially if this visit is performed virtually. We may recommend an in-person follow-up visit with your provider if needed.  Visit Complete: Virtual I connected with  George Carr on 04/16/24 by a audio enabled telemedicine application and verified that I am speaking with the correct person using two identifiers.  Patient Location: Home  Provider Location: Home Office  I discussed the limitations of evaluation and management by telemedicine. The patient expressed understanding and agreed to proceed.  Vital Signs: Because this visit was a virtual/telehealth visit, some criteria may be missing or patient reported. Any vitals not documented were not able to be obtained and vitals that have been documented are patient reported.  VideoDeclined- This patient declined Librarian, academic. Therefore the visit was completed with audio only.  Persons Participating in Visit: Patient.  AWV Questionnaire: Yes: Patient Medicare AWV questionnaire was completed by the patient on 04/09/24; I have confirmed that all information answered by patient is correct and no changes since this date.  Cardiac Risk Factors include: advanced age (>61men, >21 women);diabetes mellitus;dyslipidemia;hypertension;male gender     Objective:     Today's Vitals   04/16/24 1458  Weight: 236 lb (107 kg)  Height: 6\' 2"  (1.88 m)   Body mass index is 30.3 kg/m.     04/16/2024    3:05 PM 03/16/2023    8:22 AM 11/11/2019   12:00 PM 11/10/2019   12:40 PM 09/09/2016    7:08 AM  Advanced Directives  Does Patient Have a Medical Advance Directive? No Yes Yes Yes Yes  Type of Special educational needs teacher of [place];Living will Living will  Healthcare Power of [place];Living will   Does patient want to make changes to medical advance directive?   No - Patient declined    Copy of Healthcare Power of Attorney in Chart?  No - copy requested   No - copy requested  Would patient like information on creating a medical advance directive? Yes (MAU/Ambulatory/Procedural Areas - Information given)        Current Medications (verified) Outpatient Encounter Medications as of 04/16/2024  Medication Sig   amLODipine  (NORVASC ) 10 MG tablet Take 1 tablet (10 mg total) by mouth daily.   apixaban  (ELIQUIS ) 5 MG TABS tablet Take 1 tablet (5 mg total) by mouth 2 (two) times daily.   atorvastatin  (LIPITOR) 10 MG tablet Take 0.5 tablets (5 mg total) by mouth daily.   cyanocobalamin  1000 MCG tablet Take 1 tablet by mouth daily.   ferrous sulfate 325 (65 FE) MG tablet Take 1 tablet by mouth daily.   flecainide  (TAMBOCOR ) 50 MG tablet Take 1 tablet (50 mg total) by mouth 2 (two) times daily.   fluticasone  (FLONASE ) 50 MCG/ACT nasal spray Place 2 sprays into both nostrils as needed for allergies or rhinitis.   glucose blood (ONETOUCH ULTRA TEST) test strip Use twice daily to check blood sugar.   DX E11.69   lisinopril  (ZESTRIL ) 40 MG tablet Take 1 tablet (40 mg total) by mouth daily.   loratadine (CLARITIN) 10 MG tablet Take 10 mg by mouth daily as needed for allergies.   metoprolol  succinate (TOPROL -XL) 50 MG 24 hr tablet Take 1 tablet (50 mg total) by mouth daily with or immediately following a meal.   pantoprazole  (PROTONIX ) 40  MG tablet Take 1 tablet (40 mg total) by mouth 2 (two) times daily in the morning and evening before meals   tirzepatide  (MOUNJARO ) 12.5 MG/0.5ML Pen Inject 12.5 mg into the skin once a week for 28 days.   [START ON 04/18/2024] tirzepatide  (MOUNJARO ) 15 MG/0.5ML Pen Inject 15 mg into the skin once a week.   No facility-administered encounter medications on file as of 04/16/2024.    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)    Obesity, unspecified    Sleep apnea    Type II or unspecified type diabetes mellitus without mention of complication, not stated as uncontrolled    Past Surgical History:  Procedure Laterality Date   cornea radial keratotomy Bilateral 11/29/2003   EP IMPLANTABLE DEVICE N/A 09/09/2016   Procedure: Loop Recorder Insertion;  Surgeon: Jolly Needle, MD;  Location: [facility];  Service: Cardiovascular;  Laterality: N/A;   implantable loop recorder removal  04/20/2022   MDT Reveal LINQ removed for battery at [facility]   KNEE ARTHROTOMY Right 11/28/1976   NASAL SEPTOPLASTY W/ TURBINOPLASTY  11/28/1997   Family History  Problem Relation Age of Onset   Hypertension Father    Other Father        pat/mat uncles, malignant neoplasm of the prostate gland   Stroke Father    Diabetes Mother    Heart disease Maternal Aunt    Diabetes Other    Cancer Brother    Social History   Socioeconomic History   Marital status: Married    Spouse name: Not on file   Number of children: Not on file   Years of education: Not on file   Highest education level: Bachelor's degree (e.g., BA, AB, BS)  Occupational History   Not on file  Tobacco Use   Smoking status: Never    Passive exposure: Yes   Smokeless tobacco: Never  Vaping Use   Vaping status: Never Used  Substance and Sexual Activity   Alcohol use: Yes    Alcohol/week: 1.0 standard drink of alcohol    Types: 1 Shots of liquor per week    Comment: seldom   Drug use: No   Sexual activity: Yes    Partners: Female  Other Topics Concern   Not on file  Social History Narrative   Not on file   Social Drivers of Health   Financial Resource Strain: Low Risk  (04/16/2024)   Overall Financial Resource Strain (CARDIA)    Difficulty of Paying Living Expenses: Not hard at all  Food Insecurity: No Food Insecurity  (04/16/2024)   Hunger Vital Sign    Worried About Running Out of Food in the Last Year: Never true    Ran Out of Food in the Last Year: Never true  Transportation Needs: No Transportation Needs (04/16/2024)   PRAPARE - Administrator, Civil Service (Medical): No    Lack of Transportation (Non-Medical): No  Physical Activity: Insufficiently Active (04/16/2024)   Exercise Vital Sign    Days of Exercise per Week: 3 days    Minutes of Exercise per Session: 30 min  Stress: No Stress Concern Present (04/16/2024)   [employer] of Occupational Health - Occupational Stress Questionnaire    Feeling of Stress : Only a little  Social Connections: Moderately Integrated (04/16/2024)   Social Connection and  Isolation Panel [NHANES]    Frequency of Communication with Friends and Family: More than three times a week    Frequency of Social Gatherings with Friends and Family: Once a week    Attends Religious Services: 1 to 4 times per year    Active Member of [employer] or Organizations: No    Attends Engineer, structural: Never    Marital Status: Married    Tobacco Counseling Counseling given: Not Answered    Clinical Intake:  Pre-visit preparation completed: Yes  Pain : No/denies pain     Diabetes: Yes CBG done?: No Did pt. bring in CBG monitor from home?: No  Lab Results  Component Value Date   HGBA1C 6.5 02/14/2024   HGBA1C 7.7 (H) 11/14/2023   HGBA1C 8.6 (H) 08/08/2023     How often do you need to have someone help you when you read instructions, pamphlets, or other written materials from your doctor or pharmacy?: 1 - Never  Interpreter Needed?: No  Information entered by :: Seabron Cypress LPN   Activities of Daily Living     04/09/2024   10:46 AM  In your present state of health, do you have any difficulty performing the following activities:  Hearing? 0  Vision? 0  Difficulty concentrating or making decisions? 0  Walking or climbing stairs? 0   Dressing or bathing? 0  Doing errands, shopping? 0  Preparing Food and eating ? N  Using the Toilet? N  In the past six months, have you accidently leaked urine? N  Do you have problems with loss of bowel control? N  Managing your Medications? N  Managing your Finances? N  Housekeeping or managing your Housekeeping? N    Patient Care Team: Jobe Mulder, DO as PCP - General (Family Medicine)  Indicate any recent Medical Services you may have received from other than [facility] providers in the past year (date may be approximate).     Assessment:    This is a routine wellness examination for [place].  Hearing/Vision screen Hearing Screening - Comments:: Denies hearing difficulties   Vision Screening - Comments:: Wears rx glasses - up to date with routine eye exams with [facility]    Goals Addressed             This Visit's Progress    Remain active and independent   On track      Depression Screen     04/16/2024    3:04 PM 08/08/2023    7:20 AM 03/16/2023    8:24 AM 01/31/2023    1:39 PM  PHQ 2/9 Scores  PHQ - 2 Score 0 0 0 0  PHQ- 9 Score  0  0    Fall Risk     04/09/2024   10:46 AM 08/08/2023    7:20 AM 03/09/2023    6:33 PM 01/31/2023    1:51 PM 01/31/2023    1:39 PM  Fall Risk   Falls in the past year? 0 0 0 0 0  Number falls in past yr: 0 0 0 0 0  Injury with Fall? 0 0 0 0   Risk for fall due to : No Fall Risks No Fall Risks No Fall Risks  No Fall Risks  Follow up Falls prevention discussed;Education provided;Falls evaluation completed Falls evaluation completed Falls evaluation completed  Falls evaluation completed    MEDICARE RISK AT HOME:  Medicare Risk at Home Any stairs in or around the home?: (Patient-Rptd) No If so, are  there any without handrails?: (Patient-Rptd) No Home free of loose throw rugs in walkways, pet beds, electrical cords, etc?: (Patient-Rptd) Yes Adequate lighting in your home to reduce risk of falls?: (Patient-Rptd) Yes Life  alert?: (Patient-Rptd) No Use of a cane, walker or w/c?: (Patient-Rptd) No Grab bars in the bathroom?: (Patient-Rptd) No Shower chair or bench in shower?: (Patient-Rptd) Yes Elevated toilet seat or a handicapped toilet?: (Patient-Rptd) Yes  TIMED UP AND GO:  Was the test performed?  No  Cognitive Function: 6CIT completed        04/16/2024    3:07 PM 03/16/2023    8:30 AM  6CIT Screen  What Year? 0 points 0 points  What month? 0 points 0 points  What time? 0 points 0 points  Count back from 20 0 points 0 points  Months in reverse 0 points 0 points  Repeat phrase 0 points 0 points  Total Score 0 points 0 points    Immunizations Immunization History  Administered Date(s) Administered   Influenza,inj,Quad PF,6+ Mos 10/07/2017, 10/08/2018, 10/08/2018, 09/11/2019, 09/11/2019   Influenza-Unspecified 10/07/2017, 10/08/2018, 09/11/2019, 09/26/2022   PFIZER Comirnaty(Gray Top)Covid-19 Tri-Sucrose Vaccine 09/20/2020   PFIZER(Purple Top)SARS-COV-2 Vaccination 02/12/2020, 03/04/2020   PNEUMOCOCCAL CONJUGATE-20 07/14/2022   Pneumococcal Polysaccharide-23 04/16/2010   Td 09/10/1999   Td (Adult),5 Lf Tetanus Toxid, Preservative Free 07/09/2021   Tdap 04/16/2010   Zoster Recombinant(Shingrix) 06/17/2020, 08/17/2020    Screening Tests Health Maintenance  Topic Date Due   COVID-19 Vaccine (4 - 2024-25 season) 07/30/2023   INFLUENZA VACCINE  06/28/2024   Diabetic kidney evaluation - Urine ACR  08/07/2024   FOOT EXAM  08/07/2024   HEMOGLOBIN A1C  08/16/2024   Diabetic kidney evaluation - eGFR measurement  02/13/2025   OPHTHALMOLOGY EXAM  03/26/2025   Medicare Annual Wellness (AWV)  04/16/2025   DTaP/Tdap/Td (4 - Td or Tdap) 07/10/2031   Colonoscopy  03/18/2032   Pneumonia Vaccine 46+ Years old  Completed   Hepatitis C Screening  Completed   Zoster Vaccines- Shingrix  Completed   HPV VACCINES  Aged Out   Meningococcal B Vaccine  Aged Out    Health Maintenance  Health  Maintenance Due  Topic Date Due   COVID-19 Vaccine (4 - 2024-25 season) 07/30/2023    Additional Screening:  Vision Screening: Recommended annual ophthalmology exams for early detection of glaucoma and other disorders of the eye.  Dental Screening: Recommended annual dental exams for proper oral hygiene  Community Resource Referral / Chronic Care Management: CRR required this visit?  No   CCM required this visit?  No   Plan:    I have personally reviewed and noted the following in the patient's chart:   Medical and social history Use of alcohol, tobacco or illicit drugs  Current medications and supplements including opioid prescriptions. Patient is not currently taking opioid prescriptions. Functional ability and status Nutritional status Physical activity Advanced directives List of other physicians Hospitalizations, surgeries, and ER visits in previous 12 months Vitals Screenings to include cognitive, depression, and falls Referrals and appointments  In addition, I have reviewed and discussed with patient certain preventive protocols, quality metrics, and best practice recommendations. A written personalized care plan for preventive services as well as general preventive health recommendations were provided to patient.   Seabron Cypress [place], [place]   1/61/0960   After Visit Summary: (MyChart) Due to this being a telephonic visit, the after visit summary with patients personalized plan was offered to patient via MyChart   Notes: Nothing significant  to report at this time.

## 2024-04-16 NOTE — Patient Instructions (Signed)
 Mr. Juba , Thank you for taking time out of your busy schedule to complete your Annual Wellness Visit with me. I enjoyed our conversation and look forward to speaking with you again next year. I, as well as your care team,  appreciate your ongoing commitment to your health goals. Please review the following plan we discussed and let me know if I can assist you in the future. Your Game plan/ To Do List    Follow up Visits: Next Medicare AWV with our clinical staff: In 1 year    Have you seen your provider in the last 6 months (3 months if uncontrolled diabetes)? Yes Next Office Visit with your provider: 08/19/24 @ 7:30  Clinician Recommendations:  Aim for 30 minutes of exercise or brisk walking, 6-8 glasses of water, and 5 servings of fruits and vegetables each day.       This is a list of the screening recommended for you and due dates:  Health Maintenance  Topic Date Due   COVID-19 Vaccine (4 - 2024-25 season) 07/30/2023   Flu Shot  06/28/2024   Yearly kidney health urinalysis for diabetes  08/07/2024   Complete foot exam   08/07/2024   Hemoglobin A1C  08/16/2024   Yearly kidney function blood test for diabetes  02/13/2025   Eye exam for diabetics  03/26/2025   Medicare Annual Wellness Visit  04/16/2025   DTaP/Tdap/Td vaccine (4 - Td or Tdap) 07/10/2031   Colon Cancer Screening  03/18/2032   Pneumonia Vaccine  Completed   Hepatitis C Screening  Completed   Zoster (Shingles) Vaccine  Completed   HPV Vaccine  Aged Out   Meningitis B Vaccine  Aged Out    Advanced directives: (ACP Link)Information on Advanced Care Planning can be found at [place]  Secretary of [facility] Advance Health Care Directives Advance Health Care Directives. [email or web address]   Advance Care Planning is important because it:  [x]  Makes sure you receive the medical care that is consistent with your values, goals, and preferences  [x]  It provides guidance to your family and loved ones and reduces their decisional  burden about whether or not they are making the right decisions based on your wishes.  Follow the link provided in your after visit summary or read over the paperwork we have mailed to you to help you started getting your Advance Directives in place. If you need assistance in completing these, please reach out to us  so that we can help you!  See attachments for Preventive Care and Fall Prevention Tips.

## 2024-05-07 ENCOUNTER — Other Ambulatory Visit (HOSPITAL_COMMUNITY): Payer: Self-pay

## 2024-05-07 ENCOUNTER — Other Ambulatory Visit: Payer: Self-pay | Admitting: Family Medicine

## 2024-05-07 MED ORDER — MOUNJARO 12.5 MG/0.5ML ~~LOC~~ SOAJ
12.5000 mg | SUBCUTANEOUS | 0 refills | Status: AC
  Filled 2024-05-07: qty 2, 28d supply, fill #0

## 2024-05-24 ENCOUNTER — Encounter: Payer: Self-pay | Admitting: Family Medicine

## 2024-06-09 ENCOUNTER — Other Ambulatory Visit (HOSPITAL_COMMUNITY): Payer: Self-pay

## 2024-06-10 ENCOUNTER — Other Ambulatory Visit (HOSPITAL_COMMUNITY): Payer: Self-pay

## 2024-06-14 ENCOUNTER — Other Ambulatory Visit (HOSPITAL_COMMUNITY): Payer: Self-pay

## 2024-06-14 MED ORDER — PANTOPRAZOLE SODIUM 40 MG PO TBEC
40.0000 mg | DELAYED_RELEASE_TABLET | Freq: Two times a day (BID) | ORAL | 0 refills | Status: DC
  Filled 2024-06-14: qty 180, 90d supply, fill #0

## 2024-06-15 ENCOUNTER — Other Ambulatory Visit (HOSPITAL_COMMUNITY): Payer: Self-pay

## 2024-08-08 ENCOUNTER — Ambulatory Visit (HOSPITAL_COMMUNITY)
Admission: RE | Admit: 2024-08-08 | Discharge: 2024-08-08 | Disposition: A | Discharge status: Home / Self Care | Source: Ambulatory Visit | Attending: Cardiology | Admitting: Cardiology

## 2024-08-08 ENCOUNTER — Encounter (HOSPITAL_COMMUNITY): Payer: Self-pay | Admitting: Cardiology

## 2024-08-08 VITALS — BP 112/70 | HR 73 | Wt 228.6 lb

## 2024-08-08 DIAGNOSIS — E782 Mixed hyperlipidemia: Secondary | ICD-10-CM

## 2024-08-08 DIAGNOSIS — I10 Essential (primary) hypertension: Secondary | ICD-10-CM | POA: Diagnosis not present

## 2024-08-08 DIAGNOSIS — Z8616 Personal history of COVID-19: Secondary | ICD-10-CM | POA: Insufficient documentation

## 2024-08-08 DIAGNOSIS — Z7901 Long term (current) use of anticoagulants: Secondary | ICD-10-CM | POA: Diagnosis not present

## 2024-08-08 DIAGNOSIS — Z6829 Body mass index (BMI) 29.0-29.9, adult: Secondary | ICD-10-CM | POA: Insufficient documentation

## 2024-08-08 DIAGNOSIS — I4510 Unspecified right bundle-branch block: Secondary | ICD-10-CM | POA: Insufficient documentation

## 2024-08-08 DIAGNOSIS — E119 Type 2 diabetes mellitus without complications: Secondary | ICD-10-CM | POA: Insufficient documentation

## 2024-08-08 DIAGNOSIS — I480 Paroxysmal atrial fibrillation: Secondary | ICD-10-CM | POA: Diagnosis not present

## 2024-08-08 DIAGNOSIS — E669 Obesity, unspecified: Secondary | ICD-10-CM | POA: Insufficient documentation

## 2024-08-08 DIAGNOSIS — I493 Ventricular premature depolarization: Secondary | ICD-10-CM | POA: Diagnosis not present

## 2024-08-08 DIAGNOSIS — Z833 Family history of diabetes mellitus: Secondary | ICD-10-CM | POA: Diagnosis not present

## 2024-08-08 DIAGNOSIS — E785 Hyperlipidemia, unspecified: Secondary | ICD-10-CM | POA: Insufficient documentation

## 2024-08-08 DIAGNOSIS — Z8249 Family history of ischemic heart disease and other diseases of the circulatory system: Secondary | ICD-10-CM | POA: Diagnosis not present

## 2024-08-08 DIAGNOSIS — I491 Atrial premature depolarization: Secondary | ICD-10-CM | POA: Diagnosis not present

## 2024-08-08 DIAGNOSIS — Z7985 Long-term (current) use of injectable non-insulin antidiabetic drugs: Secondary | ICD-10-CM | POA: Diagnosis not present

## 2024-08-08 DIAGNOSIS — G4733 Obstructive sleep apnea (adult) (pediatric): Secondary | ICD-10-CM | POA: Insufficient documentation

## 2024-08-08 DIAGNOSIS — Z79899 Other long term (current) drug therapy: Secondary | ICD-10-CM | POA: Insufficient documentation

## 2024-08-08 LAB — BASIC METABOLIC PANEL WITH GFR
Anion gap: 9 (ref 5–15)
BUN: 19 mg/dL (ref 8–23)
CO2: 27 mmol/L (ref 22–32)
Calcium: 9.8 mg/dL (ref 8.9–10.3)
Chloride: 102 mmol/L (ref 98–111)
Creatinine, Ser: 0.93 mg/dL (ref 0.61–1.24)
GFR, Estimated: >60 mL/min (ref >60)
Glucose, Bld: 107 mg/dL — ABNORMAL HIGH (ref 70–99)
Potassium: 3.7 mmol/L (ref 3.5–5.1)
Sodium: 138 mmol/L (ref 135–145)

## 2024-08-08 LAB — CBC
HCT: 41.2 % (ref 39.0–52.0)
Hemoglobin: 14.3 g/dL (ref 13.0–17.0)
MCH: 31.4 pg (ref 26.0–34.0)
MCHC: 34.7 g/dL (ref 30.0–36.0)
MCV: 90.5 fL (ref 80.0–100.0)
Platelets: 273 K/uL (ref 150–400)
RBC: 4.55 MIL/uL (ref 4.22–5.81)
RDW: 12.8 % (ref 11.5–15.5)
WBC: 7.1 K/uL (ref 4.0–10.5)
nRBC: 0 % (ref 0.0–0.2)

## 2024-08-08 NOTE — Progress Notes (Signed)
 ID:  George Carr, DOB 06-05-1956, MRN [id number]   Provider location: [facility] Type of Visit: Established patient   PCP:  Frann Mabel Mt, DO  Cardiologist: Dr. Rolan  Chief complaint: Atrial fibrillation followup   History of Present Illness: George Carr is a 68 y.o. male who has a history of type II diabetes, HTN, and atrial fibrillation.  Atrial fibrillation was first noted in 2/15 when he went for a colonoscopy.  He was unaware of it, no symptoms.  He was started on sotalol  and Xarelto, later switched to Eliquis .  He went back into NSR.  He denies exertional dyspnea or chest pain.  He has good exercise tolerance.  He tries to walk for exercise as much as he can.  He has OSA and is using CPAP.  Echo was done in 3/15, showing preserved LV systolic function.  Lexiscan Cardiolite was done in 3/15 showing no ischemia.    Patient was doing well until 07/25/14.  On that day, he had a weak spell while walking through his kitchen.  He did not pass out completely but felt weak and lightheaded and slumped down to the floor.  He did not feel chest pain or palpitations.  He felt back to normal after about 30 seconds.  He has not had any further episodes like this.  He was seen in this office in on 8/28 after the spell.  He was noted to be in atrial fibrillation with rate control.  He did not feel the atrial fibrillation.  Presyncopal episode was thought to be possible related to postural hypotension.  HCTZ was stopped and he has had no further dizziness.     Due to breakthrough atrial fibrillation, he was transitioned from sotalol  to flecainide .    In 12/20, he had COVID-19 PNA, was hospitalized for about a week.   He returns for followup of atrial fibrillation.  He is in NSR today.  Now on Mounjaro , weight down 8 lbs.  No exertional dyspnea or chest pain.  Walks his dog for about 1.5 miles for exercise. No BRBPR/melena. No palpitations or  lightheadedness.     Labs (2/24): hgb 14.3 Labs (3/25): LDL 41, K 3.8, creatinine 9.15  ECG (personally reviewed): NSR, RBBB, LAFB  PMH: 1. Atrial fibrillation: Paroxysmal.  - ETT 3/19 was normal.  - Echo (5/18): EF 60%, RV normal.  2. HTN 3. Type 2 diabetes 4. Hyperlipidemia 5. COVID-19 PNA in 12/20.  6. OSA: Uses CPAP.  7. GERD with esophagitis.  8. PVCs: 7 day Zio in 5/23 with occasional PVCs, no AF.   Current Outpatient Medications  Medication Sig Dispense Refill   amLODipine  (NORVASC ) 10 MG tablet Take 1 tablet (10 mg total) by mouth daily. 90 tablet 3   apixaban  (ELIQUIS ) 5 MG TABS tablet Take 1 tablet (5 mg total) by mouth 2 (two) times daily. 180 tablet 3   atorvastatin  (LIPITOR) 10 MG tablet Take 0.5 tablets (5 mg total) by mouth daily. 45 tablet 3   cyanocobalamin  1000 MCG tablet Take 1 tablet by mouth daily.     ferrous sulfate 325 (65 FE) MG tablet Take 1 tablet by mouth daily.     flecainide  (TAMBOCOR ) 50 MG tablet Take 1 tablet (50 mg total) by mouth 2 (two) times daily. 180 tablet 3   fluticasone  (FLONASE ) 50 MCG/ACT nasal spray Place 2 sprays into both nostrils as needed for allergies or rhinitis.     glucose blood (  ONETOUCH ULTRA TEST) test strip Use twice daily to check blood sugar.   DX E11.69 200 each 3   lisinopril  (ZESTRIL ) 40 MG tablet Take 1 tablet (40 mg total) by mouth daily. 90 tablet 3   loratadine (CLARITIN) 10 MG tablet Take 10 mg by mouth daily as needed for allergies.     metoprolol  succinate (TOPROL -XL) 50 MG 24 hr tablet Take 1 tablet (50 mg total) by mouth daily with or immediately following a meal. 90 tablet 3   pantoprazole  (PROTONIX ) 40 MG tablet Take 1 tablet (40 mg total) by mouth 2 (two) times daily in the morning and evening before meals 180 tablet 3   tirzepatide  (MOUNJARO ) 15 MG/0.5ML Pen Inject 15 mg into the skin once a week. 6 mL 2   No current facility-administered medications for this encounter.    Allergies:   Penicillins    Social History:  The patient  reports that he has never smoked. He has been exposed to tobacco smoke. He has never used smokeless tobacco. He reports current alcohol use of about 1.0 standard drink of alcohol per week. He reports that he does not use drugs.   Family History:  The patient's family history includes Cancer in his brother; Diabetes in his mother and another family member; Heart disease in his maternal aunt; Hypertension in his father; Other in his father; Stroke in his father.   ROS:  Please see the history of present illness.   All other systems are personally reviewed and negative.   Exam:  BP 112/70   Pulse 73   Wt 103.7 kg (228 lb 9.6 oz)   SpO2 100%   BMI 29.35 kg/m  General: NAD Neck: No JVD, no thyromegaly or thyroid  nodule.  Lungs: Clear to auscultation bilaterally with normal respiratory effort. CV: Nondisplaced PMI.  Heart regular S1/S2, no S3/S4, no murmur.  No peripheral edema.  No carotid bruit.  Normal pedal pulses.  Abdomen: Soft, nontender, no hepatosplenomegaly, no distention.  Skin: Intact without lesions or rashes.  Neurologic: Alert and oriented x 3.  Psych: Normal affect. Extremities: No clubbing or cyanosis.  HEENT: Normal.   Recent Labs: 02/14/2024: ALT 28 08/08/2024: BUN 19; Creatinine, Ser 0.93; Hemoglobin 14.3; Platelets 273; Potassium 3.7; Sodium 138  Personally reviewed   Wt Readings from Last 3 Encounters:  08/08/24 103.7 kg (228 lb 9.6 oz)  04/16/24 107 kg (236 lb)  02/21/24 107.2 kg (236 lb 6.4 oz)     ASSESSMENT AND PLAN:  1. Paroxysmal atrial fibrillation:  Multiple atrial fibrillation risk factors including HTN, diabetes, and OSA.  No recent palpitations, he is in NSR today. Flecainide  seems to be controlling atrial fibrillation better than sotalol .  ETT on flecainide  in 3/19 looked ok.  - Continue flecainide .  - Continue Toprol  XL 50 mg daily.  - Continue Eliquis  - If he develops breakthrough atrial fibrillation, ablation  would be an option.    2. HTN: Controlled.  - Continue amlodipine  and lisinopril . BMET today.  3. Hyperlipidemia: Good lipids in 3/25.  - Continue atorvastatin .   - Check lipoprotein (a) 4. OSA: Continue to use CPAP.   5. Obesity: Continue to work on weight loss. - Continue Mounjaro .   6. PVCs: No palpitations.  Zio monitor in 5/23 showed infrequent PVCs.   Followup in 6 months.   I spent 22 minutes reviewing records, interviewing/examining patient, and managing orders.   Signed, Ezra Shuck, MD  08/08/2024  [facility] [address]  [facility] [place] [address] [phone] (office) [phone] (fax)

## 2024-08-08 NOTE — Patient Instructions (Signed)
 There has been no changes to your medications.  Labs done today, your results will be available in MyChart, we will contact you for abnormal readings.  Your physician recommends that you schedule a follow-up appointment in: 6 months ( March 2026) ** PLEASE CALL THE OFFICE IN JANUARY 2026 TO ARRANGE YOUR FOLLOW UP APPOINTMENT.**  If you have any questions or concerns before your next appointment please send us  a message through [place] or call our office at [phone].    TO LEAVE A MESSAGE FOR THE NURSE SELECT OPTION 2, PLEASE LEAVE A MESSAGE INCLUDING: YOUR NAME DATE OF BIRTH CALL BACK NUMBER REASON FOR CALL**this is important as we prioritize the call backs  YOU WILL RECEIVE A CALL BACK THE SAME DAY AS LONG AS YOU CALL BEFORE 4:00 PM  At the [facility], you and your health needs are our priority. As part of our continuing mission to provide you with exceptional heart care, we have created designated Provider Care Teams. These Care Teams include your primary Cardiologist (physician) and Advanced Practice Providers (APPs- Physician Assistants and Nurse Practitioners) who all work together to provide you with the care you need, when you need it.   You may see any of the following providers on your designated Care Team at your next follow up: Dr Toribio Fuel Dr Ezra Shuck Dr. Ria Commander Dr. Morene Brownie Amy Lenetta, NP Caffie Shed, [place] [facility] [place], [place] Beckey Coe, NP [place] Lee, NP Ellouise Class, NP Tinnie Redman, PharmD Jaun Bash, PharmD   Please be sure to bring in all your medications bottles to every appointment.    Thank you for choosing [facility]

## 2024-08-10 LAB — LIPOPROTEIN A (LPA): Lipoprotein (a): 107.4 nmol/L — ABNORMAL HIGH (ref <75.0)

## 2024-08-11 ENCOUNTER — Ambulatory Visit (HOSPITAL_COMMUNITY): Payer: Self-pay | Admitting: Cardiology

## 2024-08-12 DIAGNOSIS — D2262 Melanocytic nevi of left upper limb, including shoulder: Secondary | ICD-10-CM | POA: Diagnosis not present

## 2024-08-12 DIAGNOSIS — L570 Actinic keratosis: Secondary | ICD-10-CM | POA: Diagnosis not present

## 2024-08-12 DIAGNOSIS — Z129 Encounter for screening for malignant neoplasm, site unspecified: Secondary | ICD-10-CM | POA: Diagnosis not present

## 2024-08-12 DIAGNOSIS — D2261 Melanocytic nevi of right upper limb, including shoulder: Secondary | ICD-10-CM | POA: Diagnosis not present

## 2024-08-12 DIAGNOSIS — D239 Other benign neoplasm of skin, unspecified: Secondary | ICD-10-CM | POA: Diagnosis not present

## 2024-08-12 DIAGNOSIS — L720 Epidermal cyst: Secondary | ICD-10-CM | POA: Diagnosis not present

## 2024-08-12 DIAGNOSIS — L814 Other melanin hyperpigmentation: Secondary | ICD-10-CM | POA: Diagnosis not present

## 2024-08-12 DIAGNOSIS — D2362 Other benign neoplasm of skin of left upper limb, including shoulder: Secondary | ICD-10-CM | POA: Diagnosis not present

## 2024-08-12 DIAGNOSIS — D225 Melanocytic nevi of trunk: Secondary | ICD-10-CM | POA: Diagnosis not present

## 2024-08-14 ENCOUNTER — Telehealth (HOSPITAL_COMMUNITY): Payer: Self-pay | Admitting: *Deleted

## 2024-08-14 DIAGNOSIS — E782 Mixed hyperlipidemia: Secondary | ICD-10-CM

## 2024-08-14 DIAGNOSIS — I480 Paroxysmal atrial fibrillation: Secondary | ICD-10-CM

## 2024-08-14 NOTE — Telephone Encounter (Signed)
 Called patient per Dr. Rolan with following lab results and instructions:  Elevated Lp(a) suggests higher cardiac risk. LDL is very low on low dose of atorvastatin  so do not think he will qualify for Repatha. However, I think he should have a coronary calcium  score done for risk stratification.  Informed him it would be $99.00 out of pocket (does not go through insurance). Pt verbalized understanding and agreement to calcium  score CT. Orders placed, they should call him to schedule.

## 2024-08-19 ENCOUNTER — Encounter: Payer: Self-pay | Admitting: Family Medicine

## 2024-08-19 ENCOUNTER — Ambulatory Visit: Admitting: Family Medicine

## 2024-08-19 ENCOUNTER — Ambulatory Visit: Payer: Self-pay | Admitting: Family Medicine

## 2024-08-19 VITALS — BP 136/78 | HR 78 | Temp 98.1°F | Resp 18 | Ht 74.0 in | Wt 228.0 lb

## 2024-08-19 DIAGNOSIS — Z0000 Encounter for general adult medical examination without abnormal findings: Secondary | ICD-10-CM | POA: Diagnosis not present

## 2024-08-19 DIAGNOSIS — E1165 Type 2 diabetes mellitus with hyperglycemia: Secondary | ICD-10-CM

## 2024-08-19 DIAGNOSIS — R809 Proteinuria, unspecified: Secondary | ICD-10-CM

## 2024-08-19 LAB — LIPID PANEL
Cholesterol: 125 mg/dL (ref 0–200)
HDL: 42.8 mg/dL (ref >39.00)
LDL Cholesterol: 61 mg/dL (ref 0–99)
NonHDL: 82.53
Total CHOL/HDL Ratio: 3
Triglycerides: 106 mg/dL (ref 0.0–149.0)
VLDL: 21.2 mg/dL (ref 0.0–40.0)

## 2024-08-19 LAB — COMPREHENSIVE METABOLIC PANEL WITH GFR
ALT: 17 U/L (ref 0–53)
AST: 13 U/L (ref 0–37)
Albumin: 4.5 g/dL (ref 3.5–5.2)
Alkaline Phosphatase: 90 U/L (ref 39–117)
BUN: 15 mg/dL (ref 6–23)
CO2: 31 meq/L (ref 19–32)
Calcium: 9.8 mg/dL (ref 8.4–10.5)
Chloride: 102 meq/L (ref 96–112)
Creatinine, Ser: 0.97 mg/dL (ref 0.40–1.50)
GFR: 80.54 mL/min (ref >60.00)
Glucose, Bld: 178 mg/dL — ABNORMAL HIGH (ref 70–99)
Potassium: 3.6 meq/L (ref 3.5–5.1)
Sodium: 142 meq/L (ref 135–145)
Total Bilirubin: 0.6 mg/dL (ref 0.2–1.2)
Total Protein: 7 g/dL (ref 6.0–8.3)

## 2024-08-19 LAB — CBC
HCT: 41.2 % (ref 39.0–52.0)
Hemoglobin: 13.8 g/dL (ref 13.0–17.0)
MCHC: 33.5 g/dL (ref 30.0–36.0)
MCV: 92 fl (ref 78.0–100.0)
Platelets: 281 K/uL (ref 150.0–400.0)
RBC: 4.48 Mil/uL (ref 4.22–5.81)
RDW: 13.8 % (ref 11.5–15.5)
WBC: 6.6 K/uL (ref 4.0–10.5)

## 2024-08-19 LAB — MICROALBUMIN / CREATININE URINE RATIO
Creatinine,U: 365.4 mg/dL
Microalb Creat Ratio: 79 mg/g — ABNORMAL HIGH (ref 0.0–30.0)
Microalb, Ur: 28.9 mg/dL — ABNORMAL HIGH (ref 0.0–1.9)

## 2024-08-19 LAB — HEMOGLOBIN A1C: Hgb A1c MFr Bld: 6.8 % — ABNORMAL HIGH (ref 4.6–6.5)

## 2024-08-19 NOTE — Progress Notes (Signed)
 Chief Complaint  Patient presents with   Annual Exam    Well Male George Carr is here for a complete physical.   His last physical was >1 year ago.  Current diet: in general, a healthy diet.   Current exercise: walking Weight trend: intentionally decreasing Fatigue out of ordinary? No. Seat belt? Yes.   Advanced directive? 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)    Obesity, unspecified    Sleep apnea    Type II or unspecified type diabetes mellitus without mention of complication, not stated as uncontrolled      Past Surgical History:  Procedure Laterality Date   cornea radial keratotomy Bilateral 11/29/2003   EP IMPLANTABLE DEVICE N/A 09/09/2016   Procedure: Loop Recorder Insertion;  Surgeon: Lynwood Rakers, MD;  Location: [facility];  Service: Cardiovascular;  Laterality: N/A;   implantable loop recorder removal  04/20/2022   MDT Reveal LINQ removed for battery at [facility]   KNEE ARTHROTOMY Right 11/28/1976   NASAL SEPTOPLASTY W/ TURBINOPLASTY  11/28/1997    Medications  Current Outpatient Medications on File Prior to Visit  Medication Sig Dispense Refill   amLODipine  (NORVASC ) 10 MG tablet Take 1 tablet (10 mg total) by mouth daily. 90 tablet 3   apixaban  (ELIQUIS ) 5 MG TABS tablet Take 1 tablet (5 mg total) by mouth 2 (two) times daily. 180 tablet 3   atorvastatin  (LIPITOR) 10 MG tablet Take 0.5 tablets (5 mg total) by mouth daily. 45 tablet 3   cyanocobalamin  1000 MCG tablet Take 1 tablet by mouth daily.     ferrous sulfate 325 (65 FE) MG tablet Take 1 tablet by mouth daily.     flecainide  (TAMBOCOR ) 50 MG tablet Take 1 tablet (50 mg total) by mouth 2 (two) times daily. 180 tablet 3   fluticasone  (FLONASE )  50 MCG/ACT nasal spray Place 2 sprays into both nostrils as needed for allergies or rhinitis.     glucose blood (ONETOUCH ULTRA TEST) test strip Use twice daily to check blood sugar.   DX E11.69 200 each 3   lisinopril  (ZESTRIL ) 40 MG tablet Take 1 tablet (40 mg total) by mouth daily. 90 tablet 3   loratadine (CLARITIN) 10 MG tablet Take 10 mg by mouth daily as needed for allergies.     metoprolol  succinate (TOPROL -XL) 50 MG 24 hr tablet Take 1 tablet (50 mg total) by mouth daily with or immediately following a meal. 90 tablet 3   pantoprazole  (PROTONIX ) 40 MG tablet Take 1 tablet (40 mg total) by mouth 2 (two) times daily in the morning and evening before meals 180 tablet 3   tirzepatide  (MOUNJARO ) 15 MG/0.5ML Pen Inject 15 mg into the skin once a week. 6 mL 2    Allergies Allergies  Allergen Reactions   Penicillins Hives    Has patient had a PCN reaction causing immediate rash, facial/tongue/throat swelling, SOB or lightheadedness with hypotension: Yes Has patient had a PCN reaction causing severe rash involving mucus membranes or skin necrosis: No Has patient had a PCN reaction that required hospitalization No Has patient had a PCN reaction occurring within the last 10 years: No If all of the above answers are NO, then may proceed with Cephalosporin use.  Family History Family History  Problem Relation Age of Onset   Hypertension Father    Other Father        pat/mat uncles, malignant neoplasm of the prostate gland   Stroke Father    Diabetes Mother    Heart disease Maternal Aunt    Diabetes Other    Cancer Brother     Review of Systems: Constitutional:  no fevers Eye:  no recent significant change in vision Ears:  No changes in hearing Nose/Mouth/Throat:  no complaints of nasal congestion, no sore throat Cardiovascular: no chest pain Respiratory:  No shortness of breath Gastrointestinal:  No change in bowel habits GU:  No frequency Integumentary:  no new abnormal  skin lesions reported Neurologic:  no headaches Endocrine:  denies unexplained weight changes  Exam BP 136/78   Pulse 78   Temp 98.1 F (36.7 C)   Resp 18   Ht 6' 2 (1.88 m)   Wt 228 lb (103.4 kg)   SpO2 98%   BMI 29.27 kg/m  General:  well developed, well nourished, in no apparent distress Skin: slightly macerated tissue between 4th/5th digits of feet b/l; otherwise no significant moles, warts, or growths Head:  no masses, lesions, or tenderness Eyes:  pupils equal and round, sclera anicteric without injection Ears:  canals without lesions, TMs shiny without retraction, no obvious effusion, no erythema Nose:  nares patent, mucosa normal Throat/Pharynx:  lips and gingiva without lesion; tongue and uvula midline; non-inflamed pharynx; no exudates or postnasal drainage Lungs:  clear to auscultation, breath sounds equal bilaterally, no respiratory distress Cardio:  regular rate and rhythm, no LE edema or bruits; DP pulses 3+ bilaterally Rectal: Deferred GI: BS+, S, NT, ND, no masses or organomegaly Musculoskeletal:  symmetrical muscle groups noted without atrophy or deformity Neuro: Sensation intact to pinprick over both feet; gait normal; deep tendon reflexes normal and symmetric Psych: well oriented with normal range of affect and appropriate judgment/insight  Assessment and Plan  Well adult exam  Type 2 diabetes mellitus with hyperglycemia, without long-term current use of insulin  (HCC) - Plan: Comprehensive metabolic panel with GFR, CBC, Lipid panel, Hemoglobin A1c, Microalbumin / creatinine urine ratio   Well 68 y.o. male. Counseled on diet and exercise. Advanced directive form requested today.  Other orders as above. Flu shot to receive at work next mo. Follow up in 6 mo.  The patient voiced understanding and agreement to the plan.  Mabel Mt [place], DO 08/19/24 7:47 AM

## 2024-08-19 NOTE — Patient Instructions (Signed)
 Give [name] 2-3 business days to get the results of your labs back.   Keep the diet clean and stay active.  Please get me a copy of your advanced directive form at your convenience.   Let us know if you need anything.

## 2024-08-22 ENCOUNTER — Ambulatory Visit (HOSPITAL_COMMUNITY)
Admission: RE | Admit: 2024-08-22 | Discharge: 2024-08-22 | Disposition: A | Discharge status: Home / Self Care | Payer: Self-pay | Source: Ambulatory Visit | Attending: Cardiology | Admitting: Cardiology

## 2024-08-22 ENCOUNTER — Ambulatory Visit (HOSPITAL_COMMUNITY): Payer: Self-pay | Admitting: Cardiology

## 2024-08-22 DIAGNOSIS — E782 Mixed hyperlipidemia: Secondary | ICD-10-CM | POA: Insufficient documentation

## 2024-08-22 DIAGNOSIS — Z136 Encounter for screening for cardiovascular disorders: Secondary | ICD-10-CM | POA: Diagnosis not present

## 2024-08-22 DIAGNOSIS — I480 Paroxysmal atrial fibrillation: Secondary | ICD-10-CM | POA: Insufficient documentation

## 2024-08-22 DIAGNOSIS — I5032 Chronic diastolic (congestive) heart failure: Secondary | ICD-10-CM

## 2024-08-22 DIAGNOSIS — Z79899 Other long term (current) drug therapy: Secondary | ICD-10-CM | POA: Insufficient documentation

## 2024-09-06 ENCOUNTER — Other Ambulatory Visit (HOSPITAL_COMMUNITY): Payer: Self-pay

## 2024-09-06 DIAGNOSIS — Z860101 Personal history of adenomatous and serrated colon polyps: Secondary | ICD-10-CM | POA: Diagnosis not present

## 2024-09-06 DIAGNOSIS — K2270 Barrett's esophagus without dysplasia: Secondary | ICD-10-CM | POA: Diagnosis not present

## 2024-09-06 MED ORDER — PANTOPRAZOLE SODIUM 40 MG PO TBEC
DELAYED_RELEASE_TABLET | ORAL | 3 refills | Status: AC
  Filled 2024-09-06: qty 180, 90d supply, fill #0
  Filled 2024-12-09: qty 180, 90d supply, fill #1

## 2024-09-30 ENCOUNTER — Other Ambulatory Visit (HOSPITAL_COMMUNITY): Payer: Self-pay

## 2024-09-30 ENCOUNTER — Telehealth: Payer: Self-pay | Admitting: Pharmacist

## 2024-09-30 NOTE — Progress Notes (Unsigned)
 Patient ID: George Carr                 DOB: [birth year]                    MRN: [id number]      HPI: George Carr is a 68 y.o. male patient referred to lipid clinic by Dr.McLean PMH is significant for Afib, hypertension, obesity, OSA, T2DM, diabetic retinopathy.   Patient has been on atorvastatin  5 mg for long time. 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   Patient presented today in good spirit. Reports he was able to tolerate 10 mg atorvastatin  in the past but his LDL was at goal so he was advised to reduce dose to 5 mg daily. He never tried any other statin in the past. He is on Mounjaro  and he has lost 10 lbs. He eats fairly healthy diet and exercise 3-4 days a week - mainly cardio. Advise to incorporate some resistance exercise.   Reviewed options for lowering LDL cholesterol, including ezetimibe, PCSK-9 inhibitors, and inclisiran.  Discussed mechanisms of action, dosing, side effects and potential decreases in LDL cholesterol.  Also reviewed cost information and potential options for patient assistance.   Current Medications: Atorvastatin  5 mg daily  Intolerance: none  Risk Factors: CAC score454, CAD, Elevated Lpa,hypertension, obesity, OSA, T2DM, diabetic retinopathy  LDL goal: <55  Diet: low salt food, cut back on sweets  Eat out- 2 times per week  Factor dinners  Drink: water, 1 cup of coffee per day, soda - 1 glass per day   Exercise:walking 25--30 min - 3-4 times per week    Family History:  Relation Problem Comments  Mother - [name] (Alive) Diabetes     Father - [name] (Alive) Hypertension   Other pat/mat uncles, malignant neoplasm of the prostate gland  Stroke     Brother - [name] Cancer     Maternal Aunt - Scientist, [employer] (Alive) Heart disease     Maternal Grandmother (Deceased)   Maternal Grandfather (Deceased)   Paternal Grandmother (Deceased)   Paternal Grandfather (Deceased)   Other  Diabetes      Social History:  Alcohol: 2 drink per month  Smoking: never   Labs:  Lipid Panel     Component Value Date/Time   CHOL 125 08/19/2024 0749   TRIG 106.0 08/19/2024 0749   HDL 42.80 08/19/2024 0749   CHOLHDL 3 08/19/2024 0749   VLDL 21.2 08/19/2024 0749   LDLCALC 61 08/19/2024 0749    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 90 tablet 3   apixaban  (ELIQUIS ) 5 MG TABS tablet Take 1 tablet (5 mg total) by mouth 2 (two) times daily. 180 tablet 3   cyanocobalamin  1000 MCG tablet Take 1 tablet by mouth daily.     ferrous sulfate 325 (65 FE) MG tablet Take 1 tablet by mouth daily.     flecainide  (TAMBOCOR ) 50 MG tablet Take 1  tablet (50 mg total) by mouth 2 (two) times daily. 180 tablet 3   fluticasone  (FLONASE ) 50 MCG/ACT nasal spray Place 2 sprays into both nostrils as needed for allergies or rhinitis.     glucose blood (ONETOUCH ULTRA TEST) test strip Use twice daily to check blood sugar.   DX E11.69 200 each 3   lisinopril  (ZESTRIL ) 40 MG tablet Take 1 tablet (40 mg total) by mouth daily. 90 tablet 3   metoprolol  succinate (TOPROL -XL) 50 MG 24 hr tablet Take 1 tablet (50 mg total) by mouth daily with or immediately following a meal. 90 tablet 3   pantoprazole  (PROTONIX ) 40 MG tablet Take 1 tablet (40 mg total) by mouth 2 (two) times daily in the morning and evening before meals 180 tablet 3   pantoprazole  (PROTONIX ) 40 MG tablet Take 1 tablet (40 mg total) by mouth in the morning and 1 tablet (40 mg total) in the evening. Take  before meals. 180 tablet 3   tirzepatide  (MOUNJARO ) 15 MG/0.5ML Pen Inject 15 mg into the skin once a week. 6 mL 2   No current facility-administered medications on file prior to visit.    Allergies  Allergen Reactions   Penicillins Hives    Has patient had a PCN reaction causing immediate rash, facial/tongue/throat swelling, SOB or lightheadedness with hypotension: Yes Has patient had a PCN reaction causing severe rash involving mucus membranes or skin necrosis: No Has patient had a PCN reaction that required hospitalization No Has patient had a PCN reaction occurring within the last 10 years: No If all of the above answers are NO, then may proceed with Cephalosporin use.     Assessment/Plan:  1. 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  Discussed next potential options (ezetimibe, PCSK-9 inhibitors, bempedoic acid and inclisiran); cost, dosing efficacy, side effects  Reiterated significance of healthy lifestyle  Patient would like to try increased dose statin before adding new agent as in the past he was able to tolerate 10 mg dose of atorvastatin    Plan: Increase atorvastatin  dose from 5 mg daily to 10 mg daily  Patient to report on tolerance in 3 weeks  Repatha PA has been approved by insurance so if pt is unable to tolerate increased dose statin will add Repatha to max tolerated statin Lipid lab due in 2-3 months after therapy modification     Thank you,  Robbi Blanch, Pharm.D George Carr. [facility] [address], [place], [place] [address] Phone: [phone]; Fax: [phone]

## 2024-09-30 NOTE — Telephone Encounter (Signed)
 Pharmacy Patient Advocate Encounter   Received notification from Physician's Office that prior authorization for REPATHA is required/requested.   Insurance verification completed.   The patient is insured through [facility] ADVANTAGE/RX ADVANCE.   Per test claim: PA required; PA submitted to above mentioned insurance via Latent Key/confirmation #/EOC [id number] Status is pending

## 2024-09-30 NOTE — Telephone Encounter (Signed)
 Pharmacy Patient Advocate Encounter  Received notification from [facility] ADVANTAGE/RX ADVANCE that Prior Authorization for REPATHA has been APPROVED from 09/30/24 to 03/29/25. Ran test claim, Copay is $0. This test claim was processed through [facility]- copay amounts may vary at other pharmacies due to pharmacy/plan contracts, or as the patient moves through the different stages of their insurance plan.

## 2024-10-01 ENCOUNTER — Ambulatory Visit: Attending: Cardiology | Admitting: Pharmacist

## 2024-10-01 ENCOUNTER — Other Ambulatory Visit (HOSPITAL_COMMUNITY): Payer: Self-pay

## 2024-10-01 ENCOUNTER — Encounter: Payer: Self-pay | Admitting: Pharmacist

## 2024-10-01 DIAGNOSIS — E7849 Other hyperlipidemia: Secondary | ICD-10-CM

## 2024-10-01 MED ORDER — ATORVASTATIN CALCIUM 10 MG PO TABS
10.0000 mg | ORAL_TABLET | Freq: Every day | ORAL | 3 refills | Status: AC
  Filled 2024-10-01 – 2024-10-04 (×2): qty 90, 90d supply, fill #0

## 2024-10-01 NOTE — Patient Instructions (Signed)
 Your Results:             Your most recent labs Goal  Total Cholesterol 125 < 200  Triglycerides 106 < 150  HDL (happy/good cholesterol) 42.80 > 40  LDL (lousy/bad cholesterol 61 < 55   Medication changes: increase Lipitor dose from 5 mg to 10 mg daily. If not tolerated we will consider adding Repatha to max tolerated atorvastatin . Please call me to let me know how you tolerate Atorvastatin  10 mg dose in 3 weeks      Repatha is a cholesterol medication that improved your body's ability to get rid of bad cholesterol known as LDL. It can lower your LDL up to 60%! It is an injection that is given under the skin every 2 weeks. The medication often requires a prior authorization from your insurance company.  The most common side effects of Repatha include runny nose, symptoms of the common cold, rarely flu or flu-like symptoms, back/muscle pain in about 3-4% of the patients, and redness, pain, or bruising at the injection site.   Lab orders: We want to repeat labs after 2-3 months.  We will send you a lab order to remind you once we get closer to that time.

## 2024-10-01 NOTE — Assessment & Plan Note (Signed)
 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  Discussed next potential options (ezetimibe, PCSK-9 inhibitors, bempedoic acid and inclisiran); cost, dosing efficacy, side effects  Reiterated significance of healthy lifestyle  Patient would like to try increased dose statin before adding new agent as in the past he was able to tolerate 10 mg dose of atorvastatin    Plan: Increase atorvastatin  dose from 5 mg daily to 10 mg daily  Patient to report on tolerance in 3 weeks  Repatha PA has been approved by insurance so if pt is unable to tolerate increased dose statin will add Repatha to max tolerated statin Lipid lab due in 2-3 months after therapy modification

## 2024-10-02 ENCOUNTER — Other Ambulatory Visit (HOSPITAL_COMMUNITY): Payer: Self-pay

## 2024-10-04 ENCOUNTER — Other Ambulatory Visit (HOSPITAL_COMMUNITY): Payer: Self-pay

## 2024-10-04 ENCOUNTER — Other Ambulatory Visit: Payer: Self-pay

## 2024-10-21 ENCOUNTER — Telehealth: Payer: Self-pay | Admitting: Pharmacist

## 2024-10-21 NOTE — Telephone Encounter (Signed)
 Saw pt early Nov. Pt decided to try increased dose atorvastatin  ( 5 mg to 10 mg). Called to see how he is tolerating it. Pt reports tolerating 10 mg daily dose. LDLc was 61 while he was on 5 mg atorvastatin .   Informed if at any point he does not  tolerate statin patient to inform us  so we can send prescription for Repatha.

## 2024-10-25 ENCOUNTER — Other Ambulatory Visit (HOSPITAL_COMMUNITY): Payer: Self-pay

## 2024-11-12 ENCOUNTER — Other Ambulatory Visit: Payer: Self-pay

## 2024-11-12 ENCOUNTER — Other Ambulatory Visit (INDEPENDENT_AMBULATORY_CARE_PROVIDER_SITE_OTHER)

## 2024-11-12 ENCOUNTER — Ambulatory Visit: Payer: Self-pay | Admitting: Family Medicine

## 2024-11-12 DIAGNOSIS — R809 Proteinuria, unspecified: Secondary | ICD-10-CM

## 2024-11-12 LAB — MICROALBUMIN / CREATININE URINE RATIO
Creatinine,U: 436.2 mg/dL
Microalb Creat Ratio: 114.4 mg/g — ABNORMAL HIGH (ref 0.0–30.0)
Microalb, Ur: 49.9 mg/dL — ABNORMAL HIGH (ref 0.0–1.9)

## 2024-11-14 ENCOUNTER — Other Ambulatory Visit (HOSPITAL_COMMUNITY): Payer: Self-pay

## 2024-11-20 ENCOUNTER — Encounter: Payer: Self-pay | Admitting: Family Medicine

## 2024-11-20 ENCOUNTER — Ambulatory Visit: Admitting: Family Medicine

## 2024-11-20 VITALS — BP 128/74 | HR 74 | Temp 98.0°F | Resp 16 | Ht 74.0 in | Wt 227.0 lb

## 2024-11-20 DIAGNOSIS — R195 Other fecal abnormalities: Secondary | ICD-10-CM | POA: Diagnosis not present

## 2024-11-20 NOTE — Progress Notes (Signed)
 Chief Complaint  Patient presents with   Diarrhea    Diarrhea     George Carr is 68 y.o. male here for complaint of diarrhea.  Duration: 3 weeks; affects him Mon night thru Th and then resolves. Has been recurrent. Takes Mounjaro  Sun.  Some extra stress with wife's short term struggling health.  Abdominal pain? No Bleeding? No Recent travel? No Recent antibiotic use? No Sick contacts? No Fevers? No Dietary change? 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)    Obesity, unspecified    Sleep apnea    Type II or unspecified type diabetes mellitus without mention of complication, not stated as uncontrolled     BP 128/74 (BP Location: Left Arm, Patient Position: Sitting)   Pulse 74   Temp 98 F (36.7 C) (Oral)   Resp 16   Ht 6' 2 (1.88 m)   Wt 227 lb (103 kg)   SpO2 100%   BMI 29.15 kg/m  Gen: awake, alert, appearing stated age HEENT: MMM Heart: RRR, no LE edema Lungs: CTAB, no accessory muscle use Abd: BS+, soft, TTP, non-distended, no masses or organomegaly Psych: Age appropriate judgment and insight  Loose stools  Suspect combo of stress w Mounjaro . He will push back his dosage 1 d and see if his symptoms also get pushed back. Fiber supp rec'd. Imodium prn OK. Encouraged adequate hydration with water and electrolyte replacement. F/u prn. The patient voiced understanding and agreement to the plan.  Mabel Mt [place], DO 11/20/2024 11:14 AM

## 2024-11-20 NOTE — Patient Instructions (Addendum)
 Take Metamucil or Benefiber daily.  As long as you are vomiting or having diarrhea, please drink fluids with electrolytes like Gatorade, Powerade, or Pedialyte (if you can stomach the taste). Drink these along with water mainly.   Wait an extra day to take Mounjaro  to see if it pushes back your symptoms another day.   OK to use Imodium as needed.   Let us  know if you need anything.

## 2024-12-23 ENCOUNTER — Other Ambulatory Visit

## 2024-12-26 ENCOUNTER — Other Ambulatory Visit: Payer: Self-pay | Admitting: Family Medicine

## 2024-12-27 ENCOUNTER — Ambulatory Visit: Payer: Self-pay | Admitting: Family Medicine

## 2024-12-27 ENCOUNTER — Other Ambulatory Visit (INDEPENDENT_AMBULATORY_CARE_PROVIDER_SITE_OTHER)

## 2024-12-27 DIAGNOSIS — R809 Proteinuria, unspecified: Secondary | ICD-10-CM | POA: Diagnosis not present

## 2024-12-27 LAB — MICROALBUMIN / CREATININE URINE RATIO
Creatinine,U: 226.2 mg/dL
Microalb Creat Ratio: 21.1 mg/g (ref 0.0–30.0)
Microalb, Ur: 4.8 mg/dL — ABNORMAL HIGH (ref 0.7–1.9)

## 2025-01-03 ENCOUNTER — Other Ambulatory Visit: Payer: Self-pay

## 2025-01-03 MED ORDER — ACCU-CHEK GUIDE ME W/DEVICE KIT: PACK | 5 refills | Status: AC

## 2025-02-17 ENCOUNTER — Ambulatory Visit: Admitting: Family Medicine

## 2025-03-04 ENCOUNTER — Ambulatory Visit (HOSPITAL_COMMUNITY): Admitting: Cardiology

## 2025-04-22 ENCOUNTER — Ambulatory Visit
